# Patient Record
Sex: Male | Born: 1950
Health system: Southern US, Community
[De-identification: ages and names within clinical notes are randomized; demographics above are authoritative.]

## PROBLEM LIST (undated history)

## (undated) DIAGNOSIS — C61 Malignant neoplasm of prostate: Secondary | ICD-10-CM

## (undated) DIAGNOSIS — Z923 Personal history of irradiation: Secondary | ICD-10-CM

## (undated) DIAGNOSIS — N529 Male erectile dysfunction, unspecified: Secondary | ICD-10-CM

## (undated) DIAGNOSIS — E039 Hypothyroidism, unspecified: Secondary | ICD-10-CM

## (undated) DIAGNOSIS — Z951 Presence of aortocoronary bypass graft: Secondary | ICD-10-CM

## (undated) DIAGNOSIS — M1990 Unspecified osteoarthritis, unspecified site: Secondary | ICD-10-CM

## (undated) DIAGNOSIS — I5189 Other ill-defined heart diseases: Secondary | ICD-10-CM

## (undated) DIAGNOSIS — N393 Stress incontinence (female) (male): Secondary | ICD-10-CM

## (undated) DIAGNOSIS — R519 Headache, unspecified: Secondary | ICD-10-CM

## (undated) DIAGNOSIS — K219 Gastro-esophageal reflux disease without esophagitis: Secondary | ICD-10-CM

## (undated) DIAGNOSIS — K5790 Diverticulosis of intestine, part unspecified, without perforation or abscess without bleeding: Secondary | ICD-10-CM

## (undated) DIAGNOSIS — N200 Calculus of kidney: Secondary | ICD-10-CM

## (undated) DIAGNOSIS — Z8719 Personal history of other diseases of the digestive system: Secondary | ICD-10-CM

## (undated) DIAGNOSIS — K635 Polyp of colon: Secondary | ICD-10-CM

## (undated) DIAGNOSIS — R011 Cardiac murmur, unspecified: Secondary | ICD-10-CM

## (undated) DIAGNOSIS — I10 Essential (primary) hypertension: Secondary | ICD-10-CM

## (undated) DIAGNOSIS — Z973 Presence of spectacles and contact lenses: Secondary | ICD-10-CM

## (undated) DIAGNOSIS — R351 Nocturia: Secondary | ICD-10-CM

## (undated) DIAGNOSIS — E785 Hyperlipidemia, unspecified: Secondary | ICD-10-CM

## (undated) DIAGNOSIS — J189 Pneumonia, unspecified organism: Secondary | ICD-10-CM

## (undated) DIAGNOSIS — Z7982 Long term (current) use of aspirin: Secondary | ICD-10-CM

## (undated) DIAGNOSIS — U071 COVID-19: Secondary | ICD-10-CM

## (undated) DIAGNOSIS — Z952 Presence of prosthetic heart valve: Secondary | ICD-10-CM

## (undated) DIAGNOSIS — I350 Nonrheumatic aortic (valve) stenosis: Secondary | ICD-10-CM

## (undated) DIAGNOSIS — I700 Atherosclerosis of aorta: Secondary | ICD-10-CM

## (undated) DIAGNOSIS — I7122 Aneurysm of the aortic arch, without rupture: Secondary | ICD-10-CM

## (undated) DIAGNOSIS — I2510 Atherosclerotic heart disease of native coronary artery without angina pectoris: Secondary | ICD-10-CM

## (undated) DIAGNOSIS — T8859XA Other complications of anesthesia, initial encounter: Secondary | ICD-10-CM

## (undated) HISTORY — PX: TONSILLECTOMY: SUR1361

## (undated) HISTORY — PX: COLONOSCOPY: SHX174

## (undated) HISTORY — PX: OTHER SURGICAL HISTORY: SHX169

## (undated) HISTORY — DX: Hypothyroidism, unspecified: E03.9

## (undated) HISTORY — DX: Hyperlipidemia, unspecified: E78.5

## (undated) HISTORY — PX: TONSILLECTOMY AND ADENOIDECTOMY: SUR1326

---
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
## 1981-02-09 HISTORY — PX: VARICOCELECTOMY: SHX1084

## 2004-08-28 ENCOUNTER — Emergency Department (HOSPITAL_COMMUNITY): Admission: EM | Admit: 2004-08-28 | Discharge: 2004-08-28 | Payer: Self-pay | Admitting: Emergency Medicine

## 2006-09-06 ENCOUNTER — Ambulatory Visit: Payer: Self-pay | Admitting: Gastroenterology

## 2012-02-10 DIAGNOSIS — I350 Nonrheumatic aortic (valve) stenosis: Secondary | ICD-10-CM

## 2012-02-10 HISTORY — DX: Nonrheumatic aortic (valve) stenosis: I35.0

## 2012-08-23 DIAGNOSIS — I2510 Atherosclerotic heart disease of native coronary artery without angina pectoris: Secondary | ICD-10-CM

## 2012-08-23 HISTORY — DX: Atherosclerotic heart disease of native coronary artery without angina pectoris: I25.10

## 2013-06-13 ENCOUNTER — Ambulatory Visit (INDEPENDENT_AMBULATORY_CARE_PROVIDER_SITE_OTHER): Payer: Managed Care, Other (non HMO) | Admitting: Urology

## 2013-06-13 DIAGNOSIS — R972 Elevated prostate specific antigen [PSA]: Secondary | ICD-10-CM

## 2013-06-13 DIAGNOSIS — N529 Male erectile dysfunction, unspecified: Secondary | ICD-10-CM

## 2013-07-04 ENCOUNTER — Other Ambulatory Visit: Payer: Self-pay | Admitting: Urology

## 2013-07-04 DIAGNOSIS — R972 Elevated prostate specific antigen [PSA]: Secondary | ICD-10-CM

## 2013-07-14 ENCOUNTER — Ambulatory Visit: Payer: Self-pay | Admitting: Gastroenterology

## 2013-07-17 LAB — PATHOLOGY REPORT

## 2013-08-01 ENCOUNTER — Ambulatory Visit (HOSPITAL_COMMUNITY)
Admission: RE | Admit: 2013-08-01 | Discharge: 2013-08-01 | Disposition: A | Discharge status: Home / Self Care | Payer: Managed Care, Other (non HMO) | Source: Ambulatory Visit | Attending: Urology | Admitting: Urology

## 2013-08-01 ENCOUNTER — Encounter (HOSPITAL_COMMUNITY): Payer: Self-pay

## 2013-08-01 ENCOUNTER — Other Ambulatory Visit: Payer: Self-pay | Admitting: Urology

## 2013-08-01 DIAGNOSIS — R972 Elevated prostate specific antigen [PSA]: Secondary | ICD-10-CM

## 2013-08-01 DIAGNOSIS — C61 Malignant neoplasm of prostate: Secondary | ICD-10-CM

## 2013-08-01 HISTORY — DX: Essential (primary) hypertension: I10

## 2013-08-01 HISTORY — DX: Malignant neoplasm of prostate: C61

## 2013-08-01 MED ORDER — GENTAMICIN SULFATE 40 MG/ML IJ SOLN
160.0000 mg | Freq: Once | INTRAMUSCULAR | Status: AC
  Administered 2013-08-01: 160 mg via INTRAMUSCULAR
  Filled 2013-08-01: qty 4

## 2013-08-01 MED ORDER — LIDOCAINE HCL (PF) 2 % IJ SOLN
10.0000 mL | Freq: Once | INTRAMUSCULAR | Status: AC
  Administered 2013-08-01: 10 mL

## 2013-08-01 MED ORDER — GENTAMICIN SULFATE 40 MG/ML IJ SOLN
INTRAMUSCULAR | Status: AC
  Administered 2013-08-01: 160 mg via INTRAMUSCULAR
  Filled 2013-08-01: qty 4

## 2013-08-01 MED ORDER — LIDOCAINE HCL (PF) 2 % IJ SOLN
INTRAMUSCULAR | Status: AC
  Administered 2013-08-01: 10 mL
  Filled 2013-08-01: qty 10

## 2013-08-01 NOTE — Progress Notes (Signed)
Biopsy complete no signs of distress  

## 2013-08-01 NOTE — Discharge Instructions (Signed)
Transrectal Ultrasound-Guided Biopsy °A transrectal ultrasound-guided biopsy is a procedure to take samples of tissue from your prostate. Ultrasound images are used to guide the procedure. It is usually done to check the prostate gland for cancer. °BEFORE THE PROCEDURE °· Do not eat or drink after midnight on the night before your procedure. °· Take medicines as your doctor tells you. °· Your doctor may have you stop taking some medicines 5-7 days before the procedure. °· You will be given an enema before your procedure. During an enema, a liquid is put into your butt (rectum) to clear out waste. °· You may have lab tests the day of your procedure. °· Make plans to have someone drive you home. °PROCEDURE °· You will be given medicine to help you relax before the procedure. An IV tube will be put into one of your veins. It will be used to give fluids and medicine. °· You will be given medicine to reduce the risk of infection (antibiotic). °· You will be placed on your side. °· A probe with gel will be put in your butt. This is used to take pictures of your prostate and the area around it. °· A medicine to numb the area is put into your prostate. °· A biopsy needle is then inserted and guided to your prostate. °· Samples of prostate tissue are taken. The needle is removed. °· The samples are sent to a lab to be checked. Results are usually back in 2-3 days. °AFTER THE PROCEDURE °· You will be taken to a room where you will be watched until you are doing okay. °· You may have some pain in the area around your butt. You will be given medicines for this. °· You may be able to go home the same day. Sometimes, an overnight stay in the hospital is needed. °Document Released: 01/14/2009 Document Revised: 01/31/2013 Document Reviewed: 09/14/2012 °ExitCare® Patient Information ©2015 [employer]. This information is not intended to replace advice given to you by your health care provider. Make sure you discuss any questions  you have with your health care provider. ° °

## 2013-08-08 ENCOUNTER — Encounter: Payer: Self-pay | Admitting: Urology

## 2013-09-14 ENCOUNTER — Ambulatory Visit: Payer: Managed Care, Other (non HMO)

## 2013-09-14 ENCOUNTER — Ambulatory Visit: Payer: Managed Care, Other (non HMO) | Admitting: Radiation Oncology

## 2013-09-21 ENCOUNTER — Other Ambulatory Visit: Payer: Self-pay | Admitting: Urology

## 2013-10-09 ENCOUNTER — Encounter (HOSPITAL_COMMUNITY): Payer: Self-pay | Admitting: Pharmacy Technician

## 2013-10-11 NOTE — Patient Instructions (Addendum)
Danny Odom  10/11/2013   Your procedure is scheduled on:  10/20/2013    Report to [facility].  Follow the Signs to [place] at    0630    am  Call this number if you have problems the morning of surgery: [phone]   Remember:   Do not eat food or drink liquids after midnight.   Take these medicines the morning of surgery with A SIP OF WATER: none    Do not wear jewelry,   Do not wear lotions, powders, or perfumes. , deodorant.    . Men may shave face and neck.  Do not bring valuables to the hospital.  Contacts, dentures or bridgework may not be worn into surgery.  Leave suitcase in the car. After surgery it may be brought to your room.  For patients admitted to the hospital, checkout time is 11:00 AM the day of  discharge.           Please read over the following fact sheets that you were given: [facility] - Preparing for Surgery Before surgery, you can play an important role.  Because skin is not sterile, your skin needs to be as free of germs as possible.  You can reduce the number of germs on your skin by washing with CHG (chlorahexidine gluconate) soap before surgery.  CHG is an antiseptic cleaner which kills germs and bonds with the skin to continue killing germs even after washing. Please DO NOT use if you have an allergy to CHG or antibacterial soaps.  If your skin becomes reddened/irritated stop using the CHG and inform your nurse when you arrive at Short Stay. Do not shave (including legs and underarms) for at least 48 hours prior to the first CHG shower.  You may shave your face/neck. Please follow these instructions carefully:  1.  Shower with CHG Soap the night before surgery and the  morning of Surgery.  2.  If you choose to wash your hair, wash your hair first as usual with your  normal  shampoo.  3.  After you shampoo, rinse your hair and body thoroughly to remove the  shampoo.                           4.  Use CHG as you would any other liquid  soap.  You can apply chg directly  to the skin and wash                       Gently with a scrungie or clean washcloth.  5.  Apply the CHG Soap to your body ONLY FROM THE NECK DOWN.   Do not use on face/ open                           Wound or open sores. Avoid contact with eyes, ears mouth and genitals (private parts).                       Wash face,  Genitals (private parts) with your normal soap.             6.  Wash thoroughly, paying special attention to the area where your surgery  will be performed.  7.  Thoroughly rinse your body with warm water from the neck down.  8.  DO NOT shower/wash with your normal soap  after using and rinsing off  the CHG Soap.                9.  Pat yourself dry with a clean towel.            10.  Wear clean pajamas.            11.  Place clean sheets on your bed the night of your first shower and do not  sleep with pets. Day of Surgery : Do not apply any lotions/deodorants the morning of surgery.  Please wear clean clothes to the hospital/surgery center.  FAILURE TO FOLLOW THESE INSTRUCTIONS MAY RESULT IN THE CANCELLATION OF YOUR SURGERY PATIENT SIGNATURE_________________________________  NURSE SIGNATURE__________________________________  ________________________________________________________________________  WHAT IS A BLOOD TRANSFUSION? Blood Transfusion Information  A transfusion is the replacement of blood or some of its parts. Blood is made up of multiple cells which provide different functions.  Red blood cells carry oxygen and are used for blood loss replacement.  White blood cells fight against infection.  Platelets control bleeding.  Plasma helps clot blood.  Other blood products are available for specialized needs, such as hemophilia or other clotting disorders. BEFORE THE TRANSFUSION  Who gives blood for transfusions?   Healthy volunteers who are fully evaluated to make sure their blood is safe. This is blood bank  blood. Transfusion therapy is the safest it has ever been in the practice of medicine. Before blood is taken from a donor, a complete history is taken to make sure that person has no history of diseases nor engages in risky social behavior (examples are intravenous drug use or sexual activity with multiple partners). The donor's travel history is screened to minimize risk of transmitting infections, such as malaria. The donated blood is tested for signs of infectious diseases, such as HIV and hepatitis. The blood is then tested to be sure it is compatible with you in order to minimize the chance of a transfusion reaction. If you or a relative donates blood, this is often done in anticipation of surgery and is not appropriate for emergency situations. It takes many days to process the donated blood. RISKS AND COMPLICATIONS Although transfusion therapy is very safe and saves many lives, the main dangers of transfusion include:   Getting an infectious disease.  Developing a transfusion reaction. This is an allergic reaction to something in the blood you were given. Every precaution is taken to prevent this. The decision to have a blood transfusion has been considered carefully by your caregiver before blood is given. Blood is not given unless the benefits outweigh the risks. AFTER THE TRANSFUSION  Right after receiving a blood transfusion, you will usually feel much better and more energetic. This is especially true if your red blood cells have gotten low (anemic). The transfusion raises the level of the red blood cells which carry oxygen, and this usually causes an energy increase.  The nurse administering the transfusion will monitor you carefully for complications. HOME CARE INSTRUCTIONS  No special instructions are needed after a transfusion. You may find your energy is better. Speak with your caregiver about any limitations on activity for underlying diseases you may have. SEEK MEDICAL CARE IF:    Your condition is not improving after your transfusion.  You develop redness or irritation at the intravenous (IV) site. SEEK IMMEDIATE MEDICAL CARE IF:  Any of the following symptoms occur over the next 12 hours:  Shaking chills.  You have a temperature by mouth above 102  F (38.9 C), not controlled by medicine.  Chest, back, or muscle pain.  People around you feel you are not acting correctly or are confused.  Shortness of breath or difficulty breathing.  Dizziness and fainting.  You get a rash or develop hives.  You have a decrease in urine output.  Your urine turns a dark color or changes to pink, red, or brown. Any of the following symptoms occur over the next 10 days:  You have a temperature by mouth above 102 F (38.9 C), not controlled by medicine.  Shortness of breath.  Weakness after normal activity.  The white part of the eye turns yellow (jaundice).  You have a decrease in the amount of urine or are urinating less often.  Your urine turns a dark color or changes to pink, red, or brown. Document Released: 01/24/2000 Document Revised: 04/20/2011 Document Reviewed: 09/12/2007 ExitCare Patient Information 2014 [place].  _______________________________________________________________________, coughing and deep breathing exercises, leg exercises

## 2013-10-12 ENCOUNTER — Encounter (HOSPITAL_COMMUNITY)
Admission: RE | Admit: 2013-10-12 | Discharge: 2013-10-12 | Disposition: A | Discharge status: Home / Self Care | Payer: Managed Care, Other (non HMO) | Source: Ambulatory Visit | Attending: Urology | Admitting: Urology

## 2013-10-12 ENCOUNTER — Ambulatory Visit (HOSPITAL_COMMUNITY)
Admission: RE | Admit: 2013-10-12 | Discharge: 2013-10-12 | Disposition: A | Discharge status: Home / Self Care | Payer: Managed Care, Other (non HMO) | Source: Ambulatory Visit | Attending: Anesthesiology | Admitting: Anesthesiology

## 2013-10-12 ENCOUNTER — Encounter (INDEPENDENT_AMBULATORY_CARE_PROVIDER_SITE_OTHER): Payer: Self-pay

## 2013-10-12 ENCOUNTER — Encounter (HOSPITAL_COMMUNITY): Payer: Self-pay

## 2013-10-12 DIAGNOSIS — Z87891 Personal history of nicotine dependence: Secondary | ICD-10-CM | POA: Insufficient documentation

## 2013-10-12 DIAGNOSIS — I10 Essential (primary) hypertension: Secondary | ICD-10-CM | POA: Diagnosis present

## 2013-10-12 DIAGNOSIS — Z01818 Encounter for other preprocedural examination: Secondary | ICD-10-CM | POA: Insufficient documentation

## 2013-10-12 HISTORY — DX: Nonrheumatic aortic (valve) stenosis: I35.0

## 2013-10-12 HISTORY — DX: Unspecified osteoarthritis, unspecified site: M19.90

## 2013-10-12 LAB — BASIC METABOLIC PANEL
Anion gap: 12 (ref 5–15)
BUN: 21 mg/dL (ref 6–23)
CO2: 28 meq/L (ref 19–32)
Calcium: 10 mg/dL (ref 8.4–10.5)
Chloride: 103 mEq/L (ref 96–112)
Creatinine, Ser: 1.37 mg/dL — ABNORMAL HIGH (ref 0.50–1.35)
GFR calc Af Amer: 61 mL/min — ABNORMAL LOW (ref >90)
GFR calc non Af Amer: 53 mL/min — ABNORMAL LOW (ref >90)
GLUCOSE: 92 mg/dL (ref 70–99)
POTASSIUM: 5.2 meq/L (ref 3.7–5.3)
SODIUM: 143 meq/L (ref 137–147)

## 2013-10-12 LAB — CBC
HCT: 45 % (ref 39.0–52.0)
HEMOGLOBIN: 14.8 g/dL (ref 13.0–17.0)
MCH: 31.2 pg (ref 26.0–34.0)
MCHC: 32.9 g/dL (ref 30.0–36.0)
MCV: 94.9 fL (ref 78.0–100.0)
Platelets: 214 10*3/uL (ref 150–400)
RBC: 4.74 MIL/uL (ref 4.22–5.81)
RDW: 13.2 % (ref 11.5–15.5)
WBC: 7.2 10*3/uL (ref 4.0–10.5)

## 2013-10-12 NOTE — Progress Notes (Signed)
Rerequested EKG from Dr Emily Filbert office by fax.

## 2013-10-12 NOTE — Progress Notes (Signed)
LOV Dr Emily Filbert 09/25/13 on chart  LOV Dr Jenne Pane [facility]- 05/2012 in [place]

## 2013-10-12 NOTE — Progress Notes (Signed)
CXR sent via EPIC fax to Dr Tresa Moore.

## 2013-10-12 NOTE — Progress Notes (Signed)
Requested ekg from [facility] since patient states Dr Tresa Moore has a copy.  Left message for [employer].  Also called and left message for medical records department of Dr Emily Filbert and requested copy of ekg be faxed to [phone].  Also requested by fax this am.

## 2013-10-12 NOTE — Progress Notes (Signed)
Faxed via EPIC BMP results. To Dr Tresa Moore

## 2013-10-13 ENCOUNTER — Ambulatory Visit (HOSPITAL_COMMUNITY)
Admission: RE | Admit: 2013-10-13 | Discharge: 2013-10-13 | Disposition: A | Discharge status: Home / Self Care | Payer: Managed Care, Other (non HMO) | Source: Ambulatory Visit | Attending: Urology | Admitting: Urology

## 2013-10-13 ENCOUNTER — Other Ambulatory Visit (HOSPITAL_COMMUNITY): Payer: Self-pay | Admitting: Urology

## 2013-10-13 DIAGNOSIS — C61 Malignant neoplasm of prostate: Secondary | ICD-10-CM

## 2013-10-13 NOTE — Progress Notes (Signed)
ekg [facility] system 09-25-13 on chart

## 2013-10-19 NOTE — Anesthesia Preprocedure Evaluation (Addendum)
Anesthesia Evaluation  Patient identified by MRN, date of birth, ID band Patient awake    Reviewed: Allergy & Precautions, H&P , NPO status , Patient's Chart, lab work & pertinent test results  Airway Mallampati: II TM Distance: >3 FB Neck ROM: Full    Dental no notable dental hx.    Pulmonary neg pulmonary ROS, former smoker,  breath sounds clear to auscultation  Pulmonary exam normal       Cardiovascular hypertension, Pt. on medications + Valvular Problems/Murmurs AS Rhythm:Regular Rate:Bradycardia + Systolic murmurs EF normal. No SOB, syncope, or angina   Neuro/Psych negative neurological ROS  negative psych ROS   GI/Hepatic negative GI ROS, Neg liver ROS,   Endo/Other  negative endocrine ROS  Renal/GU negative Renal ROS  negative genitourinary   Musculoskeletal negative musculoskeletal ROS (+)   Abdominal   Peds negative pediatric ROS (+)  Hematology negative hematology ROS (+)   Anesthesia Other Findings   Reproductive/Obstetrics negative OB ROS                        Anesthesia Physical Anesthesia Plan  ASA: III  Anesthesia Plan: General   Post-op Pain Management:    Induction: Intravenous  Airway Management Planned: Oral ETT  Additional Equipment:   Intra-op Plan:   Post-operative Plan: Extubation in OR  Informed Consent: I have reviewed the patients History and Physical, chart, labs and discussed the procedure including the risks, benefits and alternatives for the proposed anesthesia with the patient or authorized representative who has indicated his/her understanding and acceptance.   Dental advisory given  Plan Discussed with: CRNA  Anesthesia Plan Comments:        Anesthesia Quick Evaluation

## 2013-10-20 ENCOUNTER — Encounter (HOSPITAL_COMMUNITY): Payer: Managed Care, Other (non HMO) | Admitting: Anesthesiology

## 2013-10-20 ENCOUNTER — Inpatient Hospital Stay (HOSPITAL_COMMUNITY)
Admission: RE | Admit: 2013-10-20 | Discharge: 2013-10-21 | DRG: 708 | Disposition: A | Discharge status: Home / Self Care | Payer: Managed Care, Other (non HMO) | Source: Ambulatory Visit | Attending: Urology | Admitting: Urology

## 2013-10-20 ENCOUNTER — Encounter (HOSPITAL_COMMUNITY)
Admission: RE | Disposition: A | Discharge status: Home / Self Care | Payer: Self-pay | Source: Ambulatory Visit | Attending: Urology

## 2013-10-20 ENCOUNTER — Inpatient Hospital Stay (HOSPITAL_COMMUNITY): Payer: Managed Care, Other (non HMO) | Admitting: Anesthesiology

## 2013-10-20 ENCOUNTER — Encounter (HOSPITAL_COMMUNITY): Payer: Self-pay | Admitting: *Deleted

## 2013-10-20 DIAGNOSIS — I10 Essential (primary) hypertension: Secondary | ICD-10-CM | POA: Diagnosis present

## 2013-10-20 DIAGNOSIS — I359 Nonrheumatic aortic valve disorder, unspecified: Secondary | ICD-10-CM | POA: Diagnosis present

## 2013-10-20 DIAGNOSIS — Z87891 Personal history of nicotine dependence: Secondary | ICD-10-CM | POA: Diagnosis not present

## 2013-10-20 DIAGNOSIS — C61 Malignant neoplasm of prostate: Principal | ICD-10-CM | POA: Diagnosis present

## 2013-10-20 HISTORY — PX: ROBOT ASSISTED LAPAROSCOPIC RADICAL PROSTATECTOMY: SHX5141

## 2013-10-20 HISTORY — PX: LYMPHADENECTOMY: SHX5960

## 2013-10-20 LAB — HEMOGLOBIN AND HEMATOCRIT, BLOOD
HCT: 41.6 % (ref 39.0–52.0)
HEMOGLOBIN: 13.6 g/dL (ref 13.0–17.0)

## 2013-10-20 LAB — TYPE AND SCREEN
ABO/RH(D): O POS
Antibody Screen: NEGATIVE

## 2013-10-20 LAB — ABO/RH: ABO/RH(D): O POS

## 2013-10-20 SURGERY — ROBOTIC ASSISTED LAPAROSCOPIC RADICAL PROSTATECTOMY
Anesthesia: General

## 2013-10-20 MED ORDER — PROPOFOL 10 MG/ML IV BOLUS
INTRAVENOUS | Status: AC
  Filled 2013-10-20: qty 20

## 2013-10-20 MED ORDER — NEOSTIGMINE METHYLSULFATE 10 MG/10ML IV SOLN
INTRAVENOUS | Status: DC | PRN
  Administered 2013-10-20: 4 mg via INTRAVENOUS

## 2013-10-20 MED ORDER — AMLODIPINE BESYLATE 2.5 MG PO TABS
2.5000 mg | ORAL_TABLET | Freq: Every day | ORAL | Status: DC
  Administered 2013-10-20 – 2013-10-21 (×2): 2.5 mg via ORAL
  Filled 2013-10-20 (×2): qty 1

## 2013-10-20 MED ORDER — PROPOFOL 10 MG/ML IV BOLUS
INTRAVENOUS | Status: DC | PRN
  Administered 2013-10-20: 200 mg via INTRAVENOUS

## 2013-10-20 MED ORDER — BUPIVACAINE LIPOSOME 1.3 % IJ SUSP
20.0000 mL | Freq: Once | INTRAMUSCULAR | Status: AC
  Administered 2013-10-20: 16 mL
  Filled 2013-10-20: qty 20

## 2013-10-20 MED ORDER — HYDROMORPHONE HCL PF 1 MG/ML IJ SOLN: 0.5000 mg | INTRAMUSCULAR | Status: DC | PRN

## 2013-10-20 MED ORDER — ONDANSETRON HCL 4 MG/2ML IJ SOLN
INTRAMUSCULAR | Status: AC
  Filled 2013-10-20: qty 2

## 2013-10-20 MED ORDER — LIDOCAINE HCL (CARDIAC) 20 MG/ML IV SOLN
INTRAVENOUS | Status: DC | PRN
  Administered 2013-10-20: 25 mg via INTRATRACHEAL
  Administered 2013-10-20: 75 mg via INTRAVENOUS

## 2013-10-20 MED ORDER — MIDAZOLAM HCL 5 MG/5ML IJ SOLN
INTRAMUSCULAR | Status: DC | PRN
  Administered 2013-10-20: 2 mg via INTRAVENOUS

## 2013-10-20 MED ORDER — ROCURONIUM BROMIDE 100 MG/10ML IV SOLN
INTRAVENOUS | Status: AC
Start: 2013-10-20 — End: 2013-10-20
  Filled 2013-10-20: qty 1

## 2013-10-20 MED ORDER — MIDAZOLAM HCL 2 MG/2ML IJ SOLN
INTRAMUSCULAR | Status: AC
  Filled 2013-10-20: qty 2

## 2013-10-20 MED ORDER — OXYCODONE-ACETAMINOPHEN 5-325 MG PO TABS: 1.0000 | ORAL_TABLET | ORAL | Status: DC | PRN

## 2013-10-20 MED ORDER — LACTATED RINGERS IR SOLN
Status: DC | PRN
  Administered 2013-10-20: 500 mL

## 2013-10-20 MED ORDER — HYDROCODONE-ACETAMINOPHEN 5-325 MG PO TABS: 1.0000 | ORAL_TABLET | Freq: Four times a day (QID) | ORAL | Status: DC | PRN

## 2013-10-20 MED ORDER — ROCURONIUM BROMIDE 100 MG/10ML IV SOLN
INTRAVENOUS | Status: DC | PRN
  Administered 2013-10-20: 10 mg via INTRAVENOUS
  Administered 2013-10-20: 20 mg via INTRAVENOUS
  Administered 2013-10-20: 70 mg via INTRAVENOUS
  Administered 2013-10-20: 10 mg via INTRAVENOUS

## 2013-10-20 MED ORDER — LIDOCAINE HCL (CARDIAC) 20 MG/ML IV SOLN
INTRAVENOUS | Status: AC
  Filled 2013-10-20: qty 5

## 2013-10-20 MED ORDER — EPHEDRINE SULFATE 50 MG/ML IJ SOLN
INTRAMUSCULAR | Status: DC | PRN
  Administered 2013-10-20: 5 mg via INTRAVENOUS

## 2013-10-20 MED ORDER — HYDROMORPHONE HCL PF 1 MG/ML IJ SOLN: 0.2500 mg | INTRAMUSCULAR | Status: DC | PRN

## 2013-10-20 MED ORDER — LACTATED RINGERS IV SOLN
INTRAVENOUS | Status: DC | PRN
  Administered 2013-10-20 (×2): via INTRAVENOUS

## 2013-10-20 MED ORDER — SODIUM CHLORIDE 0.9 % IJ SOLN
INTRAMUSCULAR | Status: AC
  Filled 2013-10-20: qty 10

## 2013-10-20 MED ORDER — PROMETHAZINE HCL 25 MG/ML IJ SOLN
6.2500 mg | INTRAMUSCULAR | Status: DC | PRN
Start: 2013-10-20 — End: 2013-10-20

## 2013-10-20 MED ORDER — GLYCOPYRROLATE 0.2 MG/ML IJ SOLN
INTRAMUSCULAR | Status: AC
  Filled 2013-10-20: qty 3

## 2013-10-20 MED ORDER — CEFAZOLIN SODIUM-DEXTROSE 2-3 GM-% IV SOLR
INTRAVENOUS | Status: AC
  Filled 2013-10-20: qty 50

## 2013-10-20 MED ORDER — GLYCOPYRROLATE 0.2 MG/ML IJ SOLN
INTRAMUSCULAR | Status: DC | PRN
  Administered 2013-10-20: 0.6 mg via INTRAVENOUS

## 2013-10-20 MED ORDER — ACETAMINOPHEN 10 MG/ML IV SOLN
1000.0000 mg | Freq: Once | INTRAVENOUS | Status: DC
  Filled 2013-10-20: qty 100

## 2013-10-20 MED ORDER — OXYCODONE HCL 5 MG PO TABS: 5.0000 mg | ORAL_TABLET | ORAL | Status: DC | PRN

## 2013-10-20 MED ORDER — KETOROLAC TROMETHAMINE 30 MG/ML IJ SOLN: 15.0000 mg | Freq: Once | INTRAMUSCULAR | Status: DC | PRN

## 2013-10-20 MED ORDER — LACTATED RINGERS IV SOLN
INTRAVENOUS | Status: DC
  Administered 2013-10-20: 1000 mL via INTRAVENOUS

## 2013-10-20 MED ORDER — SUFENTANIL CITRATE 50 MCG/ML IV SOLN
INTRAVENOUS | Status: AC
  Filled 2013-10-20: qty 1

## 2013-10-20 MED ORDER — SODIUM CHLORIDE 0.9 % IV BOLUS (SEPSIS)
1000.0000 mL | Freq: Once | INTRAVENOUS | Status: AC
  Administered 2013-10-20: 1000 mL via INTRAVENOUS

## 2013-10-20 MED ORDER — DEXAMETHASONE SODIUM PHOSPHATE 10 MG/ML IJ SOLN
INTRAMUSCULAR | Status: AC
  Filled 2013-10-20: qty 1

## 2013-10-20 MED ORDER — CEFAZOLIN SODIUM-DEXTROSE 2-3 GM-% IV SOLR
2.0000 g | INTRAVENOUS | Status: AC
  Administered 2013-10-20: 2 g via INTRAVENOUS

## 2013-10-20 MED ORDER — HYDROMORPHONE HCL PF 2 MG/ML IJ SOLN
INTRAMUSCULAR | Status: AC
  Filled 2013-10-20: qty 1

## 2013-10-20 MED ORDER — EPHEDRINE SULFATE 50 MG/ML IJ SOLN
INTRAMUSCULAR | Status: AC
  Filled 2013-10-20: qty 1

## 2013-10-20 MED ORDER — BENAZEPRIL HCL 10 MG PO TABS
10.0000 mg | ORAL_TABLET | Freq: Every day | ORAL | Status: DC
  Administered 2013-10-20 – 2013-10-21 (×2): 10 mg via ORAL
  Filled 2013-10-20 (×2): qty 1

## 2013-10-20 MED ORDER — NEOSTIGMINE METHYLSULFATE 10 MG/10ML IV SOLN
INTRAVENOUS | Status: AC
  Filled 2013-10-20: qty 1

## 2013-10-20 MED ORDER — CIPROFLOXACIN HCL 500 MG PO TABS: 500.0000 mg | ORAL_TABLET | Freq: Two times a day (BID) | ORAL | Status: DC

## 2013-10-20 MED ORDER — ACETAMINOPHEN 10 MG/ML IV SOLN
INTRAVENOUS | Status: DC | PRN
  Administered 2013-10-20: 1000 mg via INTRAVENOUS

## 2013-10-20 MED ORDER — AMLODIPINE BESY-BENAZEPRIL HCL 2.5-10 MG PO CAPS: 1.0000 | ORAL_CAPSULE | Freq: Every morning | ORAL | Status: DC

## 2013-10-20 MED ORDER — INFLUENZA VAC SPLIT QUAD 0.5 ML IM SUSY
0.5000 mL | PREFILLED_SYRINGE | INTRAMUSCULAR | Status: AC
  Administered 2013-10-21: 0.5 mL via INTRAMUSCULAR
  Filled 2013-10-20 (×2): qty 0.5

## 2013-10-20 MED ORDER — HYDROMORPHONE HCL PF 1 MG/ML IJ SOLN
INTRAMUSCULAR | Status: DC | PRN
  Administered 2013-10-20 (×2): 1 mg via INTRAVENOUS

## 2013-10-20 MED ORDER — DEXTROSE-NACL 5-0.45 % IV SOLN
INTRAVENOUS | Status: DC
  Administered 2013-10-20: 20:00:00 via INTRAVENOUS
  Administered 2013-10-20: 1000 mL via INTRAVENOUS
  Administered 2013-10-21 (×2): via INTRAVENOUS

## 2013-10-20 MED ORDER — ACETAMINOPHEN 500 MG PO TABS
1000.0000 mg | ORAL_TABLET | Freq: Four times a day (QID) | ORAL | Status: DC
  Administered 2013-10-20 – 2013-10-21 (×3): 1000 mg via ORAL
  Filled 2013-10-20 (×6): qty 2

## 2013-10-20 MED ORDER — DEXAMETHASONE SODIUM PHOSPHATE 10 MG/ML IJ SOLN
INTRAMUSCULAR | Status: DC | PRN
  Administered 2013-10-20: 10 mg via INTRAVENOUS

## 2013-10-20 MED ORDER — SUFENTANIL CITRATE 50 MCG/ML IV SOLN
INTRAVENOUS | Status: DC | PRN
  Administered 2013-10-20: 10 ug via INTRAVENOUS
  Administered 2013-10-20: 15 ug via INTRAVENOUS
  Administered 2013-10-20 (×2): 10 ug via INTRAVENOUS
  Administered 2013-10-20 (×2): 5 ug via INTRAVENOUS
  Administered 2013-10-20 (×2): 10 ug via INTRAVENOUS
  Administered 2013-10-20: 5 ug via INTRAVENOUS

## 2013-10-20 SURGICAL SUPPLY — 53 items
ADH SKN CLS APL DERMABOND .7 (GAUZE/BANDAGES/DRESSINGS) ×2
CABLE HIGH FREQUENCY MONO STRZ (ELECTRODE) ×3 IMPLANT
CANISTER SUCTION 2500CC (MISCELLANEOUS) ×2 IMPLANT
CATH FOLEY 2WAY SLVR 18FR 30CC (CATHETERS) ×3 IMPLANT
CATH TIEMANN FOLEY 18FR 5CC (CATHETERS) ×3 IMPLANT
CHLORAPREP W/TINT 26ML (MISCELLANEOUS) ×3 IMPLANT
CLIP LIGATING HEM O LOK PURPLE (MISCELLANEOUS) ×8 IMPLANT
CLOTH BEACON ORANGE TIMEOUT ST (SAFETY) ×3 IMPLANT
COVER SURGICAL LIGHT HANDLE (MISCELLANEOUS) ×3 IMPLANT
COVER TIP SHEARS 8 DVNC (MISCELLANEOUS) ×2 IMPLANT
COVER TIP SHEARS 8MM DA VINCI (MISCELLANEOUS) ×1
CUTTER ECHEON FLEX ENDO 45 340 (ENDOMECHANICALS) ×3 IMPLANT
DECANTER SPIKE VIAL GLASS SM (MISCELLANEOUS) ×3 IMPLANT
DERMABOND ADVANCED (GAUZE/BANDAGES/DRESSINGS) ×1
DERMABOND ADVANCED .7 DNX12 (GAUZE/BANDAGES/DRESSINGS) ×2 IMPLANT
DRSG TEGADERM 4X4.75 (GAUZE/BANDAGES/DRESSINGS) ×6 IMPLANT
DRSG TEGADERM 6X8 (GAUZE/BANDAGES/DRESSINGS) ×4 IMPLANT
ELECT REM PT RETURN 9FT ADLT (ELECTROSURGICAL) ×3
ELECTRODE REM PT RTRN 9FT ADLT (ELECTROSURGICAL) ×2 IMPLANT
GAUZE SPONGE 2X2 8PLY STRL LF (GAUZE/BANDAGES/DRESSINGS) ×2 IMPLANT
GLOVE BIO SURGEON STRL SZ 6.5 (GLOVE) ×3 IMPLANT
GLOVE BIOGEL M STRL SZ7.5 (GLOVE) ×6 IMPLANT
GLOVE BIOGEL PI IND STRL 7.5 (GLOVE) IMPLANT
GLOVE BIOGEL PI INDICATOR 7.5 (GLOVE) ×1
GOWN STRL REUS W/TWL LRG LVL4 (GOWN DISPOSABLE) ×9 IMPLANT
HOLDER FOLEY CATH W/STRAP (MISCELLANEOUS) ×3 IMPLANT
IV LACTATED RINGERS 1000ML (IV SOLUTION) ×3 IMPLANT
KIT ACCESSORY DA VINCI DISP (KITS) ×1
KIT ACCESSORY DVNC DISP (KITS) IMPLANT
KIT PROCEDURE DA VINCI SI (MISCELLANEOUS) ×1
KIT PROCEDURE DVNC SI (MISCELLANEOUS) ×2 IMPLANT
NDL INSUFFLATION 14GA 120MM (NEEDLE) ×2 IMPLANT
NEEDLE INSUFFLATION 14GA 120MM (NEEDLE) ×3 IMPLANT
NEEDLE SPNL 22GX7 SPINOC (NEEDLE) IMPLANT
PACK ROBOT UROLOGY CUSTOM (CUSTOM PROCEDURE TRAY) ×3 IMPLANT
RELOAD GREEN ECHELON 45 (STAPLE) ×3 IMPLANT
SET TUBE IRRIG SUCTION NO TIP (IRRIGATION / IRRIGATOR) ×3 IMPLANT
SOLUTION ELECTROLUBE (MISCELLANEOUS) ×3 IMPLANT
SPONGE GAUZE 2X2 STER 10/PKG (GAUZE/BANDAGES/DRESSINGS)
SPONGE LAP 4X18 X RAY DECT (DISPOSABLE) ×3 IMPLANT
SUT ETHILON 3 0 PS 1 (SUTURE) ×3 IMPLANT
SUT MNCRL AB 4-0 PS2 18 (SUTURE) ×6 IMPLANT
SUT PDS AB 1 CT1 27 (SUTURE) ×6 IMPLANT
SUT VIC AB 2-0 SH 27 (SUTURE) ×3
SUT VIC AB 2-0 SH 27X BRD (SUTURE) ×2 IMPLANT
SUT VICRYL 0 UR6 27IN ABS (SUTURE) ×3 IMPLANT
SUT VLOC BARB 180 ABS3/0GR12 (SUTURE) ×9
SUTURE VLOC BRB 180 ABS3/0GR12 (SUTURE) ×6 IMPLANT
SYR 27GX1/2 1ML LL SAFETY (SYRINGE) ×3 IMPLANT
TOWEL OR 17X26 10 PK STRL BLUE (TOWEL DISPOSABLE) ×3 IMPLANT
TOWEL OR NON WOVEN STRL DISP B (DISPOSABLE) ×3 IMPLANT
TROCAR 12M 150ML BLUNT (TROCAR) ×3 IMPLANT
WATER STERILE IRR 1500ML POUR (IV SOLUTION) ×6 IMPLANT

## 2013-10-20 NOTE — H&P (Signed)
Danny Odom is an 63 y.o. male.    Chief Complaint: Pre-OP Radical Prostatectomy  HPI:   1 - Moderate Risk Prostate Cancer -  Gleason 3+4=7 in up to 60% of cores in LLB, RLA + Gleason 6 LLM and several cores PIN by biopsy 07/2013 on eval PSA 7.81. TRUS vol 2mL.   PMH sig for aortic stenosis / aortic insufficiency (follows Danny Odom, [facility], no day to day limitations), rt inguinal varicocelectomy.   Today Danny Odom is seen to proceed with robotic prostatectomy for primary treatment of his malignancy.  Past Medical History  Diagnosis Date  . Hypertension   . Aortic stenosis   . Arthritis     lower spine- 2012  . Cancer     prostate cancer     Past Surgical History  Procedure Laterality Date  . Colonoscopy    . Varicocele surgery     . Tonsillectomy      No family history on file. Social History:  reports that he has quit smoking. He has never used smokeless tobacco. He reports that he drinks alcohol. He reports that he does not use illicit drugs.  Allergies: No Active Allergies  No prescriptions prior to admission    No results found for this or any previous visit (from the past 48 hour(s)). No results found.  Review of Systems  Constitutional: Negative.   HENT: Negative.   Eyes: Negative.   Respiratory: Negative.   Cardiovascular: Negative.   Gastrointestinal: Positive for nausea and vomiting.  Genitourinary: Negative.   Musculoskeletal: Negative.   Skin: Negative.   Neurological: Negative.   Endo/Heme/Allergies: Negative.   Psychiatric/Behavioral: Negative.     There were no vitals taken for this visit. Physical Exam  Constitutional: He is oriented to person, place, and time. He appears well-developed and well-nourished.  HENT:  Head: Normocephalic.  Eyes: Pupils are equal, round, and reactive to light.  Neck: Normal range of motion. Neck supple.  Cardiovascular: Normal rate.   Respiratory: Effort normal.  GI: Soft.  Genitourinary: Penis  normal.  No CVAT  Musculoskeletal: Normal range of motion.  Neurological: He is alert and oriented to person, place, and time.  Skin: Skin is warm and dry.  Psychiatric: He has a normal mood and affect. His behavior is normal. Judgment and thought content normal.     Assessment/Plan   1 - Moderate Risk Prostate Cancer -  Pt adamantly wants surgery. He has DX of AS/AI but performs daily aerobic work-outs, had cards eval approx 1 year ago, but PCP keeps track of valve area per history and has recent clearance last month by PCP. Will proceed with low threshold to abandon or open conversion if any cardiopulmonary issues with pneumoperitoneum or steep Trendelenburg positioning as radiation is viable option as well.  We rediscussed prostatectomy and specifically robotic prostatectomy with bilateral pelvic lymphadenectomy being the technique that I most commonly perform. I showed the patient on their abdomen the approximately 6 small incision (trocar) sites as well as presumed extraction sites with robotic approach as well as possible open incision sites should open conversion be necessary. We rediscussed peri-operative risks including bleeding, infection, deep vein thrombosis, pulmonary embolism, compartment syndrome, nuropathy / neuropraxia, heart attack, stroke, death, as well as long-term risks such as non-cure / need for additional therapy. We specifically readdressed that the procedure would compromise urinary control leading to stress incontinence which typically resolves with time and pelvic rehabilitation (Kegel's, etc..), but can sometimes be permanent and require additional therapy  including surgery. We also specifically readdressed sexual sequellae including significant erectile dysfunction which typically partially resolves with time but can also be permanent and require additional therapy including surgery.   We rediscussed the typical hospital course including usual 1-2 night hospitalization,  discharge with foley catheter in place usually for 1-2 weeks before voiding trial as well as usually 2 week recovery until able to perform most non-strenuous activity and 6 weeks until able to return to most jobs and more strenuous activity such as exercise.   Danny Odom 10/20/2013, 6:32 AM

## 2013-10-20 NOTE — Transfer of Care (Signed)
Immediate Anesthesia Transfer of Care Note  Patient: Danny Odom  Procedure(s) Performed: Procedure(s): ROBOTIC ASSISTED LAPAROSCOPIC RADICAL PROSTATECTOMY WITH INDOCYANINE GREEN DYE (N/A) BILATERAL PELVIC LYMPH NODE DISSECTION (Bilateral)  Patient Location: PACU  Anesthesia Type:General  Level of Consciousness: awake, alert , oriented and patient cooperative  Airway & Oxygen Therapy: Patient Spontanous Breathing and Patient connected to face mask oxygen  Post-op Assessment: Report given to PACU RN, Post -op Vital signs reviewed and stable and Patient moving all extremities X 4  Post vital signs: stable  Complications: No apparent anesthesia complications

## 2013-10-20 NOTE — Brief Op Note (Signed)
10/20/2013  11:36 AM  PATIENT:  Danny Odom  63 y.o. male  PRE-OPERATIVE DIAGNOSIS:  MODERATE RISK PROSTATE CANCER  POST-OPERATIVE DIAGNOSIS:  MODERATE RISK PROSTATE  CANCER  PROCEDURE:  Procedure(s): ROBOTIC ASSISTED LAPAROSCOPIC RADICAL PROSTATECTOMY WITH INDOCYANINE GREEN DYE (N/A) BILATERAL PELVIC LYMPH NODE DISSECTION (Bilateral) Laparoscopic limited adhesiolysis  SURGEON:  Surgeon(s) and Role:    * Alexis Frock, MD - Primary  PHYSICIAN ASSISTANT:   ASSISTANTS: Debbrah Alar, PA   ANESTHESIA:   general  EBL:  Total I/O In: 1000 [I.V.:1000] Out: 300 [Blood:300]  BLOOD ADMINISTERED:none  DRAINS: JP to bulb, Foley to straight drain   LOCAL MEDICATIONS USED:  MARCAINE     SPECIMEN:  Source of Specimen:  anterior urethral margin, bilateral pelvic lymph nodes, left ext. ilian node sentinal, periprostatic fat, prostatectomy  DISPOSITION OF SPECIMEN:  PATHOLOGY  COUNTS:  YES  TOURNIQUET:  * No tourniquets in log *  DICTATION: .Other Dictation: Dictation Number [id number]  PLAN OF CARE: Admit to inpatient   PATIENT DISPOSITION:  PACU - hemodynamically stable.   Delay start of Pharmacological VTE agent (>24hrs) due to surgical blood loss or risk of bleeding: yes

## 2013-10-20 NOTE — Anesthesia Postprocedure Evaluation (Signed)
  Anesthesia Post-op Note  Patient: Danny Odom  Procedure(s) Performed: Procedure(s) (LRB): ROBOTIC ASSISTED LAPAROSCOPIC RADICAL PROSTATECTOMY WITH INDOCYANINE GREEN DYE (N/A) BILATERAL PELVIC LYMPH NODE DISSECTION (Bilateral)  Patient Location: PACU  Anesthesia Type: General  Level of Consciousness: awake and alert   Airway and Oxygen Therapy: Patient Spontanous Breathing  Post-op Pain: mild  Post-op Assessment: Post-op Vital signs reviewed, Patient's Cardiovascular Status Stable, Respiratory Function Stable, Patent Airway and No signs of Nausea or vomiting  Last Vitals:  Filed Vitals:   10/20/13 1245  BP: 124/77  Pulse: 70  Temp: 36.9 C  Resp: 16    Post-op Vital Signs: stable   Complications: No apparent anesthesia complications

## 2013-10-20 NOTE — Discharge Instructions (Signed)

## 2013-10-21 LAB — BASIC METABOLIC PANEL
Anion gap: 9 (ref 5–15)
BUN: 17 mg/dL (ref 6–23)
CO2: 28 mEq/L (ref 19–32)
Calcium: 8.5 mg/dL (ref 8.4–10.5)
Chloride: 105 mEq/L (ref 96–112)
Creatinine, Ser: 1.24 mg/dL (ref 0.50–1.35)
GFR calc Af Amer: 69 mL/min — ABNORMAL LOW (ref >90)
GFR calc non Af Amer: 60 mL/min — ABNORMAL LOW (ref >90)
Glucose, Bld: 108 mg/dL — ABNORMAL HIGH (ref 70–99)
Potassium: 4.4 mEq/L (ref 3.7–5.3)
Sodium: 142 mEq/L (ref 137–147)

## 2013-10-21 LAB — HEMOGLOBIN AND HEMATOCRIT, BLOOD
HCT: 38.7 % — ABNORMAL LOW (ref 39.0–52.0)
Hemoglobin: 12.7 g/dL — ABNORMAL LOW (ref 13.0–17.0)

## 2013-10-21 NOTE — Op Note (Signed)
NAMEMarland Kitchen  Danny, Odom NO.:  [id number]  MEDICAL RECORD NO.:  [id number]  LOCATION:  34                         FACILITY:  [facility]  PHYSICIAN:  Alexis Frock, MD     DATE OF BIRTH:  Aug 18, 1950  DATE OF PROCEDURE: 10/20/2013 DATE OF DISCHARGE:                              OPERATIVE REPORT   DIAGNOSIS:  Moderate risk prostate cancer.  PROCEDURES: 1. Robotic-assisted laparoscopic radical prostatectomy. 2. Bilateral pelvic lymphadenectomy. 3. Injection of indocyanine green dye for sentinel lymphangiography. 4. Laparoscopic limited adhesiolysis.  ASSISTANT:  Leta Baptist, PA  DRAINS: 1. Jackson-Pratt drain to bulb suction. 2. Foley catheter to straight drain.  SPECIMENS: 1. Radical prostatectomy. 2. Periprostatic fat. 3. Distal anterior urethral margin frozen section negative for     carcinoma. 4. Right external iliac lymph nodes. 5. Right obturator lymph nodes. 6. Left external iliac lymph nodes. 7. Left obturator lymph nodes. 8. Left external iliac lymph node, sentinel.  ESTIMATED BLOOD LOSS:  150 mL.  COMPLICATIONS:  None.  FINDINGS: 1. Extensive colonic diverticulosis and sigmoid and descending colonic     adhesions, these required adhesiolysis to gain access to the     pelvis.  Adding approximately 25 minutes to the surgical case     today. 2. Single hyperfluorescent sentinel lymph node in the very proximal     left external iliac lymph node chain, this was just lateral to the     left ureter with hyperfluorescence channels coursing towards this,     no other hyperfluorescent lymph node noted with sentinel     lymphangiography.  INDICATIONS:  Danny Odom is a very pleasant 63 year old gentleman who on workup of elevated PSA, was found to have multifocal moderate risk prostate cancer.  Options were discussed for definitive management including surveillance protocols versus ablative therapies versus surgical extirpation with and without  minimally-invasive assistance and he wished to proceed with the latter.  Informed consent was obtained and placed in the medical record.  PROCEDURE IN DETAIL:  The patient being Danny Odom, was verified. Procedure being robotic prostatectomy was confirmed.  Procedure was carried out.  Time-out was performed.  Intravenous antibiotics were administered.  General endotracheal anesthesia was introduced.  The patient was placed into a low lithotomy position, and sterile field was created by prepping and draping the patient's penis, perineum, and proximal thighs using iodine x3 in his infra-xiphoid abdomen using chlorhexidine gluconate.  He had a shoulder and neck Trendelenburg film positioning apparatus placed and locked into the table and a test of steep Trendelenburg position was performed.  He was found to be suitably positioned, and he was then draped.  Foley catheter was placed per urethra to straight drain.  Next, a high-flow, low-pressure pneumoperitoneum was obtained using Veress technique, having passed the aspiration and drop test.  Robotic 12-mm camera port was placed in the infra-xiphoid location.  Laparoscopic examination of the peritoneal cavity revealed multifocal fairly impressive colonic adhesions mostly in the left hemiabdomen and left lower quadrant.  There was also a significant diverticulosis of the colon without any obvious purulence or evidence of diverticulitis.  The adhesions in the left lower quadrant were concerning for somewhat prior episodes of  diverticulitis.  There was sufficient room to place additional ports as follows; right paramedian 8-mm robotic port, right far lateral 12-mm assistant port, right paramedian 5-mm suction port, left paramedian 8-mm robotic port, left far lateral 8-mm robotic port.  Robot was docked and passed through electronic checks.  Initial attention was directed at adhesiolysis. Very careful adhesiolysis was performed, taking down  multiple attachments between the descending and sigmoid colon and the peritoneum of the pelvis and left abdominal wall.  Following these maneuvers, the colon was then easily swept out of the pelvis and had more anatomic orientation.  Again, this added approximately 25 minutes to the procedure.  There was no evidence of colonic injury during or following these maneuvers.  Next, incision was made lateral to the left medial umbilical ligament from the midline towards the area of the inguinal ring and following the course of the iliac vessels stepping just lateral to the left ureter and the bladder was carefully swept away from the left hemipelvis towards the area of the endopelvic fascia.  The vas deferens was encountered and ligated, and placed on medial direction during this  maneuver as well.  Next, right bladder was also mobilized in similar fashion.  The right vas deferens was transected, thus exposing the right endopelvic fascia.  The anterior attachment was taken down using cautery scissors, but exposing anterior base of the prostate. This area was defatted with this fat pad, set aside, labeled on periprostatic fat.  The bladder neck prostate junction was identified and then the prostate was injected with 0.2 mL of indocyanine green dye using a percutaneously placed 18-gauge Chiba needle with robotic guidance into each lobe of the prostate with intervening section to avoid dye spillage, which did not occur, was interrogated with near- infrared fluorescent light and there were noted to be lymphatic channels coursing over the prostate without dye spillage suggesting success with lymphangiography at this point.  Next, endopelvic fascia was carefully swept away from the lateral aspect of the prostate with base-to-apex orientation bilaterally and this exposed the dorsal venous complex, which was controlled using vascular load stapler, taking great care to avoid injury to the membranous  urethra and this did not occur, and had been approximately 15 minutes post ICG injection and the lymphatic fields were then carefully inspected under near-infrared fluorescent light and this revealed a single sentinel node that appeared to be in the very proximal aspect of the left external iliac lymph node chain just lateral to the left ureter.  The lymphatic channel seen coursing from this location towards the area of the bladder and prostate.  There were no additional lymph nodes seen.  Attention was then directed to the lymphadenectomy.  First on the right side, all fiber fatty tissue with confines of the right external iliac artery.  Vein, pelvic side wall and ureter were carefully mobilized.  Lymphostasis was achieved with cold clips and this was set aside, labeled the right external iliac lymph nodes.  Next, all fiber fatty tissue with the confines of the right external iliac vein, obturator nerve and pelvic side wall were carefully mobilized.  Lymphostasis was achieved with cold clips.  This was set aside, labeled right obturator lymph nodes for permanent section.  The obturator nerve was inspected following these maneuvers and found to be grossly intact.  Similarly, a mirror-image lymphadenectomy was performed on the left side at the left external iliac and at the left obturator lymph nodes as well.  The single hyperfluorescent sentinel left external iliac  lymph node was set aside separately and labeled as such with a nonfluorescent tissue labeled appropriately.  The left obturator nerve was inspected following these maneuvers and found to be also intact. Next, the bladder neck was identified moving the Foley catheter back and forth.  Bladder neck dissection was carefully performed in the anterior to posterior direction separating the bladder neck from the base of the prostate, keeping what appeared to be circular bladder neck fibers with each side of the specimen respectively.   There was no median lobe. Posterior plane was entered by incising approximately 7 mm inferior- posterior to the posterior bladder neck and dissecting inferiorly and posteriorly.  The plane of Denonvilliers was encountered as were the bilateral vas deferens, which were dissected for distance of 4 cm, ligated, and placed on gentle superior traction.  The seminal vesicles were also dissected towards their tips and placed on gentle superior traction.  These were not notably enlarged.  The plane of Denonvilliers was further developed towards the apex of the prostate.  This exposed the vascular pedicles besides.  Notably on the right side, there was significant thickness and reaction around the neurovascular pedicle.  On the left side, this was controlled using sequential clipping technique in a base-to-apex orientation and partial nerve sparing was performed on the left.  On the right side, the tissue appeared to be much thicker, somewhat concerning for possible extracapsular extension, as such for a wide resection was performed without nerve sparing whatsoever on the right side with sequential clipping technique.  Next, anterior dissection was performed by carefully transected the membranous urethra keeping what appeared to be inadequate urethral stump.  This completely freed up the prostatectomy specimen, which was placed into an EndoCatch bag for later retrieval.  The anterior and distal ureteral margin was sent for frozen section and found to be negative for carcinoma. Posterior reconstruction was performed and using a single 3-0 V-Loc suture, reapproximating the posterior bladder neck to the posterior urethral plate bringing the structures into tension-free apposition. Next, mucosa-to-mucosa anastomosis was performed using a double-armed 3- 0 V-Loc suture from the 6 o'clock to the 12 o'clock position and anchoring this to the puboprostatic ligament thus performing anterior reconstruction  as well.  The new Foley catheter was easily placed, which irrigated quantitatively without leak.  Closed suction drain was then brought through the previous left lateral most robotic port site near the peritoneal cavity.  Robot was then undocked.  The previous right far lateral 12-mm assistant port was closed at the level of the fascia using Vicryl and a Carter-Thomason suture passer under laparoscopic vision. Specimen was retrieved by extending the previous camera port site for total distance of approximately 3 cm removing the prostatectomy specimen in its entirety, setting aside for permanent pathology.  The retrieval site was closed at the level of the fascia using figure-of-eight PDS x3 followed by Scarpa's reapproximation with running Vicryl.  All incision sites were infiltrated with dilute lyophilized Marcaine and closed the level of the skin using subcuticular Monocryl followed by Dermabond. Drain stitch was applied.  Procedure was then terminated.  The patient tolerated the procedure well.  There were no immediate periprocedural complications.  The patient was taken to the postanesthesia care unit in stable condition.          ______________________________ Alexis Frock, MD     TM/MEDQ  D:  10/20/2013  T:  10/21/2013  Job:  [id number]

## 2013-10-21 NOTE — Discharge Summary (Signed)
Date of admission: 10/20/2013  Date of discharge: 10/21/2013  Admission diagnosis: prostate cancer  Discharge diagnosis: same, s/p RALP  Secondary diagnoses:  Patient Active Problem List   Diagnosis Date Noted  . Malignant neoplasm of prostate 10/20/2013    History and Physical: For full details, please see admission history and physical. Briefly, Danny Odom is a 63 y.o. year old patient with prostate cancer.   Hospital Course: Patient tolerated the procedure well.  He was then transferred to the floor after an uneventful PACU stay.  His hospital course was uncomplicated.  On POD#1  he had met discharge criteria: was eating a regular diet, was up and ambulating independently,  pain was well controlled,JP drain was removed, and was ready to for discharge.   Laboratory values:   Recent Labs  10/20/13 1200 10/21/13 0524  HGB 13.6 12.7*  HCT 41.6 38.7*    Recent Labs  10/21/13 0524  NA 142  K 4.4  CL 105  CO2 28  GLUCOSE 108*  BUN 17  CREATININE 1.24  CALCIUM 8.5   No results found for this basename: LABPT, INR,  in the last 72 hours No results found for this basename: LABURIN,  in the last 72 hours No results found for this or any previous visit.  Disposition: Home  Discharge instruction: The patient was instructed to be ambulatory but told to refrain from heavy lifting, strenuous activity, or driving.   Discharge medications:    Medication List    STOP taking these medications       aspirin EC 81 MG tablet     multivitamin with minerals Tabs tablet     zinc gluconate 50 MG tablet      TAKE these medications       amlodipine-benazepril 2.5-10 MG per capsule  Commonly known as:  LOTREL  Take 1 capsule by mouth every morning.     ciprofloxacin 500 MG tablet  Commonly known as:  CIPRO  Take 1 tablet (500 mg total) by mouth 2 (two) times daily. Start day prior to office visit for foley removal     guaiFENesin 600 MG 12 hr tablet  Commonly known as:   MUCINEX  Take 600 mg by mouth every morning.     HYDROcodone-acetaminophen 5-325 MG per tablet  Commonly known as:  NORCO  Take 1-2 tablets by mouth every 6 (six) hours as needed.        Followup:      Follow-up Information   Follow up with Alexis Frock, MD On 10/30/2013. (1:30)    Specialty:  Urology   Contact information:   [place] [address] [phone]

## 2013-10-21 NOTE — Progress Notes (Signed)
1 Day Post-Op Subjective: The patient is doing well.  No nausea or vomiting. Pain is adequately controlled.  Objective: Vital signs in last 24 hours: Temp:  [98 F (36.7 C)-98.5 F (36.9 C)] 98.4 F (36.9 C) (09/12 0428) Pulse Rate:  [58-81] 58 (09/12 0428) Resp:  [15-18] 16 (09/12 0428) BP: (107-164)/(57-85) 115/71 mmHg (09/12 0428) SpO2:  [94 %-99 %] 96 % (09/12 0428)  Intake/Output from previous day: 09/11 0701 - 09/12 0700 In: 4575 [P.O.:300; I.V.:3325; IV Piggyback:950] Out: 2800 [Urine:4150; Drains:175; Blood:300] Intake/Output this shift:    Physical Exam:  General: Alert and oriented. GI: Soft, Nondistended. Incisions: Dressings intact. Urine: Clear Extremities: Nontender, no erythema, no edema.  Lab Results:  Recent Labs  10/20/13 1200 10/21/13 0524  HGB 13.6 12.7*  HCT 41.6 38.7*    Recent Labs  10/21/13 0524  NA 142  K 4.4  CL 105  CO2 28  GLUCOSE 108*  BUN 17  CREATININE 1.24  CALCIUM 8.5      Assessment/Plan: POD# 1 s/p robotic prostatectomy.  1) SL IVF 2) Ambulate, Incentive spirometry 3) Transition to oral pain medication 4) D/C pelvic drain 5) Plan for likely discharge later today   LOS: 1 day   Louis Meckel W 10/21/2013, 8:03 AM    1 Day Post-Op Subjective: The patient is doing well.  No nausea or vomiting. Pain is adequately controlled.  Objective: Vital signs in last 24 hours: Temp:  [98 F (36.7 C)-98.5 F (36.9 C)] 98.4 F (36.9 C) (09/12 0428) Pulse Rate:  [58-81] 58 (09/12 0428) Resp:  [15-18] 16 (09/12 0428) BP: (107-164)/(57-85) 115/71 mmHg (09/12 0428) SpO2:  [94 %-99 %] 96 % (09/12 0428)  Intake/Output from previous day: 09/11 0701 - 09/12 0700 In: 4575 [P.O.:300; I.V.:3325; IV Piggyback:950] Out: 3491 [Urine:4150; Drains:175; Blood:300] Intake/Output this shift:    Physical Exam:  General: Alert and oriented. GI: Soft, Nondistended. Incisions: Dressings intact. Urine: Clear Extremities:  Nontender, no erythema, no edema.  Lab Results:  Recent Labs  10/20/13 1200 10/21/13 0524  HGB 13.6 12.7*  HCT 41.6 38.7*    Recent Labs  10/21/13 0524  NA 142  K 4.4  CL 105  CO2 28  GLUCOSE 108*  BUN 17  CREATININE 1.24  CALCIUM 8.5      Assessment/Plan: POD# 1 s/p robotic prostatectomy.  1) SL IVF 2) Ambulate, Incentive spirometry 3) Transition to oral pain medication 4) Dulcolax suppository 5) D/C pelvic drain 6) Plan for likely discharge later today   LOS: 1 day   Louis Meckel W 10/21/2013, 8:03 AM

## 2013-10-21 NOTE — Progress Notes (Signed)
Discharge to home, with foley catheter in, draining with yellow clear urine. Daughter at bedside, d/c instructions , foley care done discussed with patient and daughter, stated that they have attended the class prior to surgery and know how to do foley care. PIV removed no s/s of infiltration or swelling noted.

## 2013-10-23 ENCOUNTER — Encounter (HOSPITAL_COMMUNITY): Payer: Self-pay | Admitting: Urology

## 2014-02-08 ENCOUNTER — Institutional Professional Consult (permissible substitution): Payer: Managed Care, Other (non HMO) | Admitting: Radiation Oncology

## 2014-02-14 ENCOUNTER — Encounter: Payer: Self-pay | Admitting: Radiation Oncology

## 2014-02-14 NOTE — Progress Notes (Signed)
GU Location of Tumor / Histology: Adenocarcinoma of the Prostate  If Prostate Cancer, Gleason Score is (4 + 3) and PSA is (7.81)  Danny Odom presented  months ago with signs/symptoms of rising PSA with change in 1 year from 0.63 to 0.7  Biopsies of Prostate and Lymph Nodes (if applicable) revealed:  3/79/02 Diagnosis 1. Soft tissue, biopsy, distal anterior ureteral margin - BENIGN UROTHELIUM AND UNDERLYING MUSCULARIS TISSUE. - NO EVIDENCE OF TUMOR. 2. Fatty tissue, peri prostatic - MATURE ADIPOSE TISSUE. - NO EVIDENCE OF TUMOR. 3. Lymph nodes, regional resection, right external iliac - SIX LYMPH NODES, NEGATIVE FOR METASTATIC CARCINOMA (0/6) 4. Lymph nodes, regional resection, right obturator - EIGHT LYMPH NODES, NEGATIVE FOR METASTATIC CARCINOMA (0/8). 5. Lymph node, biopsy, left external iliac sentinel - ONE LYMPH NODE, POSITIVE FOR METASTATIC CARCINOMA (1/1) 6. Lymph node, biopsy, left external iliac - THREE LYMPH NODES, NEGATIVE FOR METASTATIC CARCINOMA (0/3) 7. Lymph nodes, regional resection, left obturator - FIVE LYMPH NODES, NEGATIVE FOR METASTATIC CARCINOMA (0/5) 8. Prostate, radical resection - PROSTATIC ADENOCARCINOMA, GLEASON'S SCORE 4+3= 7 WITH TERTIARY PATTERN FIVE, INVOLVING BOTH LOBE, WITH EXTRAPROSTATIC EXTENSION PRESENT. - RESECTION MARGINS, NEGATIVE FOR ATYPIA OR MALIGNANCY. PLEASE SEE ONCOLOGY  Past/Anticipated interventions by urology, if any: Radical Robotic Prostatectomy, with Lymphdencetomy Left and Right  Past/Anticipated interventions by medical oncology, if any:   Weight changes, if any: Gained weight from 160 to 173 lbs since his surgery 9 on his scale  Bowel/Bladder complaints, if any: Stress incontinence since prostatectomy: As noted by  Dr. Alexis Frock, "near 100% continence by 3 months".   Nocturia x 1  Nausea/Vomiting, if any:No  Pain issues, if any: No   SAFETY ISSUES:  Prior radiation? No  Pacemaker/ICD? No  Possible current  pregnancy? No  Is the patient on methotrexate? No  Current Complaints / other details:   Stress Incontinence since prostatectomy  Mother Breast Cancer/Father - Colon cancer   ED since mid 59's requiring PDEsi  No Androgen Deprivation

## 2014-02-15 ENCOUNTER — Encounter: Payer: Self-pay | Admitting: Radiation Oncology

## 2014-02-15 ENCOUNTER — Ambulatory Visit
Admission: RE | Admit: 2014-02-15 | Discharge: 2014-02-15 | Disposition: A | Discharge status: Home / Self Care | Payer: Managed Care, Other (non HMO) | Source: Ambulatory Visit | Attending: Radiation Oncology | Admitting: Radiation Oncology

## 2014-02-15 VITALS — BP 132/66 | HR 57 | Temp 98.5°F | Ht 71.0 in | Wt 180.1 lb

## 2014-02-15 DIAGNOSIS — C61 Malignant neoplasm of prostate: Secondary | ICD-10-CM | POA: Diagnosis present

## 2014-02-15 HISTORY — DX: Male erectile dysfunction, unspecified: N52.9

## 2014-02-15 HISTORY — DX: Malignant neoplasm of prostate: C61

## 2014-02-15 HISTORY — DX: Stress incontinence (female) (male): N39.3

## 2014-02-15 HISTORY — DX: Cardiac murmur, unspecified: R01.1

## 2014-02-15 HISTORY — DX: Gastro-esophageal reflux disease without esophagitis: K21.9

## 2014-02-15 NOTE — Addendum Note (Signed)
Encounter addended by: Deirdre Evener, RN on: 02/15/2014  6:16 PM<BR>     Documentation filed: Charges VN

## 2014-02-15 NOTE — Progress Notes (Signed)
[facility] NEW PATIENT EVALUATION  Name: Danny Odom MRN: [id number]  Date:   02/15/2014           DOB: [birth year]  Status: outpatient   CC: Rusty Aus., MD  Alexis Frock, MD    REFERRING PHYSICIAN: Alexis Frock, MD   DIAGNOSIS: Pathologic stage T3a N1 M0  adenocarcinoma prostate  HISTORY OF PRESENT ILLNESS:  Danny Odom is a 64 y.o. male who is Seen today through the courtesy of Dr. Tresa Moore for discussion of possible postoperative radiation therapy in the management of his pathologic stage T3a N1 M0 adenocarcinoma prostate.  He presented with an elevated PSA of 7.81 which had risen from less than 3 2014.  Ultrasound-guided biopsies on 08/01/2013 showed Gleason 7 (3+4) in 2 biopsies and Gleason 6 (3+3) in 1 biopsy.  He elected for a nerve sparing robotic prostatectomy.  He underwent surgery on 10/20/2013.  He was found have Gleason 7 (4+3) tertiary pattern of 5 involving both lobes with extraprostatic extension.  All margins were negative.  20% of the prostate was involved.  There was involvement of the apex.  One of 23 lymph nodes contained metastatic disease (left external iliac/sentinel node).  He has recovered nicely, and he reached full urinary continence by 3 months postop.  He does have erectile dysfunction.  His first postoperative PSA on November 15 was 0.63, rising to 0.7 by 01/23/2014.  He is rather active and wants to do anything reasonable to cure his disease.  PREVIOUS RADIATION THERAPY: No   PAST MEDICAL HISTORY:  has a past medical history of Hypertension; Aortic stenosis; Arthritis; Prostate cancer (10/20/13); ED (erectile dysfunction); Stress incontinence; Esophageal reflux; and Cardiac murmur.     PAST SURGICAL HISTORY:  Past Surgical History  Procedure Laterality Date  . Colonoscopy    . Varicocele surgery  Right   . Tonsillectomy    . Robot assisted laparoscopic radical prostatectomy N/A 10/20/2013    Procedure: ROBOTIC  ASSISTED LAPAROSCOPIC RADICAL PROSTATECTOMY WITH INDOCYANINE GREEN DYE;  Surgeon: Alexis Frock, MD;  Location: [facility];  Service: Urology;  Laterality: N/A;  . Lymphadenectomy Bilateral 10/20/2013    Procedure: BILATERAL PELVIC LYMPH NODE DISSECTION;  Surgeon: Alexis Frock, MD;  Location: [facility];  Service: Urology;  Laterality: Bilateral;     FAMILY HISTORY: family history includes Breast cancer in his mother; Emphysema in his father; Prostate cancer in his father.  His father died from complications of COPD at 16.  He also had a history of colon cancer.  His mother died of complications of COPD at 15.  No family history of prostate cancer.   SOCIAL HISTORY:  reports that he quit smoking about 31 years ago. His smoking use included Cigarettes. He has a 20 pack-year smoking history. He has never used smokeless tobacco. He reports that he drinks alcohol. He reports that he does not use illicit drugs.  Divorced 3 times, 2 children.  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   ALLERGIES: Review of patient's allergies indicates no known allergies.   MEDICATIONS:  Current Outpatient Prescriptions  Medication Sig Dispense Refill  . amlodipine-benazepril (LOTREL) 2.5-10 MG per capsule Take 1 capsule by mouth every morning.    [name] aspirin 81 MG tablet Take 81 mg by mouth daily.    [name] guaiFENesin (MUCINEX) 600 MG 12 hr tablet Take 600 mg by mouth every morning.    [name] HYDROcodone-acetaminophen (NORCO) 5-325 MG per tablet Take 1-2 tablets by mouth every  6 (six) hours as needed. 30 tablet 0  . Multiple Vitamin (MULTIVITAMIN) capsule Take 1 capsule by mouth daily.    . Multiple Vitamins-Minerals (CENTRUM SILVER ADULT 50+ PO) Take 1 tablet by mouth.    . ciprofloxacin (CIPRO) 500 MG tablet Take 1 tablet (500 mg total) by mouth 2 (two) times daily. Start day prior to office visit for foley removal (Patient not taking: Reported on 02/15/2014) 6 tablet 0   No current facility-administered medications for  this encounter.     REVIEW OF SYSTEMS:  Pertinent items are noted in HPI.    PHYSICAL EXAM:  height is 5' 11"  (1.803 m) and weight is 180 lb 1.6 oz (81.693 kg). His temperature is 98.5 F (36.9 C). His blood pressure is 132/66 and his pulse is 57.   Head and neck examination: Grossly unremarkable.  Nodes: Without palpable cervical or supraclavicular lymphadenopathy.  Abdomen: Without masses organomegaly.  Genitalia: Unremarkable to inspection.  Rectal: Prostate bed is flat and is without masses or nodularity.  Extremities: Without edema.   LABORATORY DATA:  Lab Results  Component Value Date   WBC 7.2 10/12/2013   HGB 12.7* 10/21/2013   HCT 38.7* 10/21/2013   MCV 94.9 10/12/2013   PLT 214 10/12/2013   Lab Results  Component Value Date   NA 142 10/21/2013   K 4.4 10/21/2013   CL 105 10/21/2013   CO2 28 10/21/2013   No results found for: ALT, AST, GGT, ALKPHOS, BILITOT  PSA 0.7 from 01/23/2014   IMPRESSION: Pathologic stage T3a  N1 M0 adenocarcinoma prostate.  I explained to the patient that he may have local/regional disease alone, distant disease alone, or both local/regional and distant disease.  A retrospective article from the Journal of Clinical Oncology reviewed over 1000 patients from [place] and [place] who had node-positive prostate cancer, treated surgically.  "Adjuvant radiation therapy" was felt to benefit patients with 2 or fewer positive lymph nodes along with a Gleason score of 7-10.  The vast majority of these patients also received androgen deprivation therapy.  I do not feel that any deprivation therapy is necessary, and would certainly affect his quality of life.  He tells met the quality of his life is most important to him.  Other nonpositive lymph node studies suggest that patients with a positive margin and disease-free interval benefit the most from postoperative radiation therapy.  I believe he is a reasonable candidate for postoperative radiation therapy.  We  discussed the potential acute and late toxicities of treating his prostate bed and pelvic lymph nodes.  Treatment should be reasonably well tolerated.  Consent is signed today.  We will have him return for simulation/treatment planning in the near future.  PLAN: As above.  I spent 60  minutes face to face with the patient and more than 50% of that time was spent in counseling and/or coordination of care.

## 2014-02-21 ENCOUNTER — Ambulatory Visit
Admission: RE | Admit: 2014-02-21 | Discharge: 2014-02-21 | Disposition: A | Discharge status: Home / Self Care | Payer: Managed Care, Other (non HMO) | Source: Ambulatory Visit | Attending: Radiation Oncology | Admitting: Radiation Oncology

## 2014-02-21 DIAGNOSIS — C61 Malignant neoplasm of prostate: Secondary | ICD-10-CM | POA: Diagnosis not present

## 2014-02-21 NOTE — Progress Notes (Signed)
Complex simulation/treatment planning note: The patient was taken to the CT simulator.  A Vac lock immobilization device was constructed.  A red rubber tube was placed within the rectal vault.  He was then catheterized and contrast instilled into the bladder/urethra.  He was then scanned.  There was an excess amount of gas and stool within the rectum, and he was asked to evacuate his rectal contents.  He was then rescanned with some loss of bladder volume.  An isocenter was chosen.  The CT data set was sent to the  MIM planning system where contoured his CTV 66 (high risk prostate bed) and expanded this by 0.5 cm to create PTV 66 which will receive 6600 cGy in 33 sessions..  I also contoured PTV 54.45, his  PTV nodal bed which will receive 5445 cGy in 33 sessions.  I contoured the bladder and rectum as well.  Dosimetry will contoured his bowel and femoral heads.  He is now ready for IMRT simulation/treatment planning.

## 2014-02-26 ENCOUNTER — Encounter: Payer: Self-pay | Admitting: Radiation Oncology

## 2014-02-26 DIAGNOSIS — C61 Malignant neoplasm of prostate: Secondary | ICD-10-CM | POA: Diagnosis not present

## 2014-02-26 NOTE — Progress Notes (Addendum)
IMRT simulation/treatment planning note: The patient completed his IMRT simulation/treatment planning in the management of his carcinoma the prostate.  IMRT was chosen to decrease the risk for both acute and late rectal and bladder toxicity compared to conventional or 3-D conformal radiation therapy.  Dose volume histograms were obtained for our target structures including his prostate bed and pelvic lymph nodes.  Dose volume histograms were also obtained for avoidance structures including the bladder, rectum, and femoral heads.  We met our departmental guidelines.  I am prescribing 6600 cGy in 33 sessions to his high risk prostate bed and 5445 cGy in 33 sessions to his the pelvic lymph nodes.  He is being treated with VMAT IMRT with 3 arcs utilizing 6 MV photons.

## 2014-02-28 DIAGNOSIS — C61 Malignant neoplasm of prostate: Secondary | ICD-10-CM | POA: Diagnosis not present

## 2014-03-05 ENCOUNTER — Ambulatory Visit: Payer: Managed Care, Other (non HMO) | Admitting: Radiation Oncology

## 2014-03-05 ENCOUNTER — Ambulatory Visit
Admission: RE | Admit: 2014-03-05 | Discharge: 2014-03-05 | Disposition: A | Discharge status: Home / Self Care | Payer: Managed Care, Other (non HMO) | Source: Ambulatory Visit | Attending: Radiation Oncology | Admitting: Radiation Oncology

## 2014-03-05 ENCOUNTER — Encounter: Payer: Self-pay | Admitting: Radiation Oncology

## 2014-03-05 VITALS — BP 137/84 | HR 78 | Temp 98.2°F | Resp 12 | Wt 180.4 lb

## 2014-03-05 DIAGNOSIS — C61 Malignant neoplasm of prostate: Secondary | ICD-10-CM

## 2014-03-05 NOTE — Progress Notes (Signed)
Weekly Management Note:  Site: Prostate bed/pelvic lymph nodes Current Dose:  200  cGy Projected Dose: 6600  cGy  Narrative: The patient is seen today for routine under treatment assessment. CBCT/MVCT images/port films were reviewed. The chart was reviewed.   Bladder filling is excellent.  Physical Examination:  Filed Vitals:   03/05/14 1702  BP: 137/84  Pulse: 78  Temp: 98.2 F (36.8 C)  Resp: 12  .  Weight: 180 lb 6.4 oz (81.829 kg).  No change.  Impression: Tolerating radiation therapy well.  Plan: Continue radiation therapy as planned.

## 2014-03-05 NOTE — Progress Notes (Signed)
Chart note: The patient began his IMRT today in the management of his recurrent carcinoma the prostate.  He was set up with dual ARC VMAT with 2 sets dynamic MLCs corresponding to one set of IMRT treatment devices (32761).

## 2014-03-05 NOTE — Progress Notes (Signed)
He is currently in no pain.  Pt denies abnormal urinary symptoms. Pt states they urinate 0 - 1 times per night.  Pt reports occasionally firm bowel movements, reports he eats figs, encouraged bran, flax seed and water intake.   BP 137/84 mmHg  Pulse 78  Temp(Src) 98.2 F (36.8 C) (Oral)  Resp 12  Wt 180 lb 6.4 oz (81.829 kg)  SpO2 97%

## 2014-03-06 ENCOUNTER — Ambulatory Visit
Admission: RE | Admit: 2014-03-06 | Discharge: 2014-03-06 | Disposition: A | Discharge status: Home / Self Care | Payer: Managed Care, Other (non HMO) | Source: Ambulatory Visit | Attending: Radiation Oncology | Admitting: Radiation Oncology

## 2014-03-06 DIAGNOSIS — C61 Malignant neoplasm of prostate: Secondary | ICD-10-CM | POA: Diagnosis not present

## 2014-03-07 ENCOUNTER — Ambulatory Visit
Admission: RE | Admit: 2014-03-07 | Discharge: 2014-03-07 | Disposition: A | Discharge status: Home / Self Care | Payer: Managed Care, Other (non HMO) | Source: Ambulatory Visit | Attending: Radiation Oncology | Admitting: Radiation Oncology

## 2014-03-07 DIAGNOSIS — C61 Malignant neoplasm of prostate: Secondary | ICD-10-CM | POA: Diagnosis not present

## 2014-03-08 ENCOUNTER — Ambulatory Visit
Admission: RE | Admit: 2014-03-08 | Discharge: 2014-03-08 | Disposition: A | Discharge status: Home / Self Care | Payer: Managed Care, Other (non HMO) | Source: Ambulatory Visit | Attending: Radiation Oncology | Admitting: Radiation Oncology

## 2014-03-08 DIAGNOSIS — C61 Malignant neoplasm of prostate: Secondary | ICD-10-CM | POA: Diagnosis not present

## 2014-03-09 ENCOUNTER — Ambulatory Visit
Admission: RE | Admit: 2014-03-09 | Discharge: 2014-03-09 | Disposition: A | Discharge status: Home / Self Care | Payer: Managed Care, Other (non HMO) | Source: Ambulatory Visit | Attending: Radiation Oncology | Admitting: Radiation Oncology

## 2014-03-09 DIAGNOSIS — C61 Malignant neoplasm of prostate: Secondary | ICD-10-CM | POA: Diagnosis not present

## 2014-03-12 ENCOUNTER — Ambulatory Visit
Admission: RE | Admit: 2014-03-12 | Discharge: 2014-03-12 | Disposition: A | Discharge status: Home / Self Care | Payer: Managed Care, Other (non HMO) | Source: Ambulatory Visit | Attending: Radiation Oncology | Admitting: Radiation Oncology

## 2014-03-12 ENCOUNTER — Encounter: Payer: Self-pay | Admitting: Radiation Oncology

## 2014-03-12 VITALS — BP 123/79 | HR 63 | Temp 98.6°F | Ht 71.0 in | Wt 179.9 lb

## 2014-03-12 DIAGNOSIS — C61 Malignant neoplasm of prostate: Secondary | ICD-10-CM | POA: Diagnosis not present

## 2014-03-12 NOTE — Progress Notes (Signed)
Weekly Management Note:  Site: Prostate bed/regional lymph nodes Current Dose:  1200  cGy Projected Dose: 6600  cGy  Narrative: The patient is seen today for routine under treatment assessment. CBCT/MVCT images/port films were reviewed. The chart was reviewed.   Bladder filling is satisfactory.  No GU or GI difficulties.  Physical Examination:  Filed Vitals:   03/12/14 1658  BP: 123/79  Pulse: 63  Temp: 98.6 F (37 C)  .  Weight: 179 lb 14.4 oz (81.602 kg).  No change.  Impression: Tolerating radiation therapy well.  Plan: Continue radiation therapy as planned.

## 2014-03-12 NOTE — Progress Notes (Signed)
Danny Odom has received 6 fractions to his pelvis.  He denies any changes in his urination pattern, nor pain.

## 2014-03-13 ENCOUNTER — Ambulatory Visit
Admission: RE | Admit: 2014-03-13 | Discharge: 2014-03-13 | Disposition: A | Discharge status: Home / Self Care | Payer: Managed Care, Other (non HMO) | Source: Ambulatory Visit | Attending: Radiation Oncology | Admitting: Radiation Oncology

## 2014-03-13 DIAGNOSIS — C61 Malignant neoplasm of prostate: Secondary | ICD-10-CM | POA: Diagnosis not present

## 2014-03-14 ENCOUNTER — Ambulatory Visit
Admission: RE | Admit: 2014-03-14 | Discharge: 2014-03-14 | Disposition: A | Discharge status: Home / Self Care | Payer: Managed Care, Other (non HMO) | Source: Ambulatory Visit | Attending: Radiation Oncology | Admitting: Radiation Oncology

## 2014-03-14 DIAGNOSIS — C61 Malignant neoplasm of prostate: Secondary | ICD-10-CM | POA: Diagnosis not present

## 2014-03-15 ENCOUNTER — Ambulatory Visit
Admission: RE | Admit: 2014-03-15 | Discharge: 2014-03-15 | Disposition: A | Discharge status: Home / Self Care | Payer: Managed Care, Other (non HMO) | Source: Ambulatory Visit | Attending: Radiation Oncology | Admitting: Radiation Oncology

## 2014-03-15 VITALS — BP 127/61 | HR 84 | Temp 100.2°F | Wt 180.2 lb

## 2014-03-15 DIAGNOSIS — C61 Malignant neoplasm of prostate: Secondary | ICD-10-CM

## 2014-03-15 NOTE — Progress Notes (Signed)
Clinic note: Danny Odom is seen today after his treatment fairly lethargic and having myalgias.  His temperature today is 100.2.  He tells me that he had what he thought was a sinus infection last week.  He felt worse yesterday.  I suspect that he has some type of viral illness, but if his temperature goes above 101 or if he is not improved, he is to see his primary care physician.  Of note is that he did have his flu shot this year.

## 2014-03-15 NOTE — Progress Notes (Signed)
Patient for assessment of just not feeling well.Completed 9 treatments to prostate.No symptoms related to treatment field.Vitals fine except low grade temp of 100.2, body aches and feeling of lethargy.bladder/bowel fine.

## 2014-03-16 ENCOUNTER — Ambulatory Visit
Admission: RE | Admit: 2014-03-16 | Discharge: 2014-03-16 | Disposition: A | Discharge status: Home / Self Care | Payer: Managed Care, Other (non HMO) | Source: Ambulatory Visit | Attending: Radiation Oncology | Admitting: Radiation Oncology

## 2014-03-16 DIAGNOSIS — C61 Malignant neoplasm of prostate: Secondary | ICD-10-CM | POA: Diagnosis not present

## 2014-03-19 ENCOUNTER — Encounter: Payer: Self-pay | Admitting: Radiation Oncology

## 2014-03-19 ENCOUNTER — Ambulatory Visit
Admission: RE | Admit: 2014-03-19 | Discharge: 2014-03-19 | Disposition: A | Discharge status: Home / Self Care | Payer: Managed Care, Other (non HMO) | Source: Ambulatory Visit | Attending: Radiation Oncology | Admitting: Radiation Oncology

## 2014-03-19 VITALS — BP 131/72 | HR 60 | Temp 98.4°F | Resp 12 | Wt 178.3 lb

## 2014-03-19 DIAGNOSIS — C61 Malignant neoplasm of prostate: Secondary | ICD-10-CM | POA: Diagnosis not present

## 2014-03-19 NOTE — Progress Notes (Signed)
He is currently in no pain.   Pt complains of fatigue and weakness Pt reports urinary frequency. Pt states they urinate 0 - 1 times per night.  Pt reports Diarrhea on occasion.  Reports he had the "flu" which caused him to have a limit appetite and diarrhea. Has been taking Imodium which has alleviated the diarrhea.  He is feeling much better. BP 131/72 mmHg  Pulse 60  Temp(Src) 98.4 F (36.9 C) (Oral)  Resp 12  Wt 178 lb 4.8 oz (80.876 kg)  SpO2 100%

## 2014-03-19 NOTE — Progress Notes (Signed)
Weekly Management Note:  Site: prostate bed/pelvis Current Dose:   2200  cGy Projected Dose:  6600  cGy  Narrative: The patient is seen today for routine under treatment assessment. CBCT/MVCT images/port films were reviewed. The chart was reviewed.    Bladder filling today is excellent. No new GU or GI difficulties. He has recovered from the "flu".  Physical Examination:  Filed Vitals:   03/19/14 1652  BP: 131/72  Pulse: 60  Temp: 98.4 F (36.9 C)  Resp: 12  .  Weight: 178 lb 4.8 oz (80.876 kg).   No change.  Impression: Tolerating radiation therapy well.  Plan: Continue radiation therapy as planned.

## 2014-03-20 ENCOUNTER — Ambulatory Visit
Admission: RE | Admit: 2014-03-20 | Discharge: 2014-03-20 | Disposition: A | Discharge status: Home / Self Care | Payer: Managed Care, Other (non HMO) | Source: Ambulatory Visit | Attending: Radiation Oncology | Admitting: Radiation Oncology

## 2014-03-20 DIAGNOSIS — C61 Malignant neoplasm of prostate: Secondary | ICD-10-CM | POA: Diagnosis not present

## 2014-03-21 ENCOUNTER — Ambulatory Visit
Admission: RE | Admit: 2014-03-21 | Discharge: 2014-03-21 | Disposition: A | Discharge status: Home / Self Care | Payer: Managed Care, Other (non HMO) | Source: Ambulatory Visit | Attending: Radiation Oncology | Admitting: Radiation Oncology

## 2014-03-21 DIAGNOSIS — C61 Malignant neoplasm of prostate: Secondary | ICD-10-CM | POA: Diagnosis not present

## 2014-03-22 ENCOUNTER — Ambulatory Visit
Admission: RE | Admit: 2014-03-22 | Discharge: 2014-03-22 | Disposition: A | Discharge status: Home / Self Care | Payer: Managed Care, Other (non HMO) | Source: Ambulatory Visit | Attending: Radiation Oncology | Admitting: Radiation Oncology

## 2014-03-22 DIAGNOSIS — C61 Malignant neoplasm of prostate: Secondary | ICD-10-CM | POA: Diagnosis not present

## 2014-03-23 ENCOUNTER — Ambulatory Visit
Admission: RE | Admit: 2014-03-23 | Discharge: 2014-03-23 | Disposition: A | Discharge status: Home / Self Care | Payer: Managed Care, Other (non HMO) | Source: Ambulatory Visit | Attending: Radiation Oncology | Admitting: Radiation Oncology

## 2014-03-23 DIAGNOSIS — C61 Malignant neoplasm of prostate: Secondary | ICD-10-CM | POA: Diagnosis not present

## 2014-03-26 ENCOUNTER — Encounter: Payer: Self-pay | Admitting: Radiation Oncology

## 2014-03-26 ENCOUNTER — Ambulatory Visit
Admission: RE | Admit: 2014-03-26 | Discharge: 2014-03-26 | Disposition: A | Discharge status: Home / Self Care | Payer: Managed Care, Other (non HMO) | Source: Ambulatory Visit | Attending: Radiation Oncology | Admitting: Radiation Oncology

## 2014-03-26 VITALS — BP 128/72 | HR 65 | Temp 98.1°F | Resp 12 | Wt 182.0 lb

## 2014-03-26 DIAGNOSIS — C61 Malignant neoplasm of prostate: Secondary | ICD-10-CM | POA: Diagnosis not present

## 2014-03-26 NOTE — Progress Notes (Signed)
He is currently in no pain.   Pt complains of fatigue Pt reports urinary frequency. Pt states they urinate 0 - 1 times per night.  Pt reports the diarrhea he was experience has dissipated.  He is taking Imodium PRN.  He reports he has had loose and soft bowel movements.   BP 128/72 mmHg  Pulse 65  Temp(Src) 98.1 F (36.7 C) (Oral)  Resp 12  Wt 182 lb (82.555 kg)  SpO2 98%

## 2014-03-26 NOTE — Progress Notes (Signed)
Weekly Management Note:  Site: Prostate bed/pelvic lymph nodes  Current Dose:  3200  cGy Projected Dose: 6600  cGy  Narrative: The patient is seen today for routine under treatment assessment. CBCT/MVCT images/port films were reviewed. The chart was reviewed.   Bladder filling is excellent.  No GU or GI difficulties.  Physical Examination:  Filed Vitals:   03/26/14 1627  BP: 128/72  Pulse: 65  Temp: 98.1 F (36.7 C)  Resp: 12  .  Weight: 182 lb (82.555 kg).  No change.  Impression: Tolerating radiation therapy well.  Plan: Continue radiation therapy as planned.

## 2014-03-27 ENCOUNTER — Ambulatory Visit
Admission: RE | Admit: 2014-03-27 | Discharge: 2014-03-27 | Disposition: A | Discharge status: Home / Self Care | Payer: Managed Care, Other (non HMO) | Source: Ambulatory Visit | Attending: Radiation Oncology | Admitting: Radiation Oncology

## 2014-03-27 DIAGNOSIS — C61 Malignant neoplasm of prostate: Secondary | ICD-10-CM | POA: Diagnosis not present

## 2014-03-28 ENCOUNTER — Ambulatory Visit
Admission: RE | Admit: 2014-03-28 | Discharge: 2014-03-28 | Disposition: A | Discharge status: Home / Self Care | Payer: Managed Care, Other (non HMO) | Source: Ambulatory Visit | Attending: Radiation Oncology | Admitting: Radiation Oncology

## 2014-03-28 DIAGNOSIS — C61 Malignant neoplasm of prostate: Secondary | ICD-10-CM | POA: Diagnosis not present

## 2014-03-29 ENCOUNTER — Ambulatory Visit
Admission: RE | Admit: 2014-03-29 | Discharge: 2014-03-29 | Disposition: A | Discharge status: Home / Self Care | Payer: Managed Care, Other (non HMO) | Source: Ambulatory Visit | Attending: Radiation Oncology | Admitting: Radiation Oncology

## 2014-03-29 DIAGNOSIS — C61 Malignant neoplasm of prostate: Secondary | ICD-10-CM | POA: Diagnosis not present

## 2014-03-30 ENCOUNTER — Ambulatory Visit
Admission: RE | Admit: 2014-03-30 | Discharge: 2014-03-30 | Disposition: A | Discharge status: Home / Self Care | Payer: Managed Care, Other (non HMO) | Source: Ambulatory Visit | Attending: Radiation Oncology | Admitting: Radiation Oncology

## 2014-03-30 DIAGNOSIS — C61 Malignant neoplasm of prostate: Secondary | ICD-10-CM | POA: Diagnosis not present

## 2014-04-02 ENCOUNTER — Ambulatory Visit
Admission: RE | Admit: 2014-04-02 | Discharge: 2014-04-02 | Disposition: A | Discharge status: Home / Self Care | Payer: Managed Care, Other (non HMO) | Source: Ambulatory Visit | Attending: Radiation Oncology | Admitting: Radiation Oncology

## 2014-04-02 DIAGNOSIS — C61 Malignant neoplasm of prostate: Secondary | ICD-10-CM

## 2014-04-02 NOTE — Progress Notes (Signed)
Weekly Management Note:  Site: Prostate bed/pelvis Current Dose:  4200  cGy Projected Dose: 6600  cGy  Narrative: The patient is seen today for routine under treatment assessment. CBCT/MVCT images/port films were reviewed. The chart was reviewed.   Bladder filling is excellent.  He is doing well from a GU and GI standpoint although he does have intermittent loose stools for which she takes Imodium.  He also has moderate fatigue.  Physical Examination: There were no vitals filed for this visit..  Weight:  .  No change.  Impression: Tolerating radiation therapy well.  Plan: Continue radiation therapy as planned.

## 2014-04-02 NOTE — Progress Notes (Signed)
[name] has received 21 fractions to his pelvis for prostate cancer.  He states the only issue he has is intermittent loose stools which is resolved with taking Imodium.  Also reports fatigue.

## 2014-04-03 ENCOUNTER — Ambulatory Visit
Admission: RE | Admit: 2014-04-03 | Discharge: 2014-04-03 | Disposition: A | Discharge status: Home / Self Care | Payer: Managed Care, Other (non HMO) | Source: Ambulatory Visit | Attending: Radiation Oncology | Admitting: Radiation Oncology

## 2014-04-03 DIAGNOSIS — C61 Malignant neoplasm of prostate: Secondary | ICD-10-CM | POA: Diagnosis not present

## 2014-04-04 ENCOUNTER — Ambulatory Visit
Admission: RE | Admit: 2014-04-04 | Discharge: 2014-04-04 | Disposition: A | Discharge status: Home / Self Care | Payer: Managed Care, Other (non HMO) | Source: Ambulatory Visit | Attending: Radiation Oncology | Admitting: Radiation Oncology

## 2014-04-04 DIAGNOSIS — C61 Malignant neoplasm of prostate: Secondary | ICD-10-CM | POA: Diagnosis not present

## 2014-04-05 ENCOUNTER — Ambulatory Visit
Admission: RE | Admit: 2014-04-05 | Discharge: 2014-04-05 | Disposition: A | Discharge status: Home / Self Care | Payer: Managed Care, Other (non HMO) | Source: Ambulatory Visit | Attending: Radiation Oncology | Admitting: Radiation Oncology

## 2014-04-05 DIAGNOSIS — C61 Malignant neoplasm of prostate: Secondary | ICD-10-CM | POA: Diagnosis not present

## 2014-04-06 ENCOUNTER — Ambulatory Visit
Admission: RE | Admit: 2014-04-06 | Discharge: 2014-04-06 | Disposition: A | Discharge status: Home / Self Care | Payer: Managed Care, Other (non HMO) | Source: Ambulatory Visit | Attending: Radiation Oncology | Admitting: Radiation Oncology

## 2014-04-06 DIAGNOSIS — C61 Malignant neoplasm of prostate: Secondary | ICD-10-CM | POA: Diagnosis not present

## 2014-04-09 ENCOUNTER — Ambulatory Visit
Admission: RE | Admit: 2014-04-09 | Discharge: 2014-04-09 | Disposition: A | Discharge status: Home / Self Care | Payer: Managed Care, Other (non HMO) | Source: Ambulatory Visit | Attending: Radiation Oncology | Admitting: Radiation Oncology

## 2014-04-09 ENCOUNTER — Encounter: Payer: Self-pay | Admitting: Radiation Oncology

## 2014-04-09 VITALS — BP 126/75 | HR 68 | Temp 98.9°F | Resp 12 | Wt 177.9 lb

## 2014-04-09 DIAGNOSIS — C61 Malignant neoplasm of prostate: Secondary | ICD-10-CM | POA: Diagnosis not present

## 2014-04-09 NOTE — Progress Notes (Signed)
Weekly Management Note:  Site: Prostate bed/pelvic lymph nodes Current Dose:  5200  cGy Projected Dose: 6600  cGy  Narrative: The patient is seen today for routine under treatment assessment. CBCT/MVCT images/port films were reviewed. The chart was reviewed.   Bladder filling is satisfactory today.  No particular GU or GI difficulty.  He does report fatigue.  Physical Examination:  Filed Vitals:   04/09/14 1702  BP: 126/75  Pulse: 68  Temp: 98.9 F (37.2 C)  Resp: 12  .  Weight: 177 lb 14.4 oz (80.695 kg).  No change.  Impression: Tolerating radiation therapy well.  Plan: Continue radiation therapy as planned.

## 2014-04-09 NOTE — Progress Notes (Signed)
He is currently in no pain.  Pt complains of fatigue  Occasional dribbling. Pt states they urinate 0 - 1 times per night.  Pt reports Diarrhea a few times. Takes imodium as needed.  Reports rectal discomfort, using dove to cleanse area.   BP 126/75 mmHg  Pulse 68  Temp(Src) 98.9 F (37.2 C) (Oral)  Resp 12  Wt 177 lb 14.4 oz (80.695 kg)  SpO2 97%

## 2014-04-10 ENCOUNTER — Ambulatory Visit
Admission: RE | Admit: 2014-04-10 | Discharge: 2014-04-10 | Disposition: A | Discharge status: Home / Self Care | Payer: Managed Care, Other (non HMO) | Source: Ambulatory Visit | Attending: Radiation Oncology | Admitting: Radiation Oncology

## 2014-04-10 DIAGNOSIS — C61 Malignant neoplasm of prostate: Secondary | ICD-10-CM | POA: Diagnosis not present

## 2014-04-11 ENCOUNTER — Ambulatory Visit
Admission: RE | Admit: 2014-04-11 | Discharge: 2014-04-11 | Disposition: A | Discharge status: Home / Self Care | Payer: Managed Care, Other (non HMO) | Source: Ambulatory Visit | Attending: Radiation Oncology | Admitting: Radiation Oncology

## 2014-04-11 DIAGNOSIS — C61 Malignant neoplasm of prostate: Secondary | ICD-10-CM | POA: Diagnosis not present

## 2014-04-12 ENCOUNTER — Ambulatory Visit
Admission: RE | Admit: 2014-04-12 | Discharge: 2014-04-12 | Disposition: A | Discharge status: Home / Self Care | Payer: Managed Care, Other (non HMO) | Source: Ambulatory Visit | Attending: Radiation Oncology | Admitting: Radiation Oncology

## 2014-04-12 DIAGNOSIS — C61 Malignant neoplasm of prostate: Secondary | ICD-10-CM | POA: Diagnosis not present

## 2014-04-13 ENCOUNTER — Ambulatory Visit
Admission: RE | Admit: 2014-04-13 | Discharge: 2014-04-13 | Disposition: A | Discharge status: Home / Self Care | Payer: Managed Care, Other (non HMO) | Source: Ambulatory Visit | Attending: Radiation Oncology | Admitting: Radiation Oncology

## 2014-04-13 DIAGNOSIS — C61 Malignant neoplasm of prostate: Secondary | ICD-10-CM | POA: Diagnosis not present

## 2014-04-16 ENCOUNTER — Ambulatory Visit
Admission: RE | Admit: 2014-04-16 | Discharge: 2014-04-16 | Disposition: A | Discharge status: Home / Self Care | Payer: Managed Care, Other (non HMO) | Source: Ambulatory Visit | Attending: Radiation Oncology | Admitting: Radiation Oncology

## 2014-04-16 ENCOUNTER — Ambulatory Visit: Payer: Managed Care, Other (non HMO)

## 2014-04-16 ENCOUNTER — Ambulatory Visit
Admission: RE | Admit: 2014-04-16 | Payer: Managed Care, Other (non HMO) | Source: Ambulatory Visit | Admitting: Radiation Oncology

## 2014-04-17 ENCOUNTER — Encounter: Payer: Self-pay | Admitting: Radiation Oncology

## 2014-04-17 ENCOUNTER — Ambulatory Visit
Admission: RE | Admit: 2014-04-17 | Discharge: 2014-04-17 | Disposition: A | Discharge status: Home / Self Care | Payer: Managed Care, Other (non HMO) | Source: Ambulatory Visit | Attending: Radiation Oncology | Admitting: Radiation Oncology

## 2014-04-17 VITALS — BP 115/77 | HR 66 | Temp 98.0°F | Ht 71.0 in | Wt 177.1 lb

## 2014-04-17 DIAGNOSIS — C61 Malignant neoplasm of prostate: Secondary | ICD-10-CM | POA: Diagnosis not present

## 2014-04-17 NOTE — Progress Notes (Signed)
Danny Odom reports loose stool this weekend and states this happens at least once weekly since starting treatment.  Denies any changes in urination.

## 2014-04-17 NOTE — Progress Notes (Signed)
Weekly Management Note:  Site: Prostate bed/pelvic lymph nodes Current Dose:  6200  cGy Projected Dose: 6600  cGy  Narrative: The patient is seen today for routine under treatment assessment. CBCT/MVCT images/port films were reviewed. The chart was reviewed.   Bladder filling is excellent.  No new GU or GI difficulties.  He does have some loosening of his bowels over the weekend.  Physical Examination:  Filed Vitals:   04/17/14 1707  BP: 115/77  Pulse: 66  Temp: 98 F (36.7 C)  .  Weight: 177 lb 1.6 oz (80.332 kg).  No change.  Impression: Tolerating radiation therapy well.  He will finish his radiation therapy this Thursday.  Plan: Continue radiation therapy as planned.  One-month follow-up visit after completion of radiation therapy.

## 2014-04-18 ENCOUNTER — Ambulatory Visit: Payer: Managed Care, Other (non HMO)

## 2014-04-18 ENCOUNTER — Ambulatory Visit
Admission: RE | Admit: 2014-04-18 | Discharge: 2014-04-18 | Disposition: A | Discharge status: Home / Self Care | Payer: Managed Care, Other (non HMO) | Source: Ambulatory Visit | Attending: Radiation Oncology | Admitting: Radiation Oncology

## 2014-04-18 DIAGNOSIS — C61 Malignant neoplasm of prostate: Secondary | ICD-10-CM | POA: Diagnosis not present

## 2014-04-19 ENCOUNTER — Ambulatory Visit
Admission: RE | Admit: 2014-04-19 | Discharge: 2014-04-19 | Disposition: A | Discharge status: Home / Self Care | Payer: Managed Care, Other (non HMO) | Source: Ambulatory Visit | Attending: Radiation Oncology | Admitting: Radiation Oncology

## 2014-04-19 ENCOUNTER — Ambulatory Visit: Payer: Managed Care, Other (non HMO)

## 2014-04-19 DIAGNOSIS — C61 Malignant neoplasm of prostate: Secondary | ICD-10-CM | POA: Diagnosis not present

## 2014-04-20 ENCOUNTER — Encounter: Payer: Self-pay | Admitting: Radiation Oncology

## 2014-04-20 NOTE — Progress Notes (Signed)
[facility] End of Treatment Note  Name:Danny Odom  Date: 04/20/2014 WYO:[id number] DOB:1950-12-14   Status:outpatient    CC: Rusty Aus., MD  Dr. Phebe Colla  REFERRING PHYSICIAN:  Dr. Phebe Colla   DIAGNOSIS:  Pathologic stage T3a N1  adenocarcinoma prostate  INDICATION FOR TREATMENT: Curative   TREATMENT DATES: 03/05/2014 through 04/19/2014                          SITE/DOSE: Prostate bed 6600 cGy in 33 sessions, pelvic lymph nodes 5000 445 cGy in 33 sessions                           BEAMS/ENERGY:   6 MV photons, dual ARC VMAT IMRT                NARRATIVE:  [name] tolerated his treatment well with only slight loosening of his bowels by completion of therapy.  No significant GU toxicity.                          PLAN: Routine followup in one month. Patient instructed to call if questions or worsening complaints in interim.

## 2014-04-26 ENCOUNTER — Ambulatory Visit: Payer: Managed Care, Other (non HMO)

## 2014-05-22 ENCOUNTER — Ambulatory Visit: Payer: Managed Care, Other (non HMO) | Admitting: Radiation Oncology

## 2014-06-01 ENCOUNTER — Encounter: Payer: Self-pay | Admitting: Radiation Oncology

## 2014-06-04 ENCOUNTER — Encounter: Payer: Self-pay | Admitting: Radiation Oncology

## 2014-06-04 ENCOUNTER — Ambulatory Visit
Admission: RE | Admit: 2014-06-04 | Discharge: 2014-06-04 | Disposition: A | Discharge status: Home / Self Care | Payer: Managed Care, Other (non HMO) | Source: Ambulatory Visit | Attending: Radiation Oncology | Admitting: Radiation Oncology

## 2014-06-04 VITALS — BP 117/62 | HR 62 | Temp 97.8°F | Resp 20 | Wt 179.0 lb

## 2014-06-04 DIAGNOSIS — C61 Malignant neoplasm of prostate: Secondary | ICD-10-CM

## 2014-06-04 HISTORY — DX: Personal history of irradiation: Z92.3

## 2014-06-04 NOTE — Progress Notes (Signed)
CC: Dr. Phebe Colla, Dr. Emily Filbert  Follow-up note:  [name] returns today approximately 6 weeks followed completion of radiotherapy to his prostate bed and pelvic lymph nodes in the management of his pathologic stage T3a N1 adenocarcinoma the prostate.  He believes that his GU and GI habits have almost returned to his baseline.  He is pleased with his progress.  He tells me that a PSA obtained closer to home in [place] was down to 0.3 a few weeks ago.  He saw Dr. Tresa Odom a couple weeks ago and he believes that he will see him back in June.  Physical examination: Alert and oriented. Filed Vitals:   06/04/14 0812  BP: 117/62  Pulse: 62  Temp: 97.8 F (36.6 C)  Resp: 20   Rectal examination not performed today.  Impression: Satisfactory progress and recovery from his radiation therapy.  It is encouraging to see his PSA fall from 0.7 to 0.3 this soon after his radiation therapy.  Plan: Follow-up through Dr. Tresa Odom.  I've not scheduled [name] for a formal follow-up visit and I ask that Dr. Tresa Odom keep me posted on his progress and PSA determinations.

## 2014-06-04 NOTE — Progress Notes (Signed)
Follow up prostate ca rad txs 03/05/14-04/19/14, no pain, no dysuria, no hematuria, good stream, no bowel problems, appetite good, fatigue going away,improving 8:14 AM

## 2015-02-10 HISTORY — PX: KNEE ARTHROSCOPY: SHX127

## 2015-08-29 ENCOUNTER — Telehealth: Payer: Self-pay | Admitting: Cardiology

## 2015-08-29 NOTE — Telephone Encounter (Signed)
Returned call to [employer]. She was not available at the moment. She would be told and call back.

## 2015-08-29 NOTE — Telephone Encounter (Signed)
New message      Dr. Sabra Heck wants the pt seen ASAP for Dr. Stanford Breed DX; Arotic Stentosis/the office is on Epic

## 2015-08-29 NOTE — Telephone Encounter (Signed)
Can add on at 7:30 in AM (7/21) if pt available. Danny Odom

## 2015-08-29 NOTE — Telephone Encounter (Signed)
Returned call to [employer] with Dr.Millner's office at [facility] in [place].She stated Dr.Millner wants Dr.Crenshaw to see patient for worsening aortic stenosis.Patient having sob.No chest pain.No syncope.Stated he had appointment with Dr.Kevin Aline Brochure at [facility] the end of Sept but Dr.Millner wants him seen sooner and prefers pt to see Dr.Crenshaw. Patient has never seen Dr.Crenshaw.Stated echo and his records can be seen in care everywhere.Advised I will send message to Dr.Crenshaw and his RN Hilda Blades.

## 2015-08-29 NOTE — Telephone Encounter (Signed)
Follow up ° ° ° ° ° °Returning a call to the nurse °

## 2015-08-30 NOTE — Telephone Encounter (Signed)
Spoke with pt, Follow up scheduled  

## 2015-09-02 ENCOUNTER — Encounter: Payer: Self-pay | Admitting: Cardiology

## 2015-09-03 ENCOUNTER — Telehealth: Payer: Self-pay | Admitting: Cardiology

## 2015-09-03 DIAGNOSIS — I10 Essential (primary) hypertension: Secondary | ICD-10-CM | POA: Insufficient documentation

## 2015-09-03 DIAGNOSIS — R011 Cardiac murmur, unspecified: Secondary | ICD-10-CM

## 2015-09-03 NOTE — Telephone Encounter (Signed)
Received records from [place] - Dr Emily Filbert for appointment on 09/20/15 with Dr Stanford Breed.  Records given to [employer] (medical records) for Dr Jacalyn Lefevre schedule on 09/20/15. lp

## 2015-09-11 NOTE — Progress Notes (Signed)
HPI: 65 year old male for evaluation of aortic stenosis. Laboratories April 2017 showed hemoglobin 14.8, potassium 4.8, BUN 23 and creatinine 1.3. Liver functions normal. TSH 8.178. Stress echocardiogram in [place] May 2014 normal. Echocardiogram in [facility] April 2016 showed normal LV systolic function. There was mild right ventricular enlargement. Moderate aortic stenosis with mean gradient 30 mmHg and moderate aortic insufficiency. Mild mitral regurgitation. Patient had prostatectomy approximately 1-1/2 years ago. He has struggled with dyspnea on exertion since then. However it has worsened in the past 6 months. There is no orthopnea, PND, pedal edema, chest pain or syncope.  Current Outpatient Prescriptions  Medication Sig Dispense Refill  . amlodipine-benazepril (LOTREL) 2.5-10 MG per capsule Take 1 capsule by mouth every morning.    [name] aspirin 81 MG tablet Take 81 mg by mouth daily.    . cetirizine (ZYRTEC) 10 MG tablet Take 10 mg by mouth daily.    [name] levothyroxine (SYNTHROID, LEVOTHROID) 75 MCG tablet Take 75 mcg by mouth daily before breakfast.    . Multiple Vitamins-Minerals (CENTRUM SILVER ADULT 50+ PO) Take 1 tablet by mouth.     No current facility-administered medications for this visit.     No Known Allergies   Past Medical History:  Diagnosis Date  . Aortic stenosis   . Arthritis    lower spine- 2012  . Cardiac murmur   . ED (erectile dysfunction)   . Esophageal reflux   . Hyperlipidemia   . Hypertension   . Hypothyroid   . Prostate cancer ([place]) 10/20/13  . Prostate cancer ([place])   . S/P radiation therapy 03/05/2014 through 04/19/2014   Prostate bed 6600 cGy in 33 sessions, pelvic lymph nodes 5000 445 cGy in 33 sessions   . Stress incontinence     Past Surgical History:  Procedure Laterality Date  . COLONOSCOPY    . LYMPHADENECTOMY Bilateral 10/20/2013   Procedure: BILATERAL PELVIC LYMPH NODE DISSECTION;  Surgeon: Alexis Frock,  MD;  Location: [facility];  Service: Urology;  Laterality: Bilateral;  . ROBOT ASSISTED LAPAROSCOPIC RADICAL PROSTATECTOMY N/A 10/20/2013   Procedure: ROBOTIC ASSISTED LAPAROSCOPIC RADICAL PROSTATECTOMY WITH INDOCYANINE GREEN DYE;  Surgeon: Alexis Frock, MD;  Location: [facility];  Service: Urology;  Laterality: N/A;  . TONSILLECTOMY    . varicocele surgery  Right     Social History   Social History  . Marital status: Divorced    Spouse name: N/A  . Number of children: 2  . Years of education: N/A   Occupational History  . Not on file.   Social History Main Topics  . Smoking status: Former Smoker    Packs/day: 1.00    Years: 20.00    Types: Cigarettes    Quit date: 02/15/1983  . Smokeless tobacco: Never Used  . Alcohol use 0.0 oz/week     Comment: rarely   . Drug use: No  . Sexual activity: Not on file   Other Topics Concern  . Not on file   Social History Narrative  . No narrative on file    Family History  Problem Relation Age of Onset  . Emphysema Father   . Prostate cancer Father   . Breast cancer Mother     ROS:  Hip arthralgias but no fevers or chills, productive cough, hemoptysis, dysphasia, odynophagia, melena, hematochezia, dysuria, hematuria, rash, seizure activity, orthopnea, PND, pedal edema, claudication. Remaining systems are negative.  Physical Exam:   Blood pressure 126/62, pulse 70, height 5\' 11"  (1.803 m), weight 192 lb (87.1  kg).  General:  Well developed/well nourished in NAD Skin warm/dry Patient not depressed No peripheral clubbing Back-normal HEENT-normal/normal eyelids Neck supple/normal carotid upstroke bilaterally; no bruits; no JVD; no thyromegaly chest - CTA/ normal expansion CV - RRR/normal S1 and S2; no rubs or gallops;  PMI nondisplaced, 2/6 systolic murmur left sternal border. Abdomen -NT/ND, no HSM, no mass, + bowel sounds, positive bruit 2+ femoral pulses, no bruits Ext-no edema, chords, 2+ DP Neuro-grossly nonfocal  ECG Sinus  rhythm with occasional PACs. Nonspecific ST changes.  A/P  1 Aortic stenosis/aortic insufficiency-patient is describing increased dyspnea on exertion. I'm concerned this may be related to his valvular heart disease. However his aortic stenosis does not sound severe on examination. We will proceed with an echocardiogram to further assess. He may require aortic valve replacement in the future. Note he has had symptoms since his prostatectomy 1-1/2 years ago. I wonder if deconditioning may be contributing to his symptoms. If he needs aortic valve replacement he will require cardiac catheterization prior to surgery.  2 hypertension-blood pressure is controlled. Continue present medications.  3 hyperlipidemia-management per primary care.  4 bruit-schedule abdominal ultrasound to exclude aneurysm.  [name], MD

## 2015-09-20 ENCOUNTER — Ambulatory Visit (INDEPENDENT_AMBULATORY_CARE_PROVIDER_SITE_OTHER): Payer: BLUE CROSS/BLUE SHIELD | Admitting: Cardiology

## 2015-09-20 ENCOUNTER — Encounter: Payer: Self-pay | Admitting: Cardiology

## 2015-09-20 VITALS — BP 126/62 | HR 70 | Ht 71.0 in | Wt 192.0 lb

## 2015-09-20 DIAGNOSIS — I350 Nonrheumatic aortic (valve) stenosis: Secondary | ICD-10-CM

## 2015-09-20 DIAGNOSIS — R011 Cardiac murmur, unspecified: Secondary | ICD-10-CM | POA: Diagnosis not present

## 2015-09-20 DIAGNOSIS — I351 Nonrheumatic aortic (valve) insufficiency: Secondary | ICD-10-CM

## 2015-09-20 DIAGNOSIS — R0989 Other specified symptoms and signs involving the circulatory and respiratory systems: Secondary | ICD-10-CM | POA: Diagnosis not present

## 2015-09-20 DIAGNOSIS — I10 Essential (primary) hypertension: Secondary | ICD-10-CM

## 2015-09-20 NOTE — Patient Instructions (Signed)
Medication Instructions:  Your physician recommends that you continue on your current medications as directed. Please refer to the Current Medication list given to you today.   Labwork: -None  Testing/Procedures: Your physician has requested that you have an echocardiogram.DX: Aortic Stenosis/Aortic Insuffiencey.  Echocardiography is a painless test that uses sound waves to create images of your heart. It provides your doctor with information about the size and shape of your heart and how well your heart's chambers and valves are working. This procedure takes approximately one hour. There are no restrictions for this procedure.  Please schedule Abdominal US at [place]: Your physician recommends that you schedule a follow-up appointment in 4 weeks.  Per Dr. Stanford Breed  Any Other Special Instructions Will Be Listed Below (If Applicable).     If you need a refill on your cardiac medications before your next appointment, please call your pharmacy.

## 2015-09-30 ENCOUNTER — Other Ambulatory Visit: Payer: BLUE CROSS/BLUE SHIELD

## 2015-10-04 ENCOUNTER — Ambulatory Visit (HOSPITAL_COMMUNITY): Payer: BLUE CROSS/BLUE SHIELD | Attending: Cardiology

## 2015-10-04 ENCOUNTER — Ambulatory Visit
Admission: RE | Admit: 2015-10-04 | Discharge: 2015-10-04 | Disposition: A | Discharge status: Home / Self Care | Payer: BLUE CROSS/BLUE SHIELD | Source: Ambulatory Visit | Attending: Cardiology | Admitting: Cardiology

## 2015-10-04 ENCOUNTER — Other Ambulatory Visit: Payer: Self-pay

## 2015-10-04 ENCOUNTER — Telehealth: Payer: Self-pay | Admitting: *Deleted

## 2015-10-04 DIAGNOSIS — I10 Essential (primary) hypertension: Secondary | ICD-10-CM | POA: Diagnosis not present

## 2015-10-04 DIAGNOSIS — R29898 Other symptoms and signs involving the musculoskeletal system: Secondary | ICD-10-CM | POA: Diagnosis not present

## 2015-10-04 DIAGNOSIS — R0989 Other specified symptoms and signs involving the circulatory and respiratory systems: Secondary | ICD-10-CM

## 2015-10-04 DIAGNOSIS — I350 Nonrheumatic aortic (valve) stenosis: Secondary | ICD-10-CM | POA: Diagnosis present

## 2015-10-04 DIAGNOSIS — E785 Hyperlipidemia, unspecified: Secondary | ICD-10-CM | POA: Diagnosis not present

## 2015-10-04 DIAGNOSIS — I352 Nonrheumatic aortic (valve) stenosis with insufficiency: Secondary | ICD-10-CM | POA: Insufficient documentation

## 2015-10-04 DIAGNOSIS — I351 Nonrheumatic aortic (valve) insufficiency: Secondary | ICD-10-CM | POA: Diagnosis not present

## 2015-10-04 DIAGNOSIS — I77810 Thoracic aortic ectasia: Secondary | ICD-10-CM | POA: Diagnosis not present

## 2015-10-04 LAB — ECHOCARDIOGRAM COMPLETE
AO mean calculated velocity dopler: 206 cm/s
AV Area VTI: 1.19 cm2
AV Area mean vel: 1.16 cm2
AV Peak grad: 44 mmHg
AV VEL mean LVOT/AV: 0.41
AV area mean vel ind: 0.56 cm2/m2
AV peak Index: 0.57
AV vel: 1.27
AVAREAVTIIND: 0.61 cm2/m2
AVG: 21 mmHg
AVPHT: 664 ms
AVPKVEL: 330 cm/s
Ao pk vel: 0.42 m/s
Ao-asc: 40 cm
CHL CUP MV DEC (S): 356
E/e' ratio: 5.02
EWDT: 356 ms
FS: 31 % (ref 28–44)
IVS/LV PW RATIO, ED: 0.94
LA ID, A-P, ES: 29 mm
LA diam index: 1.4 cm/m2
LA vol index: 29.5 mL/m2
LAVOL: 61 mL
LAVOLA4C: 50 mL
LEFT ATRIUM END SYS DIAM: 29 mm
LV E/e'average: 5.02
LV PW d: 11.7 mm — AB (ref 0.6–1.1)
LV SIMPSON'S DISK: 65
LV dias vol: 86 mL (ref 62–150)
LV sys vol index: 14 mL/m2
LVDIAVOLIN: 42 mL/m2
LVEEMED: 5.02
LVELAT: 9.55 cm/s
LVOT VTI: 26.7 cm
LVOT area: 2.84 cm2
LVOT diameter: 19 mm
LVOT peak grad rest: 8 mmHg
LVOTPV: 138 cm/s
LVOTSV: 76 mL
LVOTVTI: 0.45 cm
LVSYSVOL: 30 mL (ref 21–61)
Lateral S' vel: 14.1 cm/s
MVPKAVEL: 50.3 m/s
MVPKEVEL: 47.9 m/s
RV sys press: 28 mmHg
Reg peak vel: 250 cm/s
Stroke v: 56 ml
TDI e' lateral: 9.55
TDI e' medial: 7.99
TRMAXVEL: 250 cm/s
VTI: 59.8 cm
Valve area index: 0.61
Valve area: 1.27 cm2

## 2015-10-04 NOTE — Telephone Encounter (Signed)
Caller from Imaging center spoke w/ me to report findings from [place] of abdomen today. Noted impressions from Dr. Golden Circle w/ specific attention to:  "Suggestion of abdominal aortic aneurysm with evidence of dissection although this is incompletely evaluated on this exam. Given the patient's clinical history a a CTA of the abdomen and pelvis is recommended for further evaluation."  Aware that I will route to provider for review and recommendations.

## 2015-10-08 ENCOUNTER — Telehealth: Payer: Self-pay | Admitting: *Deleted

## 2015-10-08 DIAGNOSIS — R931 Abnormal findings on diagnostic imaging of heart and coronary circulation: Secondary | ICD-10-CM

## 2015-10-08 DIAGNOSIS — Z01812 Encounter for preprocedural laboratory examination: Secondary | ICD-10-CM

## 2015-10-08 DIAGNOSIS — R935 Abnormal findings on diagnostic imaging of other abdominal regions, including retroperitoneum: Secondary | ICD-10-CM

## 2015-10-08 NOTE — Telephone Encounter (Signed)
-----   Message from [name], MD sent at 10/04/2015  2:56 PM EDT ----- Please have pt return for limited study with definity to evaluate LV apex and doppler mid LV and LVOT [name]

## 2015-10-08 NOTE — Telephone Encounter (Signed)
-----   Message from [name], MD sent at 10/04/2015 10:14 AM EDT ----- Please arrange CTA of thoracic, abdominal and pelvic aorta. [name]

## 2015-10-08 NOTE — Telephone Encounter (Signed)
Advised patient and will fax order for labs in [place] to [phone]

## 2015-10-10 ENCOUNTER — Telehealth: Payer: Self-pay | Admitting: Cardiology

## 2015-10-10 LAB — BASIC METABOLIC PANEL
BUN/Creatinine Ratio: 14 (ref 10–24)
BUN: 17 mg/dL (ref 8–27)
CO2: 27 mmol/L (ref 18–29)
CREATININE: 1.19 mg/dL (ref 0.76–1.27)
Calcium: 9.1 mg/dL (ref 8.6–10.2)
Chloride: 102 mmol/L (ref 96–106)
GFR calc Af Amer: 73 mL/min/{1.73_m2} (ref >59)
GFR calc non Af Amer: 63 mL/min/{1.73_m2} (ref >59)
GLUCOSE: 94 mg/dL (ref 65–99)
Potassium: 4.6 mmol/L (ref 3.5–5.2)
Sodium: 143 mmol/L (ref 134–144)

## 2015-10-10 NOTE — Telephone Encounter (Signed)
New Message ° °Pt states he received a call. Please call back to discuss  °

## 2015-10-11 ENCOUNTER — Ambulatory Visit (INDEPENDENT_AMBULATORY_CARE_PROVIDER_SITE_OTHER)
Admission: RE | Admit: 2015-10-11 | Discharge: 2015-10-11 | Disposition: A | Discharge status: Home / Self Care | Payer: BLUE CROSS/BLUE SHIELD | Source: Ambulatory Visit | Attending: Cardiology | Admitting: Cardiology

## 2015-10-11 ENCOUNTER — Other Ambulatory Visit: Payer: Self-pay

## 2015-10-11 ENCOUNTER — Other Ambulatory Visit: Payer: Self-pay | Admitting: Cardiology

## 2015-10-11 ENCOUNTER — Ambulatory Visit (HOSPITAL_COMMUNITY): Payer: BLUE CROSS/BLUE SHIELD | Attending: Internal Medicine

## 2015-10-11 DIAGNOSIS — I352 Nonrheumatic aortic (valve) stenosis with insufficiency: Secondary | ICD-10-CM | POA: Insufficient documentation

## 2015-10-11 DIAGNOSIS — R935 Abnormal findings on diagnostic imaging of other abdominal regions, including retroperitoneum: Secondary | ICD-10-CM

## 2015-10-11 DIAGNOSIS — E785 Hyperlipidemia, unspecified: Secondary | ICD-10-CM | POA: Insufficient documentation

## 2015-10-11 DIAGNOSIS — I10 Essential (primary) hypertension: Secondary | ICD-10-CM | POA: Diagnosis not present

## 2015-10-11 DIAGNOSIS — R931 Abnormal findings on diagnostic imaging of heart and coronary circulation: Secondary | ICD-10-CM | POA: Insufficient documentation

## 2015-10-11 DIAGNOSIS — Z87891 Personal history of nicotine dependence: Secondary | ICD-10-CM | POA: Diagnosis not present

## 2015-10-11 LAB — ECHOCARDIOGRAM LIMITED
AOPV: 0.39 m/s
AV Area mean vel: 1.79 cm2
AV Mean grad: 26 mmHg
AV Peak grad: 48 mmHg
AV VEL mean LVOT/AV: 0.4
AV peak Index: 0.85
AV pk vel: 346 cm/s
AVA: 1.81 cm2
AVAREAMEANVIN: 0.86 cm2/m2
AVAREAVTI: 1.75 cm2
AVAREAVTIIND: 0.87 cm2/m2
Ao-asc: 39 cm
CHL CUP AV VALUE AREA INDEX: 0.87
CHL CUP AV VEL: 1.81
DOP CAL AO MEAN VELOCITY: 233 cm/s
FS: 17 % — AB (ref 28–44)
IVS/LV PW RATIO, ED: 0.88
LA diam index: 1.59 cm/m2
LASIZE: 33 mm
LDCA: 4.52 cm2
LEFT ATRIUM END SYS DIAM: 33 mm
LVOT SV: 124 mL
LVOT VTI: 27.4 cm
LVOT diameter: 24 mm
LVOT peak VTI: 0.4 cm
LVOT peak grad rest: 7 mmHg
LVOT peak vel: 134 cm/s
P 1/2 time: 890 ms
PW: 12.3 mm — AB (ref 0.6–1.1)
VTI: 68.4 cm

## 2015-10-11 MED ORDER — IOPAMIDOL (ISOVUE-370) INJECTION 76%
100.0000 mL | Freq: Once | INTRAVENOUS | Status: AC | PRN
  Administered 2015-10-11: 100 mL via INTRAVENOUS

## 2015-10-11 MED ORDER — PERFLUTREN LIPID MICROSPHERE
1.0000 mL | INTRAVENOUS | Status: AC | PRN
  Administered 2015-10-11: 2 mL via INTRAVENOUS

## 2015-10-24 NOTE — Progress Notes (Signed)
HPI: FU aortic stenosis. Stress echocardiogram in [place] May 2014 normal. Echocardiogram in [facility] April 2016 showed normal LV systolic function. There was mild right ventricular enlargement. Moderate aortic stenosis with mean gradient 30 mmHg and moderate aortic insufficiency. Mild mitral regurgitation. Patient had prostatectomy approximately 1-1/2 years ago. He has struggled with dyspnea on exertion since then. Echocardiogram August 2017 showed normal LV systolic function, possible akinesis of the apical myocardium with thrombus, moderate aortic stenosis with mean gradient 28 mmHg and moderate aortic insufficiency. Mildly dilated aortic root. Study repeated with Definity and there was no thrombus. CTA September 2017 showed dilated aortic root at 4.2 x 4 cm, no abd aneurysm or dissection. Since last seen, he is able to perform the majority of his activities with no symptoms. With vigorous activities he notes some dyspnea. He has not had chest pain or syncope. No pedal edema.  Current Outpatient Prescriptions  Medication Sig Dispense Refill  . amLODipine (NORVASC) 2.5 MG tablet Take 2.5 mg by mouth daily.    [name] aspirin 81 MG tablet Take 81 mg by mouth daily.    . cetirizine (ZYRTEC) 10 MG tablet Take 10 mg by mouth daily.    [name] levothyroxine (SYNTHROID, LEVOTHROID) 75 MCG tablet Take 75 mcg by mouth daily before breakfast.    . lisinopril (PRINIVIL,ZESTRIL) 10 MG tablet Take 10 mg by mouth daily.    . Multiple Vitamins-Minerals (CENTRUM SILVER ADULT 50+ PO) Take 1 tablet by mouth.     No current facility-administered medications for this visit.      Past Medical History:  Diagnosis Date  . Aortic stenosis   . Arthritis    lower spine- 2012  . Cardiac murmur   . ED (erectile dysfunction)   . Esophageal reflux   . Hyperlipidemia   . Hypertension   . Hypothyroid   . Prostate cancer ([place]) 10/20/13  . Prostate cancer ([place])   . S/P radiation therapy 03/05/2014 through 04/19/2014    Prostate bed 6600 cGy in 33 sessions, pelvic lymph nodes 5000 445 cGy in 33 sessions   . Stress incontinence     Past Surgical History:  Procedure Laterality Date  . COLONOSCOPY    . LYMPHADENECTOMY Bilateral 10/20/2013   Procedure: BILATERAL PELVIC LYMPH NODE DISSECTION;  Surgeon: Alexis Frock, MD;  [facility];  Service: Urology;  Laterality: Bilateral;  . ROBOT ASSISTED LAPAROSCOPIC RADICAL PROSTATECTOMY N/A 10/20/2013   Procedure: ROBOTIC ASSISTED LAPAROSCOPIC RADICAL PROSTATECTOMY WITH INDOCYANINE GREEN DYE;  Surgeon: Alexis Frock, MD;  [facility];  Service: Urology;  Laterality: N/A;  . TONSILLECTOMY    . varicocele surgery  Right     Social History   Social History  . Marital status: Divorced    Spouse name: N/A  . Number of children: 2  . Years of education: N/A   Occupational History  . Not on file.   Social History Main Topics  . Smoking status: Former Smoker    Packs/day: 1.00    Years: 20.00    Types: Cigarettes    Quit date: 02/15/1983  . Smokeless tobacco: Never Used  . Alcohol use 0.0 oz/week     Comment: rarely   . Drug use: No  . Sexual activity: Not on file   Other Topics Concern  . Not on file   Social History Narrative  . No narrative on file    Family History  Problem Relation Age of Onset  . Emphysema Father   . Prostate cancer  Father   . Breast cancer Mother     ROS: no fevers or chills, productive cough, hemoptysis, dysphasia, odynophagia, melena, hematochezia, dysuria, hematuria, rash, seizure activity, orthopnea, PND, pedal edema, claudication. Remaining systems are negative.  Physical Exam: Well-developed well-nourished in no acute distress.  Skin is warm and dry.  HEENT is normal.  Neck is supple.  Chest is clear to auscultation with normal expansion.  Cardiovascular exam is regular rate and rhythm. 2/6 systolic murmur left sternal border. Abdominal exam nontender or distended. No masses  palpated. Extremities show no edema. neuro grossly intact  A/P  1 aortic stenosis/aortic insufficiency-his echocardiogram shows moderate aortic stenosis and aortic insufficiency. He is able to perform the majority of his activities without symptoms. He does have some dyspnea with more extreme activities. This dates back to the time of his prostate surgery followed by a period of inactivity. It is not clear to me that his aortic valve is responsible for all of his symptoms. However it certainly could be contributing. We discussed options today. I feel we can follow for now with repeat echocardiograms in the future and he is in agreement. We will see him back in 6 months unless his symptoms worsen. We will consider a stress echocardiogram at that time. We discussed pursuing that today that he would like to defer. He will likely require aortic valve replacement in the future.  2 hypertension-blood pressure controlled. Continue present medications.  3 hyperlipidemia-continue statin.  [name], MD

## 2015-10-30 ENCOUNTER — Encounter: Payer: Self-pay | Admitting: Cardiology

## 2015-10-30 ENCOUNTER — Ambulatory Visit (INDEPENDENT_AMBULATORY_CARE_PROVIDER_SITE_OTHER): Payer: BLUE CROSS/BLUE SHIELD | Admitting: Cardiology

## 2015-10-30 VITALS — BP 134/78 | HR 84 | Ht 71.0 in | Wt 193.0 lb

## 2015-10-30 DIAGNOSIS — I350 Nonrheumatic aortic (valve) stenosis: Secondary | ICD-10-CM | POA: Diagnosis not present

## 2015-10-30 DIAGNOSIS — E785 Hyperlipidemia, unspecified: Secondary | ICD-10-CM

## 2015-10-30 DIAGNOSIS — I10 Essential (primary) hypertension: Secondary | ICD-10-CM

## 2015-10-30 DIAGNOSIS — I351 Nonrheumatic aortic (valve) insufficiency: Secondary | ICD-10-CM | POA: Diagnosis not present

## 2015-10-30 NOTE — Patient Instructions (Signed)
Your physician wants you to follow-up in: 6 MONTHS WITH DR CRENSHAW You will receive a reminder letter in the mail two months in advance. If you don't receive a letter, please call our office to schedule the follow-up appointment.   If you need a refill on your cardiac medications before your next appointment, please call your pharmacy.  

## 2016-04-20 NOTE — Progress Notes (Signed)
HPI: FU aortic stenosis. Stress echocardiogram in [place] May 2014 normal. Echocardiogram in [facility] April 2016 showed normal LV systolic function. There was mild right ventricular enlargement. Moderate aortic stenosis with mean gradient 30 mmHg and moderate aortic insufficiency. Mild mitral regurgitation. Patient had prostatectomy approximately 1-1/2 years ago. He has struggled with dyspnea on exertion since then. Echocardiogram August 2017 showed normal LV systolic function, possible akinesis of the apical myocardium with thrombus, moderate aortic stenosis with mean gradient 28 mmHg and moderate aortic insufficiency. Mildly dilated aortic root. Study repeated with Definity and there was no thrombus. CTA September 2017 showed dilated aortic root at 4.2 x 4 cm, no abd aneurysm or dissection. Since last seen, he has dyspnea with more vigorous activities but not routine activities. No orthopnea, PND or pedal edema. No exertional chest pain. Some dizziness with vigorous activities but no syncope.  Current Outpatient Prescriptions  Medication Sig Dispense Refill  . amLODipine (NORVASC) 2.5 MG tablet Take 2.5 mg by mouth daily.    [name] aspirin 81 MG tablet Take 81 mg by mouth daily.    . cetirizine (ZYRTEC) 10 MG tablet Take 10 mg by mouth daily.    [name] levothyroxine (SYNTHROID, LEVOTHROID) 75 MCG tablet Take 75 mcg by mouth daily before breakfast.    . lisinopril (PRINIVIL,ZESTRIL) 10 MG tablet Take 10 mg by mouth daily.    . Multiple Vitamins-Minerals (CENTRUM SILVER ADULT 50+ PO) Take 1 tablet by mouth.     No current facility-administered medications for this visit.      Past Medical History:  Diagnosis Date  . Aortic stenosis   . Arthritis    lower spine- 2012  . Cardiac murmur   . ED (erectile dysfunction)   . Esophageal reflux   . Hyperlipidemia   . Hypertension   . Hypothyroid   . Prostate cancer ([place]) 10/20/13  . Prostate cancer ([place])   . S/P radiation therapy 03/05/2014  through 04/19/2014   Prostate bed 6600 cGy in 33 sessions, pelvic lymph nodes 5000 445 cGy in 33 sessions   . Stress incontinence     Past Surgical History:  Procedure Laterality Date  . COLONOSCOPY    . LYMPHADENECTOMY Bilateral 10/20/2013   Procedure: BILATERAL PELVIC LYMPH NODE DISSECTION;  Surgeon: Alexis Frock, MD;  [facility];  Service: Urology;  Laterality: Bilateral;  . ROBOT ASSISTED LAPAROSCOPIC RADICAL PROSTATECTOMY N/A 10/20/2013   Procedure: ROBOTIC ASSISTED LAPAROSCOPIC RADICAL PROSTATECTOMY WITH INDOCYANINE GREEN DYE;  Surgeon: Alexis Frock, MD;  [facility];  Service: Urology;  Laterality: N/A;  . TONSILLECTOMY    . varicocele surgery  Right     Social History   Social History  . Marital status: Divorced    Spouse name: N/A  . Number of children: 2  . Years of education: N/A   Occupational History  . Not on file.   Social History Main Topics  . Smoking status: Former Smoker    Packs/day: 1.00    Years: 20.00    Types: Cigarettes    Quit date: 02/15/1983  . Smokeless tobacco: Never Used  . Alcohol use 0.0 oz/week     Comment: rarely   . Drug use: No  . Sexual activity: Not on file   Other Topics Concern  . Not on file   Social History Narrative  . No narrative on file    Family History  Problem Relation Age of Onset  . Emphysema Father   . Prostate cancer Father   .  Breast cancer Mother     ROS: no fevers or chills, productive cough, hemoptysis, dysphasia, odynophagia, melena, hematochezia, dysuria, hematuria, rash, seizure activity, orthopnea, PND, pedal edema, claudication. Remaining systems are negative.  Physical Exam: Well-developed well-nourished in no acute distress.  Skin is warm and dry.  HEENT is normal.  Neck is supple.  Chest is clear to auscultation with normal expansion.  Cardiovascular exam is regular rate and rhythm. 2/6 systolic murmur sternal border. 1/6 diastolic murmur.   Abdominal exam  nontender or distended. No masses palpated. Extremities show no edema. neuro grossly intact  ECG- Normal sinus rhythm at a rate of 76. No significant ST changes. personally reviewed  A/P  1 Aortic stenosis/aortic insufficiency-Patient's last echocardiogram shows moderate aortic stenosis and moderate aortic insufficiency. He continues to do reasonably well from a symptomatic standpoint. He does have some dyspnea with vigorous activities that may be related to deconditioning. It is improving with exercise. He has not had chest pain or syncope. We will plan to repeat his echocardiogram in August. He will contact us with any worsening symptoms. He will ultimately require aortic valve replacement and he understands this.  2 hypertension-look pressure controlled. Continue present medications.  3 hyperlipidemia-continue statin.  4 thoracic aortic aneurysm-plan repeat CTA September 2018.  [name], MD

## 2016-04-23 ENCOUNTER — Encounter: Payer: Self-pay | Admitting: Cardiology

## 2016-05-04 ENCOUNTER — Ambulatory Visit (INDEPENDENT_AMBULATORY_CARE_PROVIDER_SITE_OTHER): Payer: BLUE CROSS/BLUE SHIELD | Admitting: Cardiology

## 2016-05-04 ENCOUNTER — Encounter: Payer: Self-pay | Admitting: Cardiology

## 2016-05-04 VITALS — BP 130/78 | HR 76 | Ht 71.0 in | Wt 195.0 lb

## 2016-05-04 DIAGNOSIS — I712 Thoracic aortic aneurysm, without rupture, unspecified: Secondary | ICD-10-CM

## 2016-05-04 DIAGNOSIS — E7800 Pure hypercholesterolemia, unspecified: Secondary | ICD-10-CM | POA: Diagnosis not present

## 2016-05-04 DIAGNOSIS — I10 Essential (primary) hypertension: Secondary | ICD-10-CM | POA: Diagnosis not present

## 2016-05-04 DIAGNOSIS — I359 Nonrheumatic aortic valve disorder, unspecified: Secondary | ICD-10-CM

## 2016-05-04 NOTE — Patient Instructions (Signed)
Medication Instructions:   NO CHANGE  Testing/Procedures:  Your physician has requested that you have an echocardiogram. Echocardiography is a painless test that uses sound waves to create images of your heart. It provides your doctor with information about the size and shape of your heart and how well your heart's chambers and valves are working. This procedure takes approximately one hour. There are no restrictions for this procedure.SCHEDULE IN [place]    Follow-Up:  Your physician wants you to follow-up in: [place] will receive a reminder letter in the mail two months in advance. If you don't receive a letter, please call our office to schedule the follow-up appointment.   If you need a refill on your cardiac medications before your next appointment, please call your pharmacy.

## 2016-09-22 ENCOUNTER — Ambulatory Visit (HOSPITAL_COMMUNITY): Payer: BLUE CROSS/BLUE SHIELD | Attending: Cardiovascular Disease

## 2016-09-22 ENCOUNTER — Other Ambulatory Visit: Payer: Self-pay

## 2016-09-22 DIAGNOSIS — I359 Nonrheumatic aortic valve disorder, unspecified: Secondary | ICD-10-CM | POA: Insufficient documentation

## 2016-09-22 MED ORDER — PERFLUTREN LIPID MICROSPHERE
1.0000 mL | INTRAVENOUS | Status: AC | PRN
  Administered 2016-09-22: 2 mL via INTRAVENOUS

## 2016-10-15 ENCOUNTER — Telehealth: Payer: Self-pay | Admitting: *Deleted

## 2016-10-15 ENCOUNTER — Encounter: Payer: Self-pay | Admitting: *Deleted

## 2016-10-15 DIAGNOSIS — I712 Thoracic aortic aneurysm, without rupture, unspecified: Secondary | ICD-10-CM

## 2016-10-15 NOTE — Telephone Encounter (Signed)
CTA of chest 10-27-16 at the [address] location @ 11 am. Message with instructions sent to patient via my chart at his request. Lab orders mailed to the pt for bmp prior to CTA.

## 2016-10-15 NOTE — Telephone Encounter (Signed)
Spoke with pt, he is due to have the CTA to follow up his aneurysm. He has another appointment 10-27-16 and would like it scheduled at that time. Will place order, schedule and call the patient back.

## 2016-10-22 ENCOUNTER — Other Ambulatory Visit: Payer: Self-pay

## 2016-10-22 ENCOUNTER — Telehealth: Payer: Self-pay

## 2016-10-22 DIAGNOSIS — I10 Essential (primary) hypertension: Secondary | ICD-10-CM

## 2016-10-22 NOTE — Telephone Encounter (Signed)
Received a call from [facility] in [place] requesting bmet order be fax to lab in [place] at fax # [phone].She requested results be faxed to her at fax # [phone].Bmet order faxed to [phone] advising fax results to [facility].

## 2016-10-23 ENCOUNTER — Encounter: Payer: Self-pay | Admitting: Cardiology

## 2016-10-23 NOTE — Progress Notes (Addendum)
HPI: FU aortic stenosis. Stress echocardiogram in [place] May 2014 normal. Patient had prostatectomy approximately previously. He has struggled with dyspnea on exertion since then. CTA September 2017 showed dilated aortic root at 4.2 x 4 cm, no abd aneurysm or dissection. Echocardiogram repeated August 2018 and showed normal LV systolic function, moderate aortic stenosis with mean gradient 22 mmHg, moderate aortic insufficiency, mildly dilated aortic root, mild left atrial enlargement and mild right ventricular enlargement. Chest CT repeated September 2018 and showed dilated descending thoracic aorta at 4 cm. Since last seen, patient has dyspnea with more vigorous activities but not routine activities. No orthopnea, PND, pedal edema, chest pain, palpitations or syncope.  Current Outpatient Prescriptions  Medication Sig Dispense Refill  . amLODipine (NORVASC) 2.5 MG tablet Take 2.5 mg by mouth daily.    [name] aspirin 81 MG tablet Take 81 mg by mouth daily.    . bicalutamide (CASODEX) 50 MG tablet Take 50 mg by mouth daily.    . cetirizine (ZYRTEC) 10 MG tablet Take 10 mg by mouth daily.    [name] levothyroxine (SYNTHROID, LEVOTHROID) 75 MCG tablet Take 75 mcg by mouth daily before breakfast.    . lisinopril (PRINIVIL,ZESTRIL) 10 MG tablet Take 10 mg by mouth daily.    . Multiple Vitamins-Minerals (CENTRUM SILVER ADULT 50+ PO) Take 1 tablet by mouth.     No current facility-administered medications for this visit.      Past Medical History:  Diagnosis Date  . Aortic stenosis   . Arthritis    lower spine- 2012  . Cardiac murmur   . ED (erectile dysfunction)   . Esophageal reflux   . Hyperlipidemia   . Hypertension   . Hypothyroid   . Prostate cancer ([place]) 10/20/13  . Prostate cancer ([place])   . S/P radiation therapy 03/05/2014 through 04/19/2014   Prostate bed 6600 cGy in 33 sessions, pelvic lymph nodes 5000 445 cGy in 33 sessions   . Stress incontinence      Past Surgical History:  Procedure Laterality Date  . COLONOSCOPY    . LYMPHADENECTOMY Bilateral 10/20/2013   Procedure: BILATERAL PELVIC LYMPH NODE DISSECTION;  Surgeon: Alexis Frock, MD;  [facility];  Service: Urology;  Laterality: Bilateral;  . ROBOT ASSISTED LAPAROSCOPIC RADICAL PROSTATECTOMY N/A 10/20/2013   Procedure: ROBOTIC ASSISTED LAPAROSCOPIC RADICAL PROSTATECTOMY WITH INDOCYANINE GREEN DYE;  Surgeon: Alexis Frock, MD;  [facility];  Service: Urology;  Laterality: N/A;  . TONSILLECTOMY    . varicocele surgery  Right     Social History   Social History  . Marital status: Divorced    Spouse name: N/A  . Number of children: 2  . Years of education: N/A   Occupational History  . Not on file.   Social History Main Topics  . Smoking status: Former Smoker    Packs/day: 1.00    Years: 20.00    Types: Cigarettes    Quit date: 02/15/1983  . Smokeless tobacco: Never Used  . Alcohol use 0.0 oz/week     Comment: rarely   . Drug use: No  . Sexual activity: Not on file   Other Topics Concern  . Not on file   Social History Narrative  . No narrative on file    Family History  Problem Relation Age of Onset  . Emphysema Father   . Prostate cancer Father   . Breast cancer Mother     ROS: no fevers or chills, productive cough, hemoptysis, dysphasia, odynophagia,  melena, hematochezia, dysuria, hematuria, rash, seizure activity, orthopnea, PND, pedal edema, claudication. Remaining systems are negative.  Physical Exam: Well-developed well-nourished in no acute distress.  Skin is warm and dry.  HEENT is normal.  Neck is supple.  Chest is clear to auscultation with normal expansion.  Cardiovascular exam is regular rate and rhythm.  2/6 systolic murmur sternal border. S2 is not diminished. No diastolic murmur.  Abdominal exam nontender or distended. No masses palpated. Extremities show no edema. neuro grossly intact  ECG- Sinus rhythm with PACs. No  significant ST changes. personally reviewed  A/P  1 Aortic stenosis/aortic insufficiency-patient's recent echocardiogram shows moderate aortic stenosis and moderate aortic insufficiency. His symptoms are stable. He has dyspnea with more vigorous activities but appears to be unchanged compared to previous. We will plan to repeat his echocardiogram August 2019. He understands he will likely require aortic valve replacement in the future.  2 hypertension-blood pressure is controlled. Continue present medications and follow.  3 thoracic aortic aneurysm-plan repeat CTA September 2019.  4 hyperlipidemia-continue statin. Lipids and liver are monitored by primary care.  5 prostate cancer-managed by urology. Apparently his PSA has increased compared to previous and he will receive hormone injections in the near future.  [name], MD

## 2016-10-27 ENCOUNTER — Ambulatory Visit (INDEPENDENT_AMBULATORY_CARE_PROVIDER_SITE_OTHER)
Admission: RE | Admit: 2016-10-27 | Discharge: 2016-10-27 | Disposition: A | Discharge status: Home / Self Care | Payer: BLUE CROSS/BLUE SHIELD | Source: Ambulatory Visit | Attending: Cardiology | Admitting: Cardiology

## 2016-10-27 DIAGNOSIS — I712 Thoracic aortic aneurysm, without rupture, unspecified: Secondary | ICD-10-CM

## 2016-10-27 MED ORDER — IOPAMIDOL (ISOVUE-370) INJECTION 76%
100.0000 mL | Freq: Once | INTRAVENOUS | Status: AC | PRN
  Administered 2016-10-27: 100 mL via INTRAVENOUS

## 2016-11-05 ENCOUNTER — Encounter: Payer: Self-pay | Admitting: Cardiology

## 2016-11-05 ENCOUNTER — Ambulatory Visit (INDEPENDENT_AMBULATORY_CARE_PROVIDER_SITE_OTHER): Payer: BLUE CROSS/BLUE SHIELD | Admitting: Cardiology

## 2016-11-05 VITALS — BP 128/62 | HR 56 | Ht 71.0 in | Wt 195.0 lb

## 2016-11-05 DIAGNOSIS — E7800 Pure hypercholesterolemia, unspecified: Secondary | ICD-10-CM | POA: Diagnosis not present

## 2016-11-05 DIAGNOSIS — I712 Thoracic aortic aneurysm, without rupture, unspecified: Secondary | ICD-10-CM

## 2016-11-05 DIAGNOSIS — I350 Nonrheumatic aortic (valve) stenosis: Secondary | ICD-10-CM | POA: Diagnosis not present

## 2016-11-05 DIAGNOSIS — I10 Essential (primary) hypertension: Secondary | ICD-10-CM

## 2016-11-05 NOTE — Patient Instructions (Signed)
Your physician wants you to follow-up in: 6 MONTHS WITH DR CRENSHAW You will receive a reminder letter in the mail two months in advance. If you don't receive a letter, please call our office to schedule the follow-up appointment.   If you need a refill on your cardiac medications before your next appointment, please call your pharmacy.  

## 2017-05-30 ENCOUNTER — Encounter: Payer: Self-pay | Admitting: Cardiology

## 2017-05-31 ENCOUNTER — Other Ambulatory Visit: Payer: Self-pay | Admitting: *Deleted

## 2017-05-31 DIAGNOSIS — I350 Nonrheumatic aortic (valve) stenosis: Secondary | ICD-10-CM

## 2017-08-06 ENCOUNTER — Encounter

## 2017-08-09 ENCOUNTER — Other Ambulatory Visit: Payer: Self-pay

## 2017-08-09 ENCOUNTER — Ambulatory Visit (HOSPITAL_COMMUNITY): Payer: BLUE CROSS/BLUE SHIELD | Attending: Cardiovascular Disease

## 2017-08-09 DIAGNOSIS — I083 Combined rheumatic disorders of mitral, aortic and tricuspid valves: Secondary | ICD-10-CM | POA: Insufficient documentation

## 2017-08-09 DIAGNOSIS — Z87891 Personal history of nicotine dependence: Secondary | ICD-10-CM | POA: Diagnosis not present

## 2017-08-09 DIAGNOSIS — I10 Essential (primary) hypertension: Secondary | ICD-10-CM | POA: Diagnosis not present

## 2017-08-09 DIAGNOSIS — Z8546 Personal history of malignant neoplasm of prostate: Secondary | ICD-10-CM | POA: Insufficient documentation

## 2017-08-09 DIAGNOSIS — I350 Nonrheumatic aortic (valve) stenosis: Secondary | ICD-10-CM

## 2017-08-09 MED ORDER — PERFLUTREN LIPID MICROSPHERE
1.0000 mL | INTRAVENOUS | Status: AC | PRN
  Administered 2017-08-09: 2 mL via INTRAVENOUS

## 2017-08-09 NOTE — H&P (View-Only) (Signed)
HPI: FU aortic stenosis. Stress echocardiogram in  May 2014 normal. Patient had prostatectomy previously. He has struggled with dyspnea on exertion since then. CTA September 2017 showed dilated aortic root at 4.2 x 4 cm, no abd aneurysm or dissection. Echocardiogram repeated August 2018 and showed normal LV systolic function, moderate aortic stenosis with mean gradient 22 mmHg, moderate aortic insufficiency, mildly dilated aortic root, mild left atrial enlargement and mild right ventricular enlargement. Chest CT repeated September 2018 and showed dilated descending thoracic aorta at 4 cm.   Echocardiogram repeated July 2019 and showed normal LV function, functionally bicuspid aortic valve with moderate aortic stenosis (mean gradient 30 mmHg), moderate aortic insufficiency, mild mitral regurgitation.  Since last seen,  patient does have dyspnea with more vigorous activities relieved with rest.  No orthopnea, PND, pedal edema, chest pain or syncope.  Some dizziness with exercise.  Current Outpatient Medications  Medication Sig Dispense Refill  . amLODipine (NORVASC) 2.5 MG tablet Take 2.5 mg by mouth daily.    [name] aspirin 81 MG tablet Take 81 mg by mouth daily.    . cetirizine (ZYRTEC) 10 MG tablet Take 10 mg by mouth daily.    [name] levothyroxine (SYNTHROID, LEVOTHROID) 75 MCG tablet Take 75 mcg by mouth daily before breakfast.    . lisinopril (PRINIVIL,ZESTRIL) 10 MG tablet Take 10 mg by mouth daily.    . Multiple Vitamins-Minerals (CENTRUM SILVER ADULT 50+ PO) Take 1 tablet by mouth.     No current facility-administered medications for this visit.      Past Medical History:  Diagnosis Date  . Aortic stenosis   . Arthritis    lower spine- 2012  . Cardiac murmur   . ED (erectile dysfunction)   . Esophageal reflux   . Hyperlipidemia   . Hypertension   . Hypothyroid   . Prostate cancer ([place]) 10/20/13  . Prostate cancer ([place])   . S/P radiation therapy 03/05/2014 through  04/19/2014   Prostate bed 6600 cGy in 33 sessions, pelvic lymph nodes 5000 445 cGy in 33 sessions   . Stress incontinence     Past Surgical History:  Procedure Laterality Date  . COLONOSCOPY    . LYMPHADENECTOMY Bilateral 10/20/2013   Procedure: BILATERAL PELVIC LYMPH NODE DISSECTION;  Surgeon: Alexis Frock, MD;  Location: [facility];  Service: Urology;  Laterality: Bilateral;  . ROBOT ASSISTED LAPAROSCOPIC RADICAL PROSTATECTOMY N/A 10/20/2013   Procedure: ROBOTIC ASSISTED LAPAROSCOPIC RADICAL PROSTATECTOMY WITH INDOCYANINE GREEN DYE;  Surgeon: Alexis Frock, MD;  Location: [facility];  Service: Urology;  Laterality: N/A;  . TONSILLECTOMY    . varicocele surgery  Right     Social History   Socioeconomic History  . Marital status: Divorced    Spouse name: Not on file  . Number of children: 2  . Years of education: Not on file  . Highest education level: Not on file  Occupational History  . Not on file  Social Needs  . Financial resource strain: Not on file  . Food insecurity:    Worry: Not on file    Inability: Not on file  . Transportation needs:    Medical: Not on file    Non-medical: Not on file  Tobacco Use  . Smoking status: Former Smoker    Packs/day: 1.00    Years: 20.00    Pack years: 20.00    Types: Cigarettes    Last attempt to quit: 02/15/1983    Years since quitting: 34.5  . Smokeless  tobacco: Never Used  Substance and Sexual Activity  . Alcohol use: Yes    Alcohol/week: 0.0 oz    Comment: rarely   . Drug use: No  . Sexual activity: Not on file  Lifestyle  . Physical activity:    Days per week: Not on file    Minutes per session: Not on file  . Stress: Not on file  Relationships  . Social connections:    Talks on phone: Not on file    Gets together: Not on file    Attends religious service: Not on file    Active member of club or organization: Not on file    Attends meetings of clubs or organizations: Not on file    Relationship  status: Not on file  . Intimate partner violence:    Fear of current or ex partner: Not on file    Emotionally abused: Not on file    Physically abused: Not on file    Forced sexual activity: Not on file  Other Topics Concern  . Not on file  Social History Narrative  . Not on file    Family History  Problem Relation Age of Onset  . Emphysema Father   . Prostate cancer Father   . Breast cancer Mother     ROS: no fevers or chills, productive cough, hemoptysis, dysphasia, odynophagia, melena, hematochezia, dysuria, hematuria, rash, seizure activity, orthopnea, PND, pedal edema, claudication. Remaining systems are negative.  Physical Exam: Well-developed well-nourished in no acute distress.  Skin is warm and dry.  HEENT is normal.  Neck is supple.  Chest is clear to auscultation with normal expansion.  Cardiovascular exam is regular rate and rhythm.  3/6 systolic murmur left sternal border. Abdominal exam nontender or distended. No masses palpated. Extremities show no edema. neuro grossly intact  ECG-sinus rhythm with occasional PAC.  Nonspecific ST changes.  Personally reviewed  A/P  1 aortic stenosis/aortic insufficiency-patient describes increasing dyspnea on exertion limiting his exercise capabilities.  This is worse compared to previous.  Echocardiogram shows both moderate aortic stenosis and moderate aortic insufficiency.  He would like to consider proceeding with aortic valve replacement.  We will arrange a right and left cardiac catheterization to define coronary anatomy and right heart pressures prior to procedure.  The risks and benefits including myocardial infarction, CVA and death discussed and he agrees to proceed.  Once cardiac catheterization complete we will refer to surgery.  2 thoracic aortic aneurysm-given that we are proceeding with consideration of aortic valve replacement will need to reassess aortic root.  Repeat CTA.  3 hypertension-blood pressure is  controlled.  Continue present medications.  4 hyperlipidemia-continue statin.  5 prostate cancer-managed by urology.  [name], MD

## 2017-08-09 NOTE — Progress Notes (Signed)
HPI: FU aortic stenosis. Stress echocardiogram in [place] May 2014 normal. Patient had prostatectomy previously. He has struggled with dyspnea on exertion since then. CTA September 2017 showed dilated aortic root at 4.2 x 4 cm, no abd aneurysm or dissection. Echocardiogram repeated August 2018 and showed normal LV systolic function, moderate aortic stenosis with mean gradient 22 mmHg, moderate aortic insufficiency, mildly dilated aortic root, mild left atrial enlargement and mild right ventricular enlargement. Chest CT repeated September 2018 and showed dilated descending thoracic aorta at 4 cm.   Echocardiogram repeated July 2019 and showed normal LV function, functionally bicuspid aortic valve with moderate aortic stenosis (mean gradient 30 mmHg), moderate aortic insufficiency, mild mitral regurgitation.  Since last seen,  patient does have dyspnea with more vigorous activities relieved with rest.  No orthopnea, PND, pedal edema, chest pain or syncope.  Some dizziness with exercise.  Current Outpatient Medications  Medication Sig Dispense Refill  . amLODipine (NORVASC) 2.5 MG tablet Take 2.5 mg by mouth daily.    [name] aspirin 81 MG tablet Take 81 mg by mouth daily.    . cetirizine (ZYRTEC) 10 MG tablet Take 10 mg by mouth daily.    [name] levothyroxine (SYNTHROID, LEVOTHROID) 75 MCG tablet Take 75 mcg by mouth daily before breakfast.    . lisinopril (PRINIVIL,ZESTRIL) 10 MG tablet Take 10 mg by mouth daily.    . Multiple Vitamins-Minerals (CENTRUM SILVER ADULT 50+ PO) Take 1 tablet by mouth.     No current facility-administered medications for this visit.      Past Medical History:  Diagnosis Date  . Aortic stenosis   . Arthritis    lower spine- 2012  . Cardiac murmur   . ED (erectile dysfunction)   . Esophageal reflux   . Hyperlipidemia   . Hypertension   . Hypothyroid   . Prostate cancer ([place]) 10/20/13  . Prostate cancer ([place])   . S/P radiation therapy 03/05/2014 through  04/19/2014   Prostate bed 6600 cGy in 33 sessions, pelvic lymph nodes 5000 445 cGy in 33 sessions   . Stress incontinence     Past Surgical History:  Procedure Laterality Date  . COLONOSCOPY    . LYMPHADENECTOMY Bilateral 10/20/2013   Procedure: BILATERAL PELVIC LYMPH NODE DISSECTION;  Surgeon: Alexis Frock, MD;  Location: [facility];  Service: Urology;  Laterality: Bilateral;  . ROBOT ASSISTED LAPAROSCOPIC RADICAL PROSTATECTOMY N/A 10/20/2013   Procedure: ROBOTIC ASSISTED LAPAROSCOPIC RADICAL PROSTATECTOMY WITH INDOCYANINE GREEN DYE;  Surgeon: Alexis Frock, MD;  Location: [facility];  Service: Urology;  Laterality: N/A;  . TONSILLECTOMY    . varicocele surgery  Right     Social History   Socioeconomic History  . Marital status: Divorced    Spouse name: Not on file  . Number of children: 2  . Years of education: Not on file  . Highest education level: Not on file  Occupational History  . Not on file  Social Needs  . Financial resource strain: Not on file  . Food insecurity:    Worry: Not on file    Inability: Not on file  . Transportation needs:    Medical: Not on file    Non-medical: Not on file  Tobacco Use  . Smoking status: Former Smoker    Packs/day: 1.00    Years: 20.00    Pack years: 20.00    Types: Cigarettes    Last attempt to quit: 02/15/1983    Years since quitting: 34.5  . Smokeless  tobacco: Never Used  Substance and Sexual Activity  . Alcohol use: Yes    Alcohol/week: 0.0 oz    Comment: rarely   . Drug use: No  . Sexual activity: Not on file  Lifestyle  . Physical activity:    Days per week: Not on file    Minutes per session: Not on file  . Stress: Not on file  Relationships  . Social connections:    Talks on phone: Not on file    Gets together: Not on file    Attends religious service: Not on file    Active member of club or organization: Not on file    Attends meetings of clubs or organizations: Not on file    Relationship  status: Not on file  . Intimate partner violence:    Fear of current or ex partner: Not on file    Emotionally abused: Not on file    Physically abused: Not on file    Forced sexual activity: Not on file  Other Topics Concern  . Not on file  Social History Narrative  . Not on file    Family History  Problem Relation Age of Onset  . Emphysema Father   . Prostate cancer Father   . Breast cancer Mother     ROS: no fevers or chills, productive cough, hemoptysis, dysphasia, odynophagia, melena, hematochezia, dysuria, hematuria, rash, seizure activity, orthopnea, PND, pedal edema, claudication. Remaining systems are negative.  Physical Exam: Well-developed well-nourished in no acute distress.  Skin is warm and dry.  HEENT is normal.  Neck is supple.  Chest is clear to auscultation with normal expansion.  Cardiovascular exam is regular rate and rhythm.  3/6 systolic murmur left sternal border. Abdominal exam nontender or distended. No masses palpated. Extremities show no edema. neuro grossly intact  ECG-sinus rhythm with occasional PAC.  Nonspecific ST changes.  Personally reviewed  A/P  1 aortic stenosis/aortic insufficiency-patient describes increasing dyspnea on exertion limiting his exercise capabilities.  This is worse compared to previous.  Echocardiogram shows both moderate aortic stenosis and moderate aortic insufficiency.  He would like to consider proceeding with aortic valve replacement.  We will arrange a right and left cardiac catheterization to define coronary anatomy and right heart pressures prior to procedure.  The risks and benefits including myocardial infarction, CVA and death discussed and he agrees to proceed.  Once cardiac catheterization complete we will refer to surgery.  2 thoracic aortic aneurysm-given that we are proceeding with consideration of aortic valve replacement will need to reassess aortic root.  Repeat CTA.  3 hypertension-blood pressure is  controlled.  Continue present medications.  4 hyperlipidemia-continue statin.  5 prostate cancer-managed by urology.  [name], MD

## 2017-08-16 ENCOUNTER — Ambulatory Visit (INDEPENDENT_AMBULATORY_CARE_PROVIDER_SITE_OTHER): Payer: BLUE CROSS/BLUE SHIELD | Admitting: Cardiology

## 2017-08-16 ENCOUNTER — Encounter: Payer: Self-pay | Admitting: Cardiology

## 2017-08-16 VITALS — BP 124/80 | HR 66 | Ht 71.0 in | Wt 202.2 lb

## 2017-08-16 DIAGNOSIS — I10 Essential (primary) hypertension: Secondary | ICD-10-CM | POA: Diagnosis not present

## 2017-08-16 DIAGNOSIS — I350 Nonrheumatic aortic (valve) stenosis: Secondary | ICD-10-CM | POA: Diagnosis not present

## 2017-08-16 DIAGNOSIS — E7800 Pure hypercholesterolemia, unspecified: Secondary | ICD-10-CM

## 2017-08-16 DIAGNOSIS — I712 Thoracic aortic aneurysm, without rupture, unspecified: Secondary | ICD-10-CM

## 2017-08-16 DIAGNOSIS — Z01812 Encounter for preprocedural laboratory examination: Secondary | ICD-10-CM | POA: Diagnosis not present

## 2017-08-16 LAB — BASIC METABOLIC PANEL
BUN/Creatinine Ratio: 15 (ref 10–24)
BUN: 19 mg/dL (ref 8–27)
CO2: 22 mmol/L (ref 20–29)
Calcium: 9.7 mg/dL (ref 8.6–10.2)
Chloride: 104 mmol/L (ref 96–106)
Creatinine, Ser: 1.26 mg/dL (ref 0.76–1.27)
GFR calc Af Amer: 68 mL/min/{1.73_m2} (ref >59)
GFR calc non Af Amer: 59 mL/min/{1.73_m2} — ABNORMAL LOW (ref >59)
Glucose: 93 mg/dL (ref 65–99)
POTASSIUM: 4.5 mmol/L (ref 3.5–5.2)
SODIUM: 143 mmol/L (ref 134–144)

## 2017-08-16 LAB — CBC
Hematocrit: 41.9 % (ref 37.5–51.0)
Hemoglobin: 14.4 g/dL (ref 13.0–17.7)
MCH: 31.9 pg (ref 26.6–33.0)
MCHC: 34.4 g/dL (ref 31.5–35.7)
MCV: 93 fL (ref 79–97)
PLATELETS: 229 10*3/uL (ref 150–450)
RBC: 4.52 x10E6/uL (ref 4.14–5.80)
RDW: 14.1 % (ref 12.3–15.4)
WBC: 5.2 10*3/uL (ref 3.4–10.8)

## 2017-08-16 NOTE — Patient Instructions (Signed)
Medication Instructions:  Your physician recommends that you continue on your current medications as directed. Please refer to the Current Medication list given to you today.  Labwork: Today (CBC, BMET)  Testing/Procedures: Your physician has requested that you have a cardiac catheterization. Cardiac catheterization is used to diagnose and/or treat various heart conditions. Doctors may recommend this procedure for a number of different reasons. The most common reason is to evaluate chest pain. Chest pain can be a symptom of coronary artery disease (CAD), and cardiac catheterization can show whether plaque is narrowing or blocking your heart's arteries. This procedure is also used to evaluate the valves, as well as measure the blood flow and oxygen levels in different parts of your heart. For further information please visit [email or web address]. Please follow instruction sheet, as given.  Non-Cardiac CT Angiography (CTA), is a special type of CT scan that uses a computer to produce multi-dimensional views of major blood vessels throughout the body. In CT angiography, a contrast material is injected through an IV to help visualize the blood vessels  Follow-Up: 6-8 weeks with Dr. Stanford Breed  Any Other Special Instructions Will Be Listed Below (If Applicable).     If you need a refill on your cardiac medications before your next appointment, please call your pharmacy.

## 2017-08-18 ENCOUNTER — Other Ambulatory Visit: Payer: Self-pay | Admitting: *Deleted

## 2017-08-18 DIAGNOSIS — I350 Nonrheumatic aortic (valve) stenosis: Secondary | ICD-10-CM

## 2017-08-20 ENCOUNTER — Telehealth: Payer: Self-pay

## 2017-08-20 NOTE — Telephone Encounter (Signed)
Patient contacted pre-catheterization at [facility] scheduled for:  08/23/2017 @ 1200 Verified arrival time and place:  Admitting at 10:00 am Confirmed AM meds to be taken pre-cath with sip of water: Take ASA Confirmed patient has responsible person to drive home post procedure and observe patient for 24 hours:  yes Addl concerns:  none

## 2017-08-23 ENCOUNTER — Ambulatory Visit (HOSPITAL_COMMUNITY)
Admission: RE | Admit: 2017-08-23 | Discharge: 2017-08-23 | Disposition: A | Discharge status: Home / Self Care | Payer: BLUE CROSS/BLUE SHIELD | Source: Ambulatory Visit | Attending: Cardiology | Admitting: Cardiology

## 2017-08-23 ENCOUNTER — Ambulatory Visit (HOSPITAL_COMMUNITY)
Admission: RE | Disposition: A | Discharge status: Home / Self Care | Payer: Self-pay | Source: Ambulatory Visit | Attending: Cardiology

## 2017-08-23 DIAGNOSIS — K219 Gastro-esophageal reflux disease without esophagitis: Secondary | ICD-10-CM | POA: Diagnosis not present

## 2017-08-23 DIAGNOSIS — I350 Nonrheumatic aortic (valve) stenosis: Secondary | ICD-10-CM

## 2017-08-23 DIAGNOSIS — N529 Male erectile dysfunction, unspecified: Secondary | ICD-10-CM | POA: Diagnosis not present

## 2017-08-23 DIAGNOSIS — I712 Thoracic aortic aneurysm, without rupture: Secondary | ICD-10-CM | POA: Insufficient documentation

## 2017-08-23 DIAGNOSIS — Z87891 Personal history of nicotine dependence: Secondary | ICD-10-CM | POA: Insufficient documentation

## 2017-08-23 DIAGNOSIS — Z9079 Acquired absence of other genital organ(s): Secondary | ICD-10-CM | POA: Insufficient documentation

## 2017-08-23 DIAGNOSIS — Z7982 Long term (current) use of aspirin: Secondary | ICD-10-CM | POA: Insufficient documentation

## 2017-08-23 DIAGNOSIS — I2510 Atherosclerotic heart disease of native coronary artery without angina pectoris: Secondary | ICD-10-CM | POA: Diagnosis not present

## 2017-08-23 DIAGNOSIS — I10 Essential (primary) hypertension: Secondary | ICD-10-CM | POA: Diagnosis not present

## 2017-08-23 DIAGNOSIS — E039 Hypothyroidism, unspecified: Secondary | ICD-10-CM | POA: Diagnosis not present

## 2017-08-23 DIAGNOSIS — I352 Nonrheumatic aortic (valve) stenosis with insufficiency: Secondary | ICD-10-CM | POA: Diagnosis present

## 2017-08-23 DIAGNOSIS — Z9889 Other specified postprocedural states: Secondary | ICD-10-CM | POA: Insufficient documentation

## 2017-08-23 DIAGNOSIS — C61 Malignant neoplasm of prostate: Secondary | ICD-10-CM | POA: Diagnosis not present

## 2017-08-23 DIAGNOSIS — Z79890 Hormone replacement therapy (postmenopausal): Secondary | ICD-10-CM | POA: Insufficient documentation

## 2017-08-23 DIAGNOSIS — Z803 Family history of malignant neoplasm of breast: Secondary | ICD-10-CM | POA: Diagnosis not present

## 2017-08-23 DIAGNOSIS — E785 Hyperlipidemia, unspecified: Secondary | ICD-10-CM | POA: Diagnosis not present

## 2017-08-23 DIAGNOSIS — Z79899 Other long term (current) drug therapy: Secondary | ICD-10-CM | POA: Diagnosis not present

## 2017-08-23 DIAGNOSIS — Z8042 Family history of malignant neoplasm of prostate: Secondary | ICD-10-CM | POA: Insufficient documentation

## 2017-08-23 HISTORY — PX: RIGHT/LEFT HEART CATH AND CORONARY ANGIOGRAPHY: CATH118266

## 2017-08-23 LAB — POCT I-STAT 3, ART BLOOD GAS (G3+)
Acid-base deficit: 1 mmol/L (ref 0.0–2.0)
Bicarbonate: 23.9 mmol/L (ref 20.0–28.0)
O2 SAT: 92 %
PCO2 ART: 39.4 mmHg (ref 32.0–48.0)
TCO2: 25 mmol/L (ref 22–32)
pH, Arterial: 7.391 (ref 7.350–7.450)
pO2, Arterial: 65 mmHg — ABNORMAL LOW (ref 83.0–108.0)

## 2017-08-23 LAB — POCT I-STAT 3, VENOUS BLOOD GAS (G3P V)
ACID-BASE DEFICIT: 1 mmol/L (ref 0.0–2.0)
Acid-base deficit: 1 mmol/L (ref 0.0–2.0)
BICARBONATE: 24.9 mmol/L (ref 20.0–28.0)
Bicarbonate: 24.5 mmol/L (ref 20.0–28.0)
Bicarbonate: 24.9 mmol/L (ref 20.0–28.0)
O2 SAT: 69 %
O2 SAT: 71 %
O2 Saturation: 67 %
PCO2 VEN: 41.5 mmHg — AB (ref 44.0–60.0)
PO2 VEN: 38 mmHg (ref 32.0–45.0)
TCO2: 26 mmol/L (ref 22–32)
TCO2: 26 mmol/L (ref 22–32)
TCO2: 26 mmol/L (ref 22–32)
pCO2, Ven: 42.1 mmHg — ABNORMAL LOW (ref 44.0–60.0)
pCO2, Ven: 42.8 mmHg — ABNORMAL LOW (ref 44.0–60.0)
pH, Ven: 7.372 (ref 7.250–7.430)
pH, Ven: 7.379 (ref 7.250–7.430)
pH, Ven: 7.38 (ref 7.250–7.430)
pO2, Ven: 35 mmHg (ref 32.0–45.0)
pO2, Ven: 37 mmHg (ref 32.0–45.0)

## 2017-08-23 SURGERY — RIGHT/LEFT HEART CATH AND CORONARY ANGIOGRAPHY
Anesthesia: LOCAL

## 2017-08-23 MED ORDER — HEPARIN (PORCINE) IN NACL 1000-0.9 UT/500ML-% IV SOLN
INTRAVENOUS | Status: DC | PRN
  Administered 2017-08-23: 500 mL

## 2017-08-23 MED ORDER — HYDRALAZINE HCL 20 MG/ML IJ SOLN
INTRAMUSCULAR | Status: AC
  Filled 2017-08-23: qty 1

## 2017-08-23 MED ORDER — LIDOCAINE HCL (PF) 1 % IJ SOLN
INTRAMUSCULAR | Status: AC
  Filled 2017-08-23: qty 30

## 2017-08-23 MED ORDER — IOPAMIDOL (ISOVUE-370) INJECTION 76%
INTRAVENOUS | Status: AC
  Filled 2017-08-23: qty 100

## 2017-08-23 MED ORDER — HEPARIN SODIUM (PORCINE) 1000 UNIT/ML IJ SOLN
INTRAMUSCULAR | Status: DC | PRN
  Administered 2017-08-23: 5000 [IU] via INTRAVENOUS

## 2017-08-23 MED ORDER — HEPARIN (PORCINE) IN NACL 1000-0.9 UT/500ML-% IV SOLN
INTRAVENOUS | Status: AC
  Filled 2017-08-23: qty 1000

## 2017-08-23 MED ORDER — SODIUM CHLORIDE 0.9 % WEIGHT BASED INFUSION: 1.0000 mL/kg/h | INTRAVENOUS | Status: DC

## 2017-08-23 MED ORDER — SODIUM CHLORIDE 0.9 % IV SOLN: 250.0000 mL | INTRAVENOUS | Status: DC | PRN

## 2017-08-23 MED ORDER — SODIUM CHLORIDE 0.9% FLUSH: 3.0000 mL | INTRAVENOUS | Status: DC | PRN

## 2017-08-23 MED ORDER — MIDAZOLAM HCL 2 MG/2ML IJ SOLN
INTRAMUSCULAR | Status: AC
  Filled 2017-08-23: qty 2

## 2017-08-23 MED ORDER — VERAPAMIL HCL 2.5 MG/ML IV SOLN
INTRAVENOUS | Status: AC
  Filled 2017-08-23: qty 2

## 2017-08-23 MED ORDER — LIDOCAINE HCL (PF) 1 % IJ SOLN
INTRAMUSCULAR | Status: DC | PRN
  Administered 2017-08-23 (×2): 2 mL

## 2017-08-23 MED ORDER — MIDAZOLAM HCL 2 MG/2ML IJ SOLN
INTRAMUSCULAR | Status: DC | PRN
  Administered 2017-08-23: 1 mg via INTRAVENOUS

## 2017-08-23 MED ORDER — FENTANYL CITRATE (PF) 100 MCG/2ML IJ SOLN
INTRAMUSCULAR | Status: AC
  Filled 2017-08-23: qty 2

## 2017-08-23 MED ORDER — FENTANYL CITRATE (PF) 100 MCG/2ML IJ SOLN
INTRAMUSCULAR | Status: DC | PRN
  Administered 2017-08-23: 25 ug via INTRAVENOUS

## 2017-08-23 MED ORDER — HEPARIN SODIUM (PORCINE) 1000 UNIT/ML IJ SOLN
INTRAMUSCULAR | Status: AC
  Filled 2017-08-23: qty 1

## 2017-08-23 MED ORDER — SODIUM CHLORIDE 0.9% FLUSH: 3.0000 mL | Freq: Two times a day (BID) | INTRAVENOUS | Status: DC

## 2017-08-23 MED ORDER — IOPAMIDOL (ISOVUE-370) INJECTION 76%
INTRAVENOUS | Status: DC | PRN
  Administered 2017-08-23: 75 mL via INTRA_ARTERIAL

## 2017-08-23 MED ORDER — ASPIRIN 81 MG PO CHEW: 81.0000 mg | CHEWABLE_TABLET | ORAL | Status: DC

## 2017-08-23 MED ORDER — SODIUM CHLORIDE 0.9 % WEIGHT BASED INFUSION
3.0000 mL/kg/h | INTRAVENOUS | Status: AC
  Administered 2017-08-23: 3 mL/kg/h via INTRAVENOUS

## 2017-08-23 SURGICAL SUPPLY — 14 items
CATH BALLN WEDGE 5F 110CM (CATHETERS) ×1 IMPLANT
CATH INFINITI JR4 5F (CATHETERS) ×1 IMPLANT
CATH OPTITORQUE TIG 4.0 5F (CATHETERS) ×1 IMPLANT
DEVICE RAD COMP TR BAND LRG (VASCULAR PRODUCTS) ×1 IMPLANT
GLIDESHEATH SLEND SS 6F .021 (SHEATH) ×1 IMPLANT
GUIDEWIRE INQWIRE 1.5J.035X260 (WIRE) IMPLANT
INQWIRE 1.5J .035X260CM (WIRE) ×2
KIT HEART LEFT (KITS) ×2 IMPLANT
PACK CARDIAC CATHETERIZATION (CUSTOM PROCEDURE TRAY) ×2 IMPLANT
SHEATH GLIDE SLENDER 4/5FR (SHEATH) ×1 IMPLANT
SYR MEDRAD MARK V 150ML (SYRINGE) ×2 IMPLANT
TRANSDUCER W/STOPCOCK (MISCELLANEOUS) ×2 IMPLANT
TUBING CIL FLEX 10 FLL-RA (TUBING) ×2 IMPLANT
WIRE EMERALD ST .035X150CM (WIRE) ×1 IMPLANT

## 2017-08-23 NOTE — Discharge Instructions (Signed)

## 2017-08-23 NOTE — Interval H&P Note (Signed)
History and Physical Interval Note:  08/23/2017 2:13 PM  Danny Odom  has presented today for surgery, with the diagnosis of aortic stenosis.   The various methods of treatment have been discussed with the patient and family. After consideration of risks, benefits and other options for treatment, the patient has consented to  Procedure(s): RIGHT/LEFT HEART CATH AND CORONARY ANGIOGRAPHY (N/A) as a surgical intervention .  The patient's history has been reviewed, patient examined, no change in status, stable for surgery.  I have reviewed the patient's chart and labs.  Questions were answered to the patient's satisfaction.     Glenetta Hew

## 2017-08-23 NOTE — Research (Signed)
CADFEM Informed Consent   Subject Name: Danny Odom  Subject met inclusion and exclusion criteria.  The informed consent form, study requirements and expectations were reviewed with the subject and questions and concerns were addressed prior to the signing of the consent form.  The subject verbalized understanding of the trail requirements.  The subject agreed to participate in the CADFEM trial and signed the informed consent.  The informed consent was obtained prior to performance of any protocol-specific procedures for the subject.  A copy of the signed informed consent was given to the subject and a copy was placed in the subject's medical record.  [name] 08/23/2017, 11:20 AM

## 2017-08-24 ENCOUNTER — Encounter (HOSPITAL_COMMUNITY): Payer: Self-pay | Admitting: Cardiology

## 2017-08-24 ENCOUNTER — Telehealth: Payer: Self-pay | Admitting: *Deleted

## 2017-08-24 DIAGNOSIS — I350 Nonrheumatic aortic (valve) stenosis: Secondary | ICD-10-CM

## 2017-08-24 MED FILL — Verapamil HCl IV Soln 2.5 MG/ML: INTRAVENOUS | Qty: 2 | Status: AC

## 2017-08-24 MED FILL — Hydralazine HCl Inj 20 MG/ML: INTRAMUSCULAR | Qty: 1 | Status: AC

## 2017-08-24 NOTE — Telephone Encounter (Addendum)
-----   Message from [name], MD sent at 08/23/2017  4:28 PM EDT ----- Please arrange CVTS consult with Dr Cyndia Bent and contact pt with appt [place] with pt, aware referral placed and he will contact me if he has heard from that office by the end of next week.

## 2017-08-30 ENCOUNTER — Ambulatory Visit (INDEPENDENT_AMBULATORY_CARE_PROVIDER_SITE_OTHER)
Admission: RE | Admit: 2017-08-30 | Discharge: 2017-08-30 | Disposition: A | Discharge status: Home / Self Care | Payer: BLUE CROSS/BLUE SHIELD | Source: Ambulatory Visit | Attending: Cardiology | Admitting: Cardiology

## 2017-08-30 DIAGNOSIS — I712 Thoracic aortic aneurysm, without rupture, unspecified: Secondary | ICD-10-CM

## 2017-08-30 MED ORDER — IOPAMIDOL (ISOVUE-370) INJECTION 76%
100.0000 mL | Freq: Once | INTRAVENOUS | Status: AC | PRN
  Administered 2017-08-30: 100 mL via INTRAVENOUS

## 2017-09-16 ENCOUNTER — Other Ambulatory Visit: Payer: Self-pay

## 2017-09-16 ENCOUNTER — Institutional Professional Consult (permissible substitution) (INDEPENDENT_AMBULATORY_CARE_PROVIDER_SITE_OTHER): Payer: BLUE CROSS/BLUE SHIELD | Admitting: Surgery

## 2017-09-16 ENCOUNTER — Encounter: Payer: Self-pay | Admitting: Surgery

## 2017-09-16 VITALS — BP 135/87 | HR 77 | Resp 16 | Ht 71.0 in | Wt 199.0 lb

## 2017-09-16 DIAGNOSIS — I350 Nonrheumatic aortic (valve) stenosis: Secondary | ICD-10-CM | POA: Diagnosis not present

## 2017-09-16 NOTE — Progress Notes (Signed)
Cardiothoracic Surgery Consultation  PCP is Rusty Aus, MD Referring Provider is [name], MD  Chief Complaint  Patient presents with  . Aortic Stenosis    ECHO 08/09/17, CATH 08/23/17, CTA CHEST 08/30/17    HPI:  The patient is a 67 year old gentleman with a history of hypertension, hyperlipidemia, hypothyroidism, prostate cancer status post robot-assisted laparoscopic radical prostatectomy in 10/2013 with a subsequent rise in his PSA and radiation therapy in 2016, and known aortic stenosis.  He says that he had a stress echocardiogram in [place] in May 2014 which was normal.  He was having some exertional shortness of breath and fatigue at that time and says he was told that he has some aortic stenosis but it was not enough to warrant surgery.  He says he is continued to have dyspnea on exertion since then which is recently become a little worse.  He feels like it is limiting his ability to be as active as he wants to be.  He can walk on level ground but on inclines or going up stairs he has to take a break.  He denies any chest pain or pressure.  He has occasional dizziness particularly when he gets up from a sitting or laying down position.  He has had some lower extremity edema.  A 2D echocardiogram on 09/22/2016 showed mild aortic stenosis with a mean gradient of 22 mmHg and a dimensionless index of 0.42.  There is moderate aortic insufficiency.  Left ventricular ejection fraction was normal at 55 to 60% with grade 1 diastolic dysfunction.  His most recent echocardiogram on 08/09/2017 showed a rise in his mean gradient to 30 mmHg with a peak gradient of 54 mmHg.  The dimensionless index was 0.42.  There was still a moderate aortic insufficiency.  Left ventricular ejection fraction remains normal at 60 to 65%.  The left ventricular internal dimensions remain within normal limits.  He underwent cardiac catheterization on 08/23/2017.  This showed severe two-vessel coronary disease.  There  is a 95% ostial stenosis of a first diagonal branch which is a moderate-sized vessel.  There is also a 75% mid to distal left circumflex stenosis before a large marginal branch which looked hazy.  The LAD had no significant stenosis.  The right coronary artery had no significant stenosis.  The mean aortic valve gradient was 29 mmHg with the LVEDP measured at 23 mmHg.  Right heart pressures were normal.  There is also question of an enlarged aorta by CTA of the chest in 2017 when it was measured at 4.2 x 4.0 cm.  A CT scan of the chest in September 2018 measured the aorta at 4 cm.  A repeat CTA of the chest on 08/30/2017 shows a maximum diameter at the sinus level at 3.9 cm and the ascending aorta measuring 3.8 to 3.9 cm in greatest diameter.  Radiology felt that the previous measurements of 4.0 and 4.2 cm were due to measurements that were not perpendicular to the long axis of the aorta.  The patient is here today with his daughter.  He is divorced.  She lives in [place] and he lives in [place] but comes to [place] for his medical care.  He still works.  His prostate cancer has been taken care of by Dr. Tresa Moore.  The patient reports that after his radiation therapy his PSA level went to 0 but then has increased again and he is undergone hormone therapy.  Past Medical History:  Diagnosis Date  .  Aortic stenosis   . Arthritis    lower spine- 2012  . Cardiac murmur   . ED (erectile dysfunction)   . Esophageal reflux   . Hyperlipidemia   . Hypertension   . Hypothyroid   . Prostate cancer ([facility]) 10/20/13  . Prostate cancer ([facility])   . S/P radiation therapy 03/05/2014 through 04/19/2014   Prostate bed 6600 cGy in 33 sessions, pelvic lymph nodes 5000 445 cGy in 33 sessions   . Stress incontinence     Past Surgical History:  Procedure Laterality Date  . COLONOSCOPY    . LYMPHADENECTOMY Bilateral 10/20/2013   Procedure: BILATERAL PELVIC LYMPH NODE DISSECTION;   Surgeon: Alexis Frock, MD;  Location: [facility];  Service: Urology;  Laterality: Bilateral;  . RIGHT/LEFT HEART CATH AND CORONARY ANGIOGRAPHY N/A 08/23/2017   Procedure: RIGHT/LEFT HEART CATH AND CORONARY ANGIOGRAPHY;  Surgeon: Leonie Man, MD;  Location: [facility];  Service: Cardiovascular;  Laterality: N/A;  . ROBOT ASSISTED LAPAROSCOPIC RADICAL PROSTATECTOMY N/A 10/20/2013   Procedure: ROBOTIC ASSISTED LAPAROSCOPIC RADICAL PROSTATECTOMY WITH INDOCYANINE GREEN DYE;  Surgeon: Alexis Frock, MD;  Location: [facility];  Service: Urology;  Laterality: N/A;  . TONSILLECTOMY    . varicocele surgery  Right     Family History  Problem Relation Age of Onset  . Emphysema Father   . Prostate cancer Father   . Breast cancer Mother     Social History Social History   Tobacco Use  . Smoking status: Former Smoker    Packs/day: 1.00    Years: 20.00    Pack years: 20.00    Types: Cigarettes    Last attempt to quit: 02/15/1983    Years since quitting: 34.6  . Smokeless tobacco: Never Used  Substance Use Topics  . Alcohol use: Yes    Alcohol/week: 0.0 standard drinks    Comment: rarely   . Drug use: No    Current Outpatient Medications  Medication Sig Dispense Refill  . amLODipine (NORVASC) 2.5 MG tablet Take 2.5 mg by mouth daily.    [name] aspirin 81 MG tablet Take 81 mg by mouth daily.    [name] levothyroxine (SYNTHROID, LEVOTHROID) 75 MCG tablet Take 75 mcg by mouth daily before breakfast.    . lisinopril (PRINIVIL,ZESTRIL) 10 MG tablet Take 10 mg by mouth daily.    . Multiple Vitamins-Minerals (CENTRUM SILVER ADULT 50+ PO) Take 1 tablet by mouth daily.     [name] zinc gluconate 50 MG tablet Take 50 mg by mouth daily.     No current facility-administered medications for this visit.     No Known Allergies  Review of Systems  Constitutional: Positive for activity change, fatigue and unexpected weight change.  HENT: Negative.  Negative for dental problem.        Saw his dentist 2 weeks ago   Eyes: Negative.   Respiratory: Positive for cough and shortness of breath.   Cardiovascular: Positive for leg swelling. Negative for chest pain.  Gastrointestinal: Negative.   Endocrine: Negative.   Genitourinary: Negative.   Musculoskeletal: Negative.   Allergic/Immunologic: Negative.   Neurological: Positive for dizziness. Negative for syncope and headaches.  Hematological: Negative.   Psychiatric/Behavioral: Negative.     BP 135/87 (BP Location: Right Arm, Patient Position: Sitting, Cuff Size: Large)   Pulse 77   Resp 16   Ht 5\' 11"  (1.803 m)   Wt 199 lb (90.3 kg)   SpO2 93% Comment: ON RA  BMI 27.75 kg/m  Physical Exam  Constitutional: He is oriented to person, place, and time. He appears well-developed and well-nourished. No distress.  HENT:  Head: Normocephalic and atraumatic.  Mouth/Throat: Oropharynx is clear and moist.  Eyes: Pupils are equal, round, and reactive to light. EOM are normal.  Neck: Normal range of motion. Neck supple. No JVD present. No thyromegaly present.  Cardiovascular: Normal rate, regular rhythm and intact distal pulses.  Murmur heard. 3/6 systolic murmur RSB 2/6 diastolic murmur LLSB  Pulmonary/Chest: Effort normal.  Crackles in lung bases  Abdominal: Soft. Bowel sounds are normal. He exhibits no distension. There is no tenderness.  Musculoskeletal: Normal range of motion. He exhibits no edema.  Lymphadenopathy:    He has no cervical adenopathy.  Neurological: He is alert and oriented to person, place, and time. He has normal strength. No cranial nerve deficit or sensory deficit.  Skin: Skin is warm and dry.  Psychiatric: He has a normal mood and affect.     Diagnostic Tests:  Result status: Final result                           [facility]                        [address], [place] [address]                             [phone]  ------------------------------------------------------------------- Transthoracic Echocardiography  Patient:    Danny Odom, Danny Odom MR #:       [id number] Study Date: 08/09/2017 Gender:     M Age:        62 Height:     180.3 cm Weight:     88.5 kg BSA:        2.12 m^2 Pt. Status: Room:   ATTENDING    [name]  ORDERING     Aaron Edelman Crenshaw  REFERRING    [name]  SONOGRAPHER  Wyatt Mage, RDCS  PERFORMING   [facility], Outpatient  cc:  ------------------------------------------------------------------- LV EF: 60% -   65%  ------------------------------------------------------------------- Indications:      Aortic stenosis (I35).  ------------------------------------------------------------------- History:   PMH:   Murmur.  Risk factors:  Prostate cancer. Former tobacco use. Hypertension.  ------------------------------------------------------------------- Study Conclusions  - Procedure narrative: Transthoracic echocardiography. Image   quality was adequate. Intravenous contrast (Definity) was   administered to enhance Doppler signals. - Left ventricle: The cavity size was normal. Wall thickness was   normal. Systolic function was normal. The estimated ejection   fraction was in the range of 60% to 65%. - Aortic valve: Functionally bicuspid. There was moderate stenosis.   There was moderate regurgitation. Mean gradient (S): 30 mm Hg.   Peak gradient (S): 54 mm Hg. - Mitral valve: Calcified annulus. Mildly thickened leaflets .   There was mild regurgitation. - Atrial septum: No defect or patent foramen ovale was identified. - Pulmonary arteries: PA peak pressure: 36 mm Hg (S).  ------------------------------------------------------------------- Labs, prior tests, procedures, and surgery: Transthoracic echocardiography (09/22/2016).    The aortic valve showed mild stenosis. The aortic valve showed moderate regurgitation.  EF was 55% and PA  pressure was 41 (systolic). Aortic valve: peak gradient of 35 mm Hg and mean gradient of 22  mm Hg.  ------------------------------------------------------------------- Study data:  Comparison was made to the study of 09/22/2016.  Study status:  Routine.  Procedure:  The patient reported no pain pre or post test. Transthoracic echocardiography. Image quality was adequate. Intravenous contrast (Definity) was administered to enhance Doppler signals.  Study completion:  There were no complications.          Transthoracic echocardiography.  M-mode, complete 2D, spectral Doppler, and color Doppler.  Birthdate: Patient birthdate: 03-06-1950.  Age:  Patient is 67 yr old.  Sex: Gender: male.    BMI: 27.2 kg/m^2.  Blood pressure:     128/62 Patient status:  Outpatient.  Study date:  Study date: 08/09/2017. Study time: 07:17 AM.  Location:  [facility]  -------------------------------------------------------------------  ------------------------------------------------------------------- Left ventricle:  The cavity size was normal. Wall thickness was normal. Systolic function was normal. The estimated ejection fraction was in the range of 60% to 65%.  ------------------------------------------------------------------- Aortic valve:  Functionally bicuspid.  Severely thickened, severely calcified leaflets.  Doppler:   There was moderate stenosis. There was moderate regurgitation.    VTI ratio of LVOT to aortic valve: 0.45. Valve area (VTI): 1.4 cm^2. Indexed valve area (VTI): 0.66 cm^2/m^2. Peak velocity ratio of LVOT to aortic valve: 0.42. Valve area (Vmax): 1.31 cm^2. Indexed valve area (Vmax): 0.62 cm^2/m^2. Mean velocity ratio of LVOT to aortic valve: 0.45. Valve area (Vmean): 1.41 cm^2. Indexed valve area (Vmean): 0.66 cm^2/m^2.    Mean gradient (S): 30 mm Hg. Peak gradient (S): 54 mm Hg.  ------------------------------------------------------------------- Aorta:  The aorta was  normal, not dilated, and non-diseased.  ------------------------------------------------------------------- Mitral valve:   Calcified annulus. Mildly thickened leaflets . Doppler:  There was mild regurgitation.    Valve area by pressure half-time: 3.55 cm^2. Indexed valve area by pressure half-time: 1.68 cm^2/m^2.    Peak gradient (D): 2 mm Hg.  ------------------------------------------------------------------- Left atrium:  The atrium was normal in size.  ------------------------------------------------------------------- Atrial septum:  No defect or patent foramen ovale was identified.   ------------------------------------------------------------------- Right ventricle:  The cavity size was normal. Wall thickness was normal. Systolic function was normal.  ------------------------------------------------------------------- Pulmonic valve:    Doppler:  There was mild regurgitation.  ------------------------------------------------------------------- Tricuspid valve:   Doppler:  There was mild regurgitation.  ------------------------------------------------------------------- Right atrium:  The atrium was normal in size.  ------------------------------------------------------------------- Pericardium:  The pericardium was normal in appearance.  ------------------------------------------------------------------- Systemic veins: Inferior vena cava: The vessel was normal in size. The respirophasic diameter changes were in the normal range (>= 50%), consistent with normal central venous pressure.  ------------------------------------------------------------------- Post procedure conclusions Ascending Aorta:  - The aorta was normal, not dilated, and non-diseased.  ------------------------------------------------------------------- Measurements   Left ventricle                            Value          Reference  LV ID, ED, PLAX chordal                   46    mm        43 - 52  LV ID, ES, PLAX chordal                   32    mm       23 - 38  LV fx shortening, PLAX chordal            30    %        >=  29  LV PW thickness, ED                       9     mm       ---------  IVS/LV PW ratio, ED                       1.11           <=1.3  Stroke volume, 2D                         104   ml       ---------  Stroke volume/bsa, 2D                     49    ml/m^2   ---------  LV e&', lateral                            9.16  cm/s     ---------  LV E/e&', lateral                          8.26           ---------  LV e&', medial                             6.53  cm/s     ---------  LV E/e&', medial                           11.59          ---------  LV e&', average                            7.85  cm/s     ---------  LV E/e&', average                          9.65           ---------    Ventricular septum                        Value          Reference  IVS thickness, ED                         10    mm       ---------    LVOT                                      Value          Reference  LVOT ID, S                                20    mm       ---------  LVOT area  3.14  cm^2     ---------  LVOT peak velocity, S                     153   cm/s     ---------  LVOT mean velocity, S                     112   cm/s     ---------  LVOT VTI, S                               33.1  cm       ---------  LVOT peak gradient, S                     9     mm Hg    ---------    Aortic valve                              Value          Reference  Aortic valve peak velocity, S             366   cm/s     ---------  Aortic valve mean velocity, S             250   cm/s     ---------  Aortic valve VTI, S                       74.1  cm       ---------  Aortic mean gradient, S                   30    mm Hg    ---------  Aortic peak gradient, S                   54    mm Hg    ---------  VTI ratio, LVOT/AV                        0.45            ---------  Aortic valve area, VTI                    1.4   cm^2     ---------  Aortic valve area/bsa, VTI                0.66  cm^2/m^2 ---------  Velocity ratio, peak, LVOT/AV             0.42           ---------  Aortic valve area, peak velocity          1.31  cm^2     ---------  Aortic valve area/bsa, peak               0.62  cm^2/m^2 ---------  velocity  Velocity ratio, mean, LVOT/AV             0.45           ---------  Aortic valve area, mean velocity          1.41  cm^2     ---------  Aortic valve area/bsa, mean  0.66  cm^2/m^2 ---------  velocity  Aortic regurg pressure half-time          763   ms       ---------    Aorta                                     Value          Reference  Aortic root ID, ED                        39    mm       ---------  Ascending aorta ID, A-P, S                40    mm       ---------    Left atrium                               Value          Reference  LA ID, A-P, ES                            33    mm       ---------  LA ID/bsa, A-P                            1.56  cm/m^2   <=2.2  LA volume, S                              57.4  ml       ---------  LA volume/bsa, S                          27.1  ml/m^2   ---------  LA volume, ES, 1-p A4C                    55    ml       ---------  LA volume/bsa, ES, 1-p A4C                26    ml/m^2   ---------  LA volume, ES, 1-p A2C                    58.5  ml       ---------  LA volume/bsa, ES, 1-p A2C                27.6  ml/m^2   ---------    Mitral valve                              Value          Reference  Mitral E-wave peak velocity               75.7  cm/s     ---------  Mitral A-wave peak velocity               59.7  cm/s     ---------  Mitral deceleration time  211   ms       150 - 230  Mitral pressure half-time                 62    ms       ---------  Mitral peak gradient, D                   2     mm Hg    ---------  Mitral E/A ratio, peak                    1.3             ---------  Mitral valve area, PHT, DP                3.55  cm^2     ---------  Mitral valve area/bsa, PHT, DP            1.68  cm^2/m^2 ---------    Pulmonary arteries                        Value          Reference  PA pressure, S, DP                (H)     36    mm Hg    <=30    Tricuspid valve                           Value          Reference  Tricuspid regurg peak velocity            266   cm/s     ---------  Tricuspid peak RV-RA gradient             28    mm Hg    ---------    Systemic veins                            Value          Reference  Estimated CVP                             8     mm Hg    ---------    Right ventricle                           Value          Reference  TAPSE                                     17.5  mm       ---------  RV pressure, S, DP                (H)     36    mm Hg    <=30  RV s&', lateral, S                         9.57  cm/s     ---------  Legend: (L)  and  (H)  mark values outside specified reference range.  ------------------------------------------------------------------- Prepared and  Electronically Authenticated by  Jenkins Rouge, M.D. 2019-07-01T09:32:14     The left ventricular systolic function is normal. The left ventricular ejection fraction is greater than 65% by visual estimate.  LV end diastolic pressure is moderately elevated~23 mmHg  Moderate Aortic Stenosis - mean gradient 29.4 mmHg with ~Moderate Aortic Stenosis (~3+)  LESION #1: Ost 1st Diag lesion is 95% stenosed with 90% stenosed side branch in Lat 1st Diag.  Mid Cx lesion is 45% stenosed - upstream of Lesion #2.  LESION #2: Mid Cx to Dist Cx lesion is 75% stenosed.  Prox LAD to Mid LAD lesion is 10% stenosed.   Severe two-vessel disease involving branch point of first diagonal and mid circumflex-OM. By gradient, mean aortic stenosis of 29 mmHg, however likely underestimated in the setting of aortic insufficiency. Normal LV function with severely  elevated LVEDP Normal Right Heart Cath Pressures with no sign of Pulmonary Hypertension.  Plan: Return to short stay for TR removal.  Will need referral to CVTS after CT scan and consider TEE.  Will forward note to Dr. Stanford Breed   Recommend Aspirin 81mg  daily for moderate CAD.   Glenetta Hew, M.D., M.S. Interventional Cardiologist   Pager # [phone] Phone # [phone] [address] [place], [place] [address]  CLINICAL DATA:  Moderate aortic valvular stenosis and regurgitation by echocardiography. Previous CTA has demonstrated top-normal/mildly dilated ascending thoracic aorta.  EXAM: CT ANGIOGRAPHY CHEST WITH CONTRAST  TECHNIQUE: Multidetector CT imaging of the chest was performed using the standard protocol during bolus administration of intravenous contrast. Multiplanar CT image reconstructions and MIPs were obtained to evaluate the vascular anatomy.  CONTRAST:  165mL ISOVUE-370 IOPAMIDOL (ISOVUE-370) INJECTION 76%  COMPARISON:  10/27/2016 and 10/11/2015  FINDINGS: Cardiovascular: The thoracic aorta is well opacified. There again is noted to be a heavily calcified aortic valve. The aortic root measures 3.9 cm at the sinuses of Valsalva. The ascending thoracic aorta measures 3.8-3.9 cm in greatest diameter and is not overtly aneurysmal. Previous measurements of 4.0 and 4.2 cm maximal diameter do not appear to be perpendicular to the long axis of the aorta and are likely over estimates in diameter. The proximal arch measures 3.4 cm and the distal arch 3.0 cm. The descending thoracic aorta measures 2.8 cm. No evidence of aortic dissection. Proximal great vessels show normal patency and branching anatomy. Stable minimal plaque at the level of the aortic arch.  The heart size is stable and within normal limits. Stable mild calcified coronary artery plaque in a 3 vessel distribution. No pericardial fluid identified. Central pulmonary  arteries are normal in caliber.  Mediastinum/Nodes: No enlarged mediastinal, hilar, or axillary lymph nodes. Thyroid gland, trachea, and esophagus demonstrate no significant findings.  Lungs/Pleura: Stable pulmonary scarring predominantly in the lower lung zones bilaterally with some stable nodularity in the inferior right middle lobe. There is no evidence of pulmonary edema, consolidation, pneumothorax or pleural fluid.  Upper Abdomen: No acute abnormality.  Musculoskeletal: No chest wall abnormality. No acute or significant osseous findings.  Review of the MIP images confirms the above findings.  IMPRESSION: 1. The thoracic aorta is not felt to be overtly aneurysmal. Maximal diameter measures 3.8-3.9 cm on the current study when obtained perpendicular to the long axis of the aorta. Previous measurements of 4.0 and 4.2 cm are likely over estimates. Annual CTA follow-up likely is not necessary and the aortic valvular disease can likely be monitored by echocardiography alone. 2. Evidence of coronary atherosclerosis with mild calcified plaque in a 3  vessel distribution.   Electronically Signed   By: Aletta Edouard M.D.   On: 08/30/2017 10:25   Impression:  The 17-year-old gentleman has moderate aortic stenosis and moderate aortic insufficiency with severe two-vessel coronary disease with progressive symptoms of exertional fatigue and shortness of breath which are limiting his lifestyle.  I have personally reviewed his echocardiogram, cardiac catheterization, and CTA studies.  His echocardiogram shows a functionally bicuspid aortic valve with severely calcified leaflets and restricted mobility with a mean gradient of 30 mmHg consistent with moderate aortic stenosis as well as moderate aortic insufficiency.  His left ventricular systolic function is normal with normal internal dimensions.  Cardiac catheterization showed severe two-vessel coronary disease as noted above with  high-grade stenoses in the diagonal branch and a large obtuse marginal.  His LVEDP was elevated at 23 mmHg consistent with fluid overload.  I suspect that his symptoms are probably related to a combination of his moderate aortic stenosis and insufficiency as well as coronary disease.  He feels like this is significantly limiting his ability to remain active and would like to have it repaired.  I have recommended a bioprosthetic valve given his age of 55.  Given his symptoms I think it is reasonable to proceed with aortic valve replacement and coronary bypass graft surgery. I discussed the operative procedure with the patient and his daughter including alternatives, benefits and risks; including but not limited to bleeding, blood transfusion, infection, stroke, myocardial infarction, graft failure, heart block requiring a permanent pacemaker, organ dysfunction, and death.  [name] understands and agrees to proceed.     Plan:  He is going to discuss the timing with his daughter and will call us to schedule aortic valve replacement using a bioprosthetic valve and coronary artery bypass graft surgery.  I spent 60 minutes performing this consultation and > 50% of this time was spent face to face counseling and coordinating the care of this patient's moderate aortic stenosis and insufficiency and two-vessel coronary disease.   Gaye Pollack, MD [facility] [phone]

## 2017-09-17 ENCOUNTER — Encounter: Payer: Self-pay | Admitting: *Deleted

## 2017-09-17 ENCOUNTER — Other Ambulatory Visit: Payer: Self-pay | Admitting: *Deleted

## 2017-09-17 DIAGNOSIS — I2510 Atherosclerotic heart disease of native coronary artery without angina pectoris: Secondary | ICD-10-CM

## 2017-09-28 NOTE — Pre-Procedure Instructions (Addendum)
Danny Odom  09/28/2017      [place], [place] [address] [place] [address] Phone: [phone] Fax: [phone]    Your procedure is scheduled on August 23  Report to [place] at [email or web address].M.  Call this number if you have problems the morning of surgery:  [phone]   Remember:  Do not eat or drink after midnight.     Take these medicines the morning of surgery with A SIP OF WATER  amLODipine (NORVASC) levothyroxine (SYNTHROID, LEVOTHROID)   7 days prior to surgery STOP taking any Aspirin(unless otherwise instructed by your surgeon), Aleve, Naproxen, Ibuprofen, Motrin, Advil, Goody's, BC's, all herbal medications, fish oil, and all vitamins  Follow your surgeon's instructions on when to stop Asprin.  If no instructions were given by your surgeon then you will need to call the office to get those instructions.       Do not wear jewelry  Do not wear lotions, powders, or cologne, or deodorant.  Men may shave face and neck.  Do not bring valuables to the hospital.  [facility] is not responsible for any belongings or valuables.  Contacts, dentures or bridgework may not be worn into surgery.  Leave your suitcase in the car.  After surgery it may be brought to your room.  For patients admitted to the hospital, discharge time will be determined by your treatment team.  Patients discharged the day of surgery will not be allowed to drive home.    Special instructions:   [place]- Preparing For Surgery  Before surgery, you can play an important role. Because skin is not sterile, your skin needs to be as free of germs as possible. You can reduce the number of germs on your skin by washing with CHG (chlorahexidine gluconate) Soap before surgery.  CHG is an antiseptic cleaner which kills germs and bonds with the skin to continue killing germs even after washing.    Oral Hygiene is also important to reduce  your risk of infection.  Remember - BRUSH YOUR TEETH THE MORNING OF SURGERY WITH YOUR REGULAR TOOTHPASTE  Please do not use if you have an allergy to CHG or antibacterial soaps. If your skin becomes reddened/irritated stop using the CHG.  Do not shave (including legs and underarms) for at least 48 hours prior to first CHG shower. It is OK to shave your face.  Please follow these instructions carefully.   1. Shower the NIGHT BEFORE SURGERY and the MORNING OF SURGERY with CHG.   2. If you chose to wash your hair, wash your hair first as usual with your normal shampoo.  3. After you shampoo, rinse your hair and body thoroughly to remove the shampoo.  4. Use CHG as you would any other liquid soap. You can apply CHG directly to the skin and wash gently with a scrungie or a clean washcloth.   5. Apply the CHG Soap to your body ONLY FROM THE NECK DOWN.  Do not use on open wounds or open sores. Avoid contact with your eyes, ears, mouth and genitals (private parts). Wash Face and genitals (private parts)  with your normal soap.  6. Wash thoroughly, paying special attention to the area where your surgery will be performed.  7. Thoroughly rinse your body with warm water from the neck down.  8. DO NOT shower/wash with your normal soap after using and rinsing off the CHG Soap.  9. Pat yourself dry with a CLEAN TOWEL.  10. Wear CLEAN PAJAMAS to bed the night before surgery, wear comfortable clothes the morning of surgery  11. Place CLEAN SHEETS on your bed the night of your first shower and DO NOT SLEEP WITH PETS.    Day of Surgery:  Do not apply any deodorants/lotions.  Please wear clean clothes to the hospital/surgery center.   Remember to brush your teeth WITH YOUR REGULAR TOOTHPASTE.    Please read over the following fact sheets that you were given.

## 2017-09-29 ENCOUNTER — Ambulatory Visit (HOSPITAL_BASED_OUTPATIENT_CLINIC_OR_DEPARTMENT_OTHER)
Admission: RE | Admit: 2017-09-29 | Discharge: 2017-09-29 | Disposition: A | Discharge status: Home / Self Care | Payer: BLUE CROSS/BLUE SHIELD | Source: Ambulatory Visit | Attending: Surgery | Admitting: Surgery

## 2017-09-29 ENCOUNTER — Encounter (HOSPITAL_COMMUNITY): Payer: Self-pay

## 2017-09-29 ENCOUNTER — Encounter (HOSPITAL_COMMUNITY)
Admission: RE | Admit: 2017-09-29 | Discharge: 2017-09-29 | Disposition: A | Discharge status: Home / Self Care | Payer: BLUE CROSS/BLUE SHIELD | Source: Ambulatory Visit | Attending: Surgery | Admitting: Surgery

## 2017-09-29 ENCOUNTER — Ambulatory Visit (HOSPITAL_COMMUNITY)
Admission: RE | Admit: 2017-09-29 | Discharge: 2017-09-29 | Disposition: A | Discharge status: Home / Self Care | Payer: BLUE CROSS/BLUE SHIELD | Source: Ambulatory Visit | Attending: Surgery | Admitting: Surgery

## 2017-09-29 ENCOUNTER — Other Ambulatory Visit: Payer: Self-pay

## 2017-09-29 DIAGNOSIS — Z01818 Encounter for other preprocedural examination: Secondary | ICD-10-CM | POA: Insufficient documentation

## 2017-09-29 DIAGNOSIS — J449 Chronic obstructive pulmonary disease, unspecified: Secondary | ICD-10-CM | POA: Diagnosis not present

## 2017-09-29 DIAGNOSIS — I2510 Atherosclerotic heart disease of native coronary artery without angina pectoris: Secondary | ICD-10-CM | POA: Diagnosis not present

## 2017-09-29 DIAGNOSIS — G5623 Lesion of ulnar nerve, bilateral upper limbs: Secondary | ICD-10-CM | POA: Diagnosis not present

## 2017-09-29 DIAGNOSIS — R9431 Abnormal electrocardiogram [ECG] [EKG]: Secondary | ICD-10-CM | POA: Diagnosis not present

## 2017-09-29 DIAGNOSIS — Z01810 Encounter for preprocedural cardiovascular examination: Secondary | ICD-10-CM | POA: Diagnosis present

## 2017-09-29 DIAGNOSIS — R918 Other nonspecific abnormal finding of lung field: Secondary | ICD-10-CM | POA: Diagnosis not present

## 2017-09-29 DIAGNOSIS — Z736 Limitation of activities due to disability: Secondary | ICD-10-CM

## 2017-09-29 DIAGNOSIS — Z87891 Personal history of nicotine dependence: Secondary | ICD-10-CM | POA: Insufficient documentation

## 2017-09-29 LAB — PULMONARY FUNCTION TEST
DL/VA % PRED: 62 %
DL/VA: 2.93 ml/min/mmHg/L
DLCO UNC % PRED: 56 %
DLCO UNC: 19.01 ml/min/mmHg
FEF 25-75 POST: 0.76 L/s
FEF 25-75 Pre: 0.9 L/sec
FEF2575-%Change-Post: -15 %
FEF2575-%PRED-POST: 28 %
FEF2575-%Pred-Pre: 34 %
FEV1-%CHANGE-POST: 0 %
FEV1-%PRED-PRE: 86 %
FEV1-%Pred-Post: 86 %
FEV1-Post: 2.97 L
FEV1-Pre: 2.98 L
FEV1FVC-%Change-Post: 3 %
FEV1FVC-%PRED-PRE: 80 %
FEV6-%Change-Post: -1 %
FEV6-%PRED-POST: 93 %
FEV6-%Pred-Pre: 95 %
FEV6-Post: 4.15 L
FEV6-Pre: 4.23 L
FEV6FVC-%CHANGE-POST: 1 %
FEV6FVC-%Pred-Post: 90 %
FEV6FVC-%Pred-Pre: 89 %
FVC-%Change-Post: -3 %
FVC-%PRED-POST: 103 %
FVC-%PRED-PRE: 106 %
FVC-PRE: 4.99 L
FVC-Post: 4.82 L
POST FEV1/FVC RATIO: 62 %
PRE FEV1/FVC RATIO: 60 %
PRE FEV6/FVC RATIO: 85 %
Post FEV6/FVC ratio: 86 %
RV % pred: 103 %
RV: 2.55 L
TLC % PRED: 101 %
TLC: 7.36 L

## 2017-09-29 LAB — CBC
HEMATOCRIT: 44.3 % (ref 39.0–52.0)
HEMOGLOBIN: 13.9 g/dL (ref 13.0–17.0)
MCH: 31.2 pg (ref 26.0–34.0)
MCHC: 31.4 g/dL (ref 30.0–36.0)
MCV: 99.6 fL (ref 78.0–100.0)
Platelets: 212 10*3/uL (ref 150–400)
RBC: 4.45 MIL/uL (ref 4.22–5.81)
RDW: 13.4 % (ref 11.5–15.5)
WBC: 6.1 10*3/uL (ref 4.0–10.5)

## 2017-09-29 LAB — BLOOD GAS, ARTERIAL
Acid-Base Excess: 0.9 mmol/L (ref 0.0–2.0)
Bicarbonate: 24.4 mmol/L (ref 20.0–28.0)
DRAWN BY: 449841
FIO2: 21
O2 Saturation: 93 %
PATIENT TEMPERATURE: 98.6
PH ART: 7.454 — AB (ref 7.350–7.450)
pCO2 arterial: 35.3 mmHg (ref 32.0–48.0)
pO2, Arterial: 64.2 mmHg — ABNORMAL LOW (ref 83.0–108.0)

## 2017-09-29 LAB — COMPREHENSIVE METABOLIC PANEL
ALBUMIN: 3.7 g/dL (ref 3.5–5.0)
ALK PHOS: 81 U/L (ref 38–126)
ALT: 19 U/L (ref 0–44)
ANION GAP: 10 (ref 5–15)
AST: 17 U/L (ref 15–41)
BILIRUBIN TOTAL: 0.6 mg/dL (ref 0.3–1.2)
BUN: 20 mg/dL (ref 8–23)
CALCIUM: 9.1 mg/dL (ref 8.9–10.3)
CO2: 21 mmol/L — AB (ref 22–32)
Chloride: 110 mmol/L (ref 98–111)
Creatinine, Ser: 1.32 mg/dL — ABNORMAL HIGH (ref 0.61–1.24)
GFR calc Af Amer: >60 mL/min (ref >60)
GFR calc non Af Amer: 54 mL/min — ABNORMAL LOW (ref >60)
GLUCOSE: 120 mg/dL — AB (ref 70–99)
Potassium: 3.7 mmol/L (ref 3.5–5.1)
SODIUM: 141 mmol/L (ref 135–145)
TOTAL PROTEIN: 6.6 g/dL (ref 6.5–8.1)

## 2017-09-29 LAB — HEMOGLOBIN A1C
Hgb A1c MFr Bld: 5.5 % (ref 4.8–5.6)
Mean Plasma Glucose: 111.15 mg/dL

## 2017-09-29 LAB — TYPE AND SCREEN
ABO/RH(D): O POS
ANTIBODY SCREEN: NEGATIVE

## 2017-09-29 LAB — URINALYSIS, ROUTINE W REFLEX MICROSCOPIC
Bilirubin Urine: NEGATIVE
GLUCOSE, UA: NEGATIVE mg/dL
Hgb urine dipstick: NEGATIVE
KETONES UR: NEGATIVE mg/dL
Leukocytes, UA: NEGATIVE
NITRITE: NEGATIVE
PH: 6 (ref 5.0–8.0)
PROTEIN: NEGATIVE mg/dL
Specific Gravity, Urine: 1.017 (ref 1.005–1.030)

## 2017-09-29 LAB — PROTIME-INR
INR: 0.96
PROTHROMBIN TIME: 12.7 s (ref 11.4–15.2)

## 2017-09-29 LAB — APTT: APTT: 29 s (ref 24–36)

## 2017-09-29 LAB — SURGICAL PCR SCREEN
MRSA, PCR: NEGATIVE
Staphylococcus aureus: POSITIVE — AB

## 2017-09-29 LAB — ABO/RH: ABO/RH(D): O POS

## 2017-09-29 MED ORDER — ALBUTEROL SULFATE (2.5 MG/3ML) 0.083% IN NEBU
2.5000 mg | INHALATION_SOLUTION | Freq: Once | RESPIRATORY_TRACT | Status: AC
  Administered 2017-09-29: 2.5 mg via RESPIRATORY_TRACT

## 2017-09-29 NOTE — Progress Notes (Signed)
Prescription called in at [facility]

## 2017-09-29 NOTE — Progress Notes (Signed)
Pre-op Cardiac Surgery evaluation completed.   Carotid Findings:  Right ICA velocity maybe under estimated due to proximal CCA diffused homogenous plaque; Left ICA proximal focal heterogenous to homogenous plaque seen with  1-39% stenosis based on the category of velocity.  Bilateral vertebral arteries patent with antegrade flow.   Upper Extremity Right Left  Brachial Pressures 147 153  Radial Waveforms Triphasic Triphasic  Ulnar Waveforms Triphasic Biphasic  Palmar Arch (Ryker's Test) Within normal limits with  both radial and ulnar compression Doppler waveforms decrease 50% with left radial compression. Doppler waveforms remain within normal limits with left ulnar compression.      Lower  Extremity Right Left  Dorsalis Pedis Triphasic Triphasic  Posterior Tibial Triphasic Triphasic  Ankle/Brachial Indices 1.08 1.12    Findings:  Resting bilateral ankle-brachial index are within normal range. No evidence of significant bilateral lower extremity arterial disease.  Danny Odom (RDMS RVT) 09/29/17 11:39 AM

## 2017-09-29 NOTE — Progress Notes (Signed)
PCP - Emily Filbert Cardiologist - [place]  Chest x-ray - 09/29/17 EKG - 09/29/17 Stress Test - 2019 ECHO - 2014 Cardiac Cath-2019    Aspirin Instructions: continue aspirin do not take the morning of surgery  Anesthesia review:yes, CAD  Patient denies shortness of breath, fever, cough and chest pain at PAT appointment   Patient verbalized understanding of instructions that were given to them at the PAT appointment. Patient was also instructed that they will need to review over the PAT instructions again at home before surgery.

## 2017-09-30 MED ORDER — POTASSIUM CHLORIDE 2 MEQ/ML IV SOLN
80.0000 meq | INTRAVENOUS | Status: DC
  Filled 2017-09-30: qty 40

## 2017-09-30 MED ORDER — TRANEXAMIC ACID 1000 MG/10ML IV SOLN
1.5000 mg/kg/h | INTRAVENOUS | Status: AC
  Administered 2017-10-01: 1.5 mg/kg/h via INTRAVENOUS
  Filled 2017-09-30: qty 25

## 2017-09-30 MED ORDER — MAGNESIUM SULFATE 50 % IJ SOLN
40.0000 meq | INTRAMUSCULAR | Status: DC
  Filled 2017-09-30: qty 9.85

## 2017-09-30 MED ORDER — SODIUM CHLORIDE 0.9 % IV SOLN
1500.0000 mg | INTRAVENOUS | Status: AC
  Administered 2017-10-01: 1500 mg via INTRAVENOUS
  Filled 2017-09-30: qty 1500

## 2017-09-30 MED ORDER — NITROGLYCERIN IN D5W 200-5 MCG/ML-% IV SOLN
2.0000 ug/min | INTRAVENOUS | Status: DC
  Filled 2017-09-30: qty 250

## 2017-09-30 MED ORDER — SODIUM CHLORIDE 0.9 % IV SOLN
1.5000 g | INTRAVENOUS | Status: AC
  Administered 2017-10-01: 1.5 g via INTRAVENOUS
  Filled 2017-09-30: qty 1.5

## 2017-09-30 MED ORDER — TRANEXAMIC ACID (OHS) PUMP PRIME SOLUTION
2.0000 mg/kg | INTRAVENOUS | Status: DC
  Filled 2017-09-30: qty 1.85

## 2017-09-30 MED ORDER — SODIUM CHLORIDE 0.9 % IV SOLN
INTRAVENOUS | Status: DC
  Filled 2017-09-30: qty 30

## 2017-09-30 MED ORDER — DEXMEDETOMIDINE HCL IN NACL 400 MCG/100ML IV SOLN
0.1000 ug/kg/h | INTRAVENOUS | Status: AC
  Administered 2017-10-01: .5 ug/kg/h via INTRAVENOUS
  Filled 2017-09-30: qty 100

## 2017-09-30 MED ORDER — TRANEXAMIC ACID (OHS) BOLUS VIA INFUSION
15.0000 mg/kg | INTRAVENOUS | Status: AC
  Administered 2017-10-01: 1389 mg via INTRAVENOUS
  Filled 2017-09-30: qty 1389

## 2017-09-30 MED ORDER — MILRINONE LACTATE IN DEXTROSE 20-5 MG/100ML-% IV SOLN
0.1250 ug/kg/min | INTRAVENOUS | Status: DC
  Filled 2017-09-30: qty 100

## 2017-09-30 MED ORDER — EPINEPHRINE PF 1 MG/ML IJ SOLN
0.0000 ug/min | INTRAVENOUS | Status: DC
  Filled 2017-09-30: qty 4

## 2017-09-30 MED ORDER — SODIUM CHLORIDE 0.9 % IV SOLN
INTRAVENOUS | Status: AC
  Administered 2017-10-01: .8 [IU]/h via INTRAVENOUS
  Filled 2017-09-30: qty 1

## 2017-09-30 MED ORDER — PAPAVERINE HCL 30 MG/ML IJ SOLN
INTRAMUSCULAR | Status: AC
  Administered 2017-10-01: 500 mL
  Filled 2017-09-30: qty 2.5

## 2017-09-30 MED ORDER — DOPAMINE-DEXTROSE 3.2-5 MG/ML-% IV SOLN
0.0000 ug/kg/min | INTRAVENOUS | Status: DC
  Filled 2017-09-30: qty 250

## 2017-09-30 MED ORDER — SODIUM CHLORIDE 0.9 % IV SOLN
30.0000 ug/min | INTRAVENOUS | Status: AC
  Administered 2017-10-01: 40 ug/min via INTRAVENOUS
  Filled 2017-09-30: qty 2

## 2017-09-30 MED ORDER — SODIUM CHLORIDE 0.9 % IV SOLN
750.0000 mg | INTRAVENOUS | Status: DC
  Filled 2017-09-30: qty 750

## 2017-09-30 NOTE — H&P (Signed)
[address]       [place] [address]             [phone]      Cardiothoracic Surgery Admission History and Physical   PCP is Rusty Aus, MD  Referring Provider is [name], MD      Chief Complaint  Patient presents with  . Aortic Stenosis       HPI:  The patient is a 67 year old gentleman with a history of hypertension, hyperlipidemia, hypothyroidism, prostate cancer status post robot-assisted laparoscopic radical prostatectomy in 10/2013 with a subsequent rise in his PSA and radiation therapy in 2016, and known aortic stenosis. He says that he had a stress echocardiogram in [place] in May 2014 which was normal. He was having some exertional shortness of breath and fatigue at that time and says he was told that he has some aortic stenosis but it was not enough to warrant surgery. He says he is continued to have dyspnea on exertion since then which is recently become a little worse. He feels like it is limiting his ability to be as active as he wants to be. He can walk on level ground but on inclines or going up stairs he has to take a break. He denies any chest pain or pressure. He has occasional dizziness particularly when he gets up from a sitting or laying down position. He has had some lower extremity edema. A 2D echocardiogram on 09/22/2016 showed mild aortic stenosis with a mean gradient of 22 mmHg and a dimensionless index of 0.42. There was moderate aortic insufficiency. Left ventricular ejection fraction was normal at 55 to 60% with grade 1 diastolic dysfunction. His most recent echocardiogram on 08/09/2017 showed a rise in his mean gradient to 30 mmHg with a peak gradient of 54 mmHg. The dimensionless index was 0.42. There was still a moderate aortic insufficiency. Left ventricular ejection fraction remains normal at 60 to 65%. The left ventricular internal dimensions remain within normal limits. He underwent cardiac catheterization on  08/23/2017. This showed severe two-vessel coronary disease. There is a 95% ostial stenosis of a first diagonal branch which is a moderate-sized vessel. There is also a 75% mid to distal left circumflex stenosis before a large marginal branch which looked hazy. The LAD had no significant stenosis. The right coronary artery had no significant stenosis. The mean aortic valve gradient was 29 mmHg with the LVEDP measured at 23 mmHg. Right heart pressures were normal. There is also question of an enlarged aorta by CTA of the chest in 2017 when it was measured at 4.2 x 4.0 cm. A CT scan of the chest in September 2018 measured the aorta at 4 cm. A repeat CTA of the chest on 08/30/2017 shows a maximum diameter at the sinus level at 3.9 cm and the ascending aorta measuring 3.8 to 3.9 cm in greatest diameter. Radiology felt that the previous measurements of 4.0 and 4.2 cm were due to measurements that were not perpendicular to the long axis of the aorta.  The patient is here today with his daughter. He is divorced. She lives in [place] and he lives in [place] but comes to [place] for his medical care. He still works. His prostate cancer has been taken care of by Dr. Tresa Moore. The patient reports that after his radiation therapy his PSA level went to 0 but then has increased again and he is undergone hormone therapy.  Past Medical History:  Diagnosis Date  . Aortic stenosis   . Arthritis    lower spine- 2012  . Cardiac murmur   . ED (erectile dysfunction)   . Esophageal reflux   . Hyperlipidemia   . Hypertension   . Hypothyroid   . Prostate cancer ([place]) 10/20/13  . Prostate cancer ([place])   . S/P radiation therapy 03/05/2014 through 04/19/2014    Prostate bed 6600 cGy in 33 sessions, pelvic lymph nodes 5000 445 cGy in 33 sessions   . Stress incontinence         Past Surgical History:  Procedure Laterality Date  . COLONOSCOPY    . LYMPHADENECTOMY Bilateral 10/20/2013   Procedure: BILATERAL  PELVIC LYMPH NODE DISSECTION; Surgeon: Alexis Frock, MD; Location: [facility]; Service: Urology; Laterality: Bilateral;  . RIGHT/LEFT HEART CATH AND CORONARY ANGIOGRAPHY N/A 08/23/2017   Procedure: RIGHT/LEFT HEART CATH AND CORONARY ANGIOGRAPHY; Surgeon: Leonie Man, MD; Location: [facility]; Service: Cardiovascular; Laterality: N/A;  . ROBOT ASSISTED LAPAROSCOPIC RADICAL PROSTATECTOMY N/A 10/20/2013   Procedure: ROBOTIC ASSISTED LAPAROSCOPIC RADICAL PROSTATECTOMY WITH INDOCYANINE GREEN DYE; Surgeon: Alexis Frock, MD; Location: [facility]; Service: Urology; Laterality: N/A;  . TONSILLECTOMY    . varicocele surgery  Right         Family History  Problem Relation Age of Onset  . Emphysema Father   . Prostate cancer Father   . Breast cancer Mother    Social History  Social History        Tobacco Use  . Smoking status: Former Smoker    Packs/day: 1.00    Years: 20.00    Pack years: 20.00    Types: Cigarettes    Last attempt to quit: 02/15/1983    Years since quitting: 34.6  . Smokeless tobacco: Never Used  Substance Use Topics  . Alcohol use: Yes    Alcohol/week: 0.0 standard drinks    Comment: rarely   . Drug use: No         Current Outpatient Medications  Medication Sig Dispense Refill  . amLODipine (NORVASC) 2.5 MG tablet Take 2.5 mg by mouth daily.    [name] aspirin 81 MG tablet Take 81 mg by mouth daily.    [name] levothyroxine (SYNTHROID, LEVOTHROID) 75 MCG tablet Take 75 mcg by mouth daily before breakfast.    . lisinopril (PRINIVIL,ZESTRIL) 10 MG tablet Take 10 mg by mouth daily.    . Multiple Vitamins-Minerals (CENTRUM SILVER ADULT 50+ PO) Take 1 tablet by mouth daily.     [name] zinc gluconate 50 MG tablet Take 50 mg by mouth daily.     No current facility-administered medications for this visit.    No Known Allergies  Review of Systems  Constitutional: Positive for activity change, fatigue and unexpected weight change.  HENT: Negative. Negative for dental problem.  Saw  his dentist 2 weeks ago  Eyes: Negative.  Respiratory: Positive for cough and shortness of breath.  Cardiovascular: Positive for leg swelling. Negative for chest pain.  Gastrointestinal: Negative.  Endocrine: Negative.  Genitourinary: Negative.  Musculoskeletal: Negative.  Allergic/Immunologic: Negative.  Neurological: Positive for dizziness. Negative for syncope and headaches.  Hematological: Negative.  Psychiatric/Behavioral: Negative.   BP 135/87 (BP Location: Right Arm, Patient Position: Sitting, Cuff Size: Large)  Pulse 77  Resp 16  Ht 5\' 11"  (1.803 m)  Wt 199 lb (90.3 kg)  SpO2 93% Comment: ON RA  BMI 27.75 kg/m  Physical Exam  Constitutional: He is oriented to person, place, and  time. He appears well-developed and well-nourished. No distress.  HENT:  Head: Normocephalic and atraumatic.  Mouth/Throat: Oropharynx is clear and moist.  Eyes: Pupils are equal, round, and reactive to light. EOM are normal.  Neck: Normal range of motion. Neck supple. No JVD present. No thyromegaly present.  Cardiovascular: Normal rate, regular rhythm and intact distal pulses.  Murmur heard. 3/6 systolic murmur RSB 2/6 diastolic murmur LLSB  Pulmonary/Chest: Effort normal.  Crackles in lung bases  Abdominal: Soft. Bowel sounds are normal. He exhibits no distension. There is no tenderness.  Musculoskeletal: Normal range of motion. He exhibits no edema.  Lymphadenopathy:  He has no cervical adenopathy.  Neurological: He is alert and oriented to person, place, and time. He has normal strength. No cranial nerve deficit or sensory deficit.  Skin: Skin is warm and dry.  Psychiatric: He has a normal mood and affect.   Diagnostic Tests:  Result status: Final result  [facility]  [address], [place] [address]  [phone]  -------------------------------------------------------------------  Transthoracic Echocardiography  Patient: Demarea, Lorey  MR #: [id number]    Study Date: 08/09/2017  Gender: M  Age: 5  Height: 180.3 cm  Weight: 88.5 kg  BSA: 2.12 m^2  Pt. Status:  Room:  ATTENDING [name]  ORDERING Aaron Edelman Crenshaw  REFERRING [name]  SONOGRAPHER Wyatt Mage, RDCS  PERFORMING [facility], Outpatient  cc:  -------------------------------------------------------------------  LV EF: 60% - 65%  -------------------------------------------------------------------  Indications: Aortic stenosis (I35).  -------------------------------------------------------------------  History: PMH: Murmur. Risk factors: Prostate cancer. Former  tobacco use. Hypertension.  -------------------------------------------------------------------  Study Conclusions  - Procedure narrative: Transthoracic echocardiography. Image  quality was adequate. Intravenous contrast (Definity) was  administered to enhance Doppler signals.  - Left ventricle: The cavity size was normal. Wall thickness was  normal. Systolic function was normal. The estimated ejection  fraction was in the range of 60% to 65%.  - Aortic valve: Functionally bicuspid. There was moderate stenosis.  There was moderate regurgitation. Mean gradient (S): 30 mm Hg.  Peak gradient (S): 54 mm Hg.  - Mitral valve: Calcified annulus. Mildly thickened leaflets .  There was mild regurgitation.  - Atrial septum: No defect or patent foramen ovale was identified.  - Pulmonary arteries: PA peak pressure: 36 mm Hg (S).  -------------------------------------------------------------------  Labs, prior tests, procedures, and surgery:  Transthoracic echocardiography (09/22/2016). The aortic valve  showed mild stenosis. The aortic valve showed moderate  regurgitation. EF was 55% and PA pressure was 41 (systolic).  Aortic valve: peak gradient of 35 mm Hg and mean gradient of 22 mm  Hg.  -------------------------------------------------------------------  Study data: Comparison was made to the study of  09/22/2016. Study  status: Routine. Procedure: The patient reported no pain pre or  post test. Transthoracic echocardiography. Image quality was  adequate. Intravenous contrast (Definity) was administered to  enhance Doppler signals. Study completion: There were no  complications. Transthoracic echocardiography. M-mode,  complete 2D, spectral Doppler, and color Doppler. Birthdate:  Patient birthdate: 1950-05-15. Age: Patient is 67 yr old. Sex:  Gender: male. BMI: 27.2 kg/m^2. Blood pressure: 128/62  Patient status: Outpatient. Study date: Study date: 08/09/2017.  Study time: 07:17 AM. Location: [facility]  -------------------------------------------------------------------  -------------------------------------------------------------------  Left ventricle: The cavity size was normal. Wall thickness was  normal. Systolic function was normal. The estimated ejection  fraction was in the range of 60% to 65%.  -------------------------------------------------------------------  Aortic valve: Functionally bicuspid. Severely thickened, severely  calcified leaflets. Doppler:  There was moderate stenosis.  There was moderate regurgitation. VTI ratio of LVOT to aortic  valve: 0.45. Valve area (VTI): 1.4 cm^2. Indexed valve area (VTI):  0.66 cm^2/m^2. Peak velocity ratio of LVOT to aortic valve: 0.42.  Valve area (Vmax): 1.31 cm^2. Indexed valve area (Vmax): 0.62  cm^2/m^2. Mean velocity ratio of LVOT to aortic valve: 0.45. Valve  area (Vmean): 1.41 cm^2. Indexed valve area (Vmean): 0.66 cm^2/m^2.  Mean gradient (S): 30 mm Hg. Peak gradient (S): 54 mm Hg.  -------------------------------------------------------------------  Aorta: The aorta was normal, not dilated, and non-diseased.  -------------------------------------------------------------------  Mitral valve: Calcified annulus. Mildly thickened leaflets .  Doppler: There was mild regurgitation. Valve area by pressure  half-time: 3.55  cm^2. Indexed valve area by pressure half-time:  1.68 cm^2/m^2. Peak gradient (D): 2 mm Hg.  -------------------------------------------------------------------  Left atrium: The atrium was normal in size.  -------------------------------------------------------------------  Atrial septum: No defect or patent foramen ovale was identified.  -------------------------------------------------------------------  Right ventricle: The cavity size was normal. Wall thickness was  normal. Systolic function was normal.  -------------------------------------------------------------------  Pulmonic valve: Doppler: There was mild regurgitation.  -------------------------------------------------------------------  Tricuspid valve: Doppler: There was mild regurgitation.  -------------------------------------------------------------------  Right atrium: The atrium was normal in size.  -------------------------------------------------------------------  Pericardium: The pericardium was normal in appearance.  -------------------------------------------------------------------  Systemic veins:  Inferior vena cava: The vessel was normal in size. The  respirophasic diameter changes were in the normal range (>= 50%),  consistent with normal central venous pressure.  -------------------------------------------------------------------  Post procedure conclusions  Ascending Aorta:  - The aorta was normal, not dilated, and non-diseased.  -------------------------------------------------------------------  Measurements  Left ventricle Value Reference  LV ID, ED, PLAX chordal 46 mm 43 - 52  LV ID, ES, PLAX chordal 32 mm 23 - 38  LV fx shortening, PLAX chordal 30 % >=29  LV PW thickness, ED 9 mm ---------  IVS/LV PW ratio, ED 1.11 <=1.3  Stroke volume, 2D 104 ml ---------  Stroke volume/bsa, 2D 49 ml/m^2 ---------  LV e&', lateral 9.16 cm/s ---------  LV E/e&', lateral 8.26 ---------  LV e&', medial 6.53  cm/s ---------  LV E/e&', medial 11.59 ---------  LV e&', average 7.85 cm/s ---------  LV E/e&', average 9.65 ---------  Ventricular septum Value Reference  IVS thickness, ED 10 mm ---------  LVOT Value Reference  LVOT ID, S 20 mm ---------  LVOT area 3.14 cm^2 ---------  LVOT peak velocity, S 153 cm/s ---------  LVOT mean velocity, S 112 cm/s ---------  LVOT VTI, S 33.1 cm ---------  LVOT peak gradient, S 9 mm Hg ---------  Aortic valve Value Reference  Aortic valve peak velocity, S 366 cm/s ---------  Aortic valve mean velocity, S 250 cm/s ---------  Aortic valve VTI, S 74.1 cm ---------  Aortic mean gradient, S 30 mm Hg ---------  Aortic peak gradient, S 54 mm Hg ---------  VTI ratio, LVOT/AV 0.45 ---------  Aortic valve area, VTI 1.4 cm^2 ---------  Aortic valve area/bsa, VTI 0.66 cm^2/m^2 ---------  Velocity ratio, peak, LVOT/AV 0.42 ---------  Aortic valve area, peak velocity 1.31 cm^2 ---------  Aortic valve area/bsa, peak 0.62 cm^2/m^2 ---------  velocity  Velocity ratio, mean, LVOT/AV 0.45 ---------  Aortic valve area, mean velocity 1.41 cm^2 ---------  Aortic valve area/bsa, mean 0.66 cm^2/m^2 ---------  velocity  Aortic regurg pressure half-time 763 ms ---------  Aorta Value Reference  Aortic root ID, ED 39 mm ---------  Ascending aorta ID, A-P, S 40 mm ---------  Left  atrium Value Reference  LA ID, A-P, ES 33 mm ---------  LA ID/bsa, A-P 1.56 cm/m^2 <=2.2  LA volume, S 57.4 ml ---------  LA volume/bsa, S 27.1 ml/m^2 ---------  LA volume, ES, 1-p A4C 55 ml ---------  LA volume/bsa, ES, 1-p A4C 26 ml/m^2 ---------  LA volume, ES, 1-p A2C 58.5 ml ---------  LA volume/bsa, ES, 1-p A2C 27.6 ml/m^2 ---------  Mitral valve Value Reference  Mitral E-wave peak velocity 75.7 cm/s ---------  Mitral A-wave peak velocity 59.7 cm/s ---------  Mitral deceleration time 211 ms 150 - 230  Mitral pressure half-time 62 ms ---------  Mitral peak gradient, D 2 mm Hg ---------    Mitral E/A ratio, peak 1.3 ---------  Mitral valve area, PHT, DP 3.55 cm^2 ---------  Mitral valve area/bsa, PHT, DP 1.68 cm^2/m^2 ---------  Pulmonary arteries Value Reference  PA pressure, S, DP (H) 36 mm Hg <=30  Tricuspid valve Value Reference  Tricuspid regurg peak velocity 266 cm/s ---------  Tricuspid peak RV-RA gradient 28 mm Hg ---------  Systemic veins Value Reference  Estimated CVP 8 mm Hg ---------  Right ventricle Value Reference  TAPSE 17.5 mm ---------  RV pressure, S, DP (H) 36 mm Hg <=30  RV s&', lateral, S 9.57 cm/s ---------  Legend:  (L) and (H) mark values outside specified reference range.  -------------------------------------------------------------------  Prepared and Electronically Authenticated by  Jenkins Rouge, M.D.  [id number]:32:14  The left ventricular systolic function is normal. The left ventricular ejection fraction is greater than 65% by visual estimate.  LV end diastolic pressure is moderately elevated~23 mmHg  Moderate Aortic Stenosis - mean gradient 29.4 mmHg with ~Moderate Aortic Stenosis (~3+)  LESION #1: Ost 1st Diag lesion is 95% stenosed with 90% stenosed side branch in Lat 1st Diag.  Mid Cx lesion is 45% stenosed - upstream of Lesion #2.  LESION #2: Mid Cx to Dist Cx lesion is 75% stenosed.  Prox LAD to Mid LAD lesion is 10% stenosed. Severe two-vessel disease involving branch point of first diagonal and mid circumflex-OM.  By gradient, mean aortic stenosis of 29 mmHg, however likely underestimated in the setting of aortic insufficiency.  Normal LV function with severely elevated LVEDP  Normal Right Heart Cath Pressures with no sign of Pulmonary Hypertension.  Plan: Return to short stay for TR removal. Will need referral to CVTS after CT scan and consider TEE.  Will forward note to Dr. Stanford Breed  Recommend Aspirin 81mg  daily for moderate CAD.  Glenetta Hew, M.D., M.S.  Interventional Cardiologist   Pager # [phone]  Phone #  [phone]  [address] [place], [place] [address]  CLINICAL DATA: Moderate aortic valvular stenosis and regurgitation  by echocardiography. Previous CTA has demonstrated top-normal/mildly  dilated ascending thoracic aorta.  EXAM:  CT ANGIOGRAPHY CHEST WITH CONTRAST  TECHNIQUE:  Multidetector CT imaging of the chest was performed using the  standard protocol during bolus administration of intravenous  contrast. Multiplanar CT image reconstructions and MIPs were  obtained to evaluate the vascular anatomy.  CONTRAST: 117mL ISOVUE-370 IOPAMIDOL (ISOVUE-370) INJECTION 76%  COMPARISON: 10/27/2016 and 10/11/2015  FINDINGS:  Cardiovascular: The thoracic aorta is well opacified. There again is  noted to be a heavily calcified aortic valve. The aortic root  measures 3.9 cm at the sinuses of Valsalva. The ascending thoracic  aorta measures 3.8-3.9 cm in greatest diameter and is not overtly  aneurysmal. Previous measurements of 4.0 and 4.2 cm maximal diameter  do not appear to be perpendicular  to the long axis of the aorta and  are likely over estimates in diameter. The proximal arch measures  3.4 cm and the distal arch 3.0 cm. The descending thoracic aorta  measures 2.8 cm. No evidence of aortic dissection. Proximal great  vessels show normal patency and branching anatomy. Stable minimal  plaque at the level of the aortic arch.  The heart size is stable and within normal limits. Stable mild  calcified coronary artery plaque in a 3 vessel distribution. No  pericardial fluid identified. Central pulmonary arteries are normal  in caliber.  Mediastinum/Nodes: No enlarged mediastinal, hilar, or axillary lymph  nodes. Thyroid gland, trachea, and esophagus demonstrate no  significant findings.  Lungs/Pleura: Stable pulmonary scarring predominantly in the lower  lung zones bilaterally with some stable nodularity in the inferior  right middle lobe. There is no evidence of pulmonary  edema,  consolidation, pneumothorax or pleural fluid.  Upper Abdomen: No acute abnormality.  Musculoskeletal: No chest wall abnormality. No acute or significant  osseous findings.  Review of the MIP images confirms the above findings.  IMPRESSION:  1. The thoracic aorta is not felt to be overtly aneurysmal. Maximal  diameter measures 3.8-3.9 cm on the current study when obtained  perpendicular to the long axis of the aorta. Previous measurements  of 4.0 and 4.2 cm are likely over estimates. Annual CTA follow-up  likely is not necessary and the aortic valvular disease can likely  be monitored by echocardiography alone.  2. Evidence of coronary atherosclerosis with mild calcified plaque  in a 3 vessel distribution.  Electronically Signed  By: Aletta Edouard M.D.  On: 08/30/2017 10:25  Impression:  The 23-year-old gentleman has moderate aortic stenosis and moderate aortic insufficiency with severe two-vessel coronary disease with progressive symptoms of exertional fatigue and shortness of breath which are limiting his lifestyle. I have personally reviewed his echocardiogram, cardiac catheterization, and CTA studies. His echocardiogram shows a functionally bicuspid aortic valve with severely calcified leaflets and restricted mobility with a mean gradient of 30 mmHg consistent with moderate aortic stenosis as well as moderate aortic insufficiency. His left ventricular systolic function is normal with normal internal dimensions. Cardiac catheterization showed severe two-vessel coronary disease as noted above with high-grade stenoses in the diagonal branch and a large obtuse marginal. His LVEDP was elevated at 23 mmHg consistent with fluid overload. I suspect that his symptoms are probably related to a combination of his moderate aortic stenosis and insufficiency as well as coronary disease. He feels like this is significantly limiting his ability to remain active and would like to have it repaired. I  have recommended a bioprosthetic valve given his age of 50. Given his symptoms I think it is reasonable to proceed with aortic valve replacement and coronary bypass graft surgery. I discussed the operative procedure with the patient and his daughter including alternatives, benefits and risks; including but not limited to bleeding, blood transfusion, infection, stroke, myocardial infarction, graft failure, heart block requiring a permanent pacemaker, organ dysfunction, and death. [name] understands and agrees to proceed.   Plan:   Aortic valve replacement using a bioprosthetic valve and coronary artery bypass graft surgery.  Gaye Pollack, MD  [facility]  [phone]

## 2017-09-30 NOTE — Anesthesia Preprocedure Evaluation (Addendum)
Anesthesia Evaluation  Patient identified by MRN, date of birth, ID band Patient awake    Reviewed: Allergy & Precautions, NPO status , Patient's Chart, lab work & pertinent test results  History of Anesthesia Complications Negative for: history of anesthetic complications  Airway Mallampati: III  TM Distance: >3 FB Neck ROM: Full    Dental  (+) Dental Advisory Given, Teeth Intact   Pulmonary former smoker,    breath sounds clear to auscultation       Cardiovascular Exercise Tolerance: Good hypertension, Pt. on medications (-) angina+ CAD  + Valvular Problems/Murmurs AS and AI  Rhythm:Regular Rate:Normal + Systolic murmurs  '19 Carotid US - 0% right ICAS, 1-39% left ICAS  '19 Cath - The left ventricular systolic function is normal. The left ventricular ejection fraction is greater than 65% by visual estimate. LVEDP is moderately elevated ~23 mmHg Moderate Aortic Stenosis - mean gradient 29.4 mmHg with ~Moderate Aortic Stenosis (~3+) LESION #1: Ost 1st Diag lesion is 95% stenosed with 90% stenosed side branch in Lat 1st Diag. Mid Cx lesion is 45% stenosed - upstream of Lesion #2. LESION #2: Mid Cx to Dist Cx lesion is 75% stenosed. Prox LAD to Mid LAD lesion is 10% stenosed.  '19 TTE -  LV cavity size and wall thickness were normal. EF 60% to 65%. Functionally bicuspid AV with moderate AS and AI. Mean gradient (S): 30 mm Hg. Peak gradient (S): 54 mm Hg. Mild MR. PASP 36 mm Hg    Neuro/Psych negative neurological ROS  negative psych ROS   GI/Hepatic Neg liver ROS, GERD  Controlled,  Endo/Other  Hypothyroidism   Renal/GU Renal InsufficiencyRenal disease    Prostate cancer s/p radiation and prostatectomy Stress incontinence     Musculoskeletal  (+) Arthritis ,   Abdominal   Peds  Hematology negative hematology ROS (+)   Anesthesia Other Findings   Reproductive/Obstetrics                             Anesthesia Physical Anesthesia Plan  ASA: IV  Anesthesia Plan: General   Post-op Pain Management:    Induction: Intravenous  PONV Risk Score and Plan: 2 and Treatment may vary due to age or medical condition  Airway Management Planned: Oral ETT  Additional Equipment: Arterial line, CVP, PA Cath, TEE and Ultrasound Guidance Line Placement  Intra-op Plan:   Post-operative Plan: Post-operative intubation/ventilation  Informed Consent: I have reviewed the patients History and Physical, chart, labs and discussed the procedure including the risks, benefits and alternatives for the proposed anesthesia with the patient or authorized representative who has indicated his/her understanding and acceptance.   Dental advisory given  Plan Discussed with: CRNA and Anesthesiologist  Anesthesia Plan Comments:        Anesthesia Quick Evaluation

## 2017-10-01 ENCOUNTER — Encounter (HOSPITAL_COMMUNITY): Payer: Self-pay | Admitting: *Deleted

## 2017-10-01 ENCOUNTER — Inpatient Hospital Stay (HOSPITAL_COMMUNITY): Payer: BLUE CROSS/BLUE SHIELD | Admitting: Physician Assistant

## 2017-10-01 ENCOUNTER — Inpatient Hospital Stay (HOSPITAL_COMMUNITY): Payer: BLUE CROSS/BLUE SHIELD

## 2017-10-01 ENCOUNTER — Other Ambulatory Visit: Payer: Self-pay

## 2017-10-01 ENCOUNTER — Inpatient Hospital Stay (HOSPITAL_COMMUNITY): Payer: BLUE CROSS/BLUE SHIELD | Admitting: Anesthesiology

## 2017-10-01 ENCOUNTER — Inpatient Hospital Stay (HOSPITAL_COMMUNITY)
Admission: RE | Admit: 2017-10-01 | Discharge: 2017-10-05 | DRG: 220 | Disposition: A | Discharge status: Home / Self Care | Payer: BLUE CROSS/BLUE SHIELD | Attending: Surgery | Admitting: Surgery

## 2017-10-01 ENCOUNTER — Inpatient Hospital Stay (HOSPITAL_COMMUNITY)
Admission: RE | Disposition: A | Discharge status: Home / Self Care | Payer: Self-pay | Source: Home / Self Care | Attending: Surgery

## 2017-10-01 DIAGNOSIS — Z951 Presence of aortocoronary bypass graft: Secondary | ICD-10-CM

## 2017-10-01 DIAGNOSIS — Z9079 Acquired absence of other genital organ(s): Secondary | ICD-10-CM

## 2017-10-01 DIAGNOSIS — Z7982 Long term (current) use of aspirin: Secondary | ICD-10-CM | POA: Diagnosis not present

## 2017-10-01 DIAGNOSIS — Z79899 Other long term (current) drug therapy: Secondary | ICD-10-CM | POA: Diagnosis not present

## 2017-10-01 DIAGNOSIS — Z8546 Personal history of malignant neoplasm of prostate: Secondary | ICD-10-CM

## 2017-10-01 DIAGNOSIS — Z87891 Personal history of nicotine dependence: Secondary | ICD-10-CM

## 2017-10-01 DIAGNOSIS — Z952 Presence of prosthetic heart valve: Secondary | ICD-10-CM

## 2017-10-01 DIAGNOSIS — I2510 Atherosclerotic heart disease of native coronary artery without angina pectoris: Secondary | ICD-10-CM

## 2017-10-01 DIAGNOSIS — Z79890 Hormone replacement therapy (postmenopausal): Secondary | ICD-10-CM

## 2017-10-01 DIAGNOSIS — E8770 Fluid overload, unspecified: Secondary | ICD-10-CM | POA: Diagnosis not present

## 2017-10-01 DIAGNOSIS — I350 Nonrheumatic aortic (valve) stenosis: Secondary | ICD-10-CM

## 2017-10-01 DIAGNOSIS — Z09 Encounter for follow-up examination after completed treatment for conditions other than malignant neoplasm: Secondary | ICD-10-CM

## 2017-10-01 DIAGNOSIS — D62 Acute posthemorrhagic anemia: Secondary | ICD-10-CM | POA: Diagnosis not present

## 2017-10-01 DIAGNOSIS — Z8042 Family history of malignant neoplasm of prostate: Secondary | ICD-10-CM

## 2017-10-01 DIAGNOSIS — I10 Essential (primary) hypertension: Secondary | ICD-10-CM | POA: Diagnosis present

## 2017-10-01 DIAGNOSIS — E785 Hyperlipidemia, unspecified: Secondary | ICD-10-CM | POA: Diagnosis present

## 2017-10-01 DIAGNOSIS — E039 Hypothyroidism, unspecified: Secondary | ICD-10-CM | POA: Diagnosis present

## 2017-10-01 DIAGNOSIS — J9811 Atelectasis: Secondary | ICD-10-CM | POA: Diagnosis not present

## 2017-10-01 DIAGNOSIS — Z923 Personal history of irradiation: Secondary | ICD-10-CM

## 2017-10-01 DIAGNOSIS — I352 Nonrheumatic aortic (valve) stenosis with insufficiency: Secondary | ICD-10-CM | POA: Diagnosis present

## 2017-10-01 HISTORY — DX: Presence of aortocoronary bypass graft: Z95.1

## 2017-10-01 HISTORY — PX: TEE WITHOUT CARDIOVERSION: SHX5443

## 2017-10-01 HISTORY — DX: Presence of prosthetic heart valve: Z95.2

## 2017-10-01 HISTORY — PX: AORTIC VALVE REPLACEMENT: SHX41

## 2017-10-01 HISTORY — PX: CORONARY ARTERY BYPASS GRAFT: SHX141

## 2017-10-01 LAB — POCT I-STAT 3, ART BLOOD GAS (G3+)
ACID-BASE EXCESS: 3 mmol/L — AB (ref 0.0–2.0)
ACID-BASE EXCESS: 1 mmol/L (ref 0.0–2.0)
ACID-BASE EXCESS: 2 mmol/L (ref 0.0–2.0)
Acid-Base Excess: 1 mmol/L (ref 0.0–2.0)
Acid-base deficit: 2 mmol/L (ref 0.0–2.0)
Acid-base deficit: 6 mmol/L — ABNORMAL HIGH (ref 0.0–2.0)
BICARBONATE: 25.9 mmol/L (ref 20.0–28.0)
BICARBONATE: 27 mmol/L (ref 20.0–28.0)
Bicarbonate: 28 mmol/L (ref 20.0–28.0)
Bicarbonate: 23.4 mmol/L (ref 20.0–28.0)
Bicarbonate: 19.7 mmol/L — ABNORMAL LOW (ref 20.0–28.0)
Bicarbonate: 25.6 mmol/L (ref 20.0–28.0)
O2 SAT: 96 %
O2 SAT: 97 %
O2 Saturation: 100 %
O2 Saturation: 99 %
O2 Saturation: 96 %
O2 Saturation: 98 %
PCO2 ART: 35.9 mmHg (ref 32.0–48.0)
PCO2 ART: 41.6 mmHg (ref 32.0–48.0)
PH ART: 7.373 (ref 7.350–7.450)
PH ART: 7.339 — AB (ref 7.350–7.450)
PH ART: 7.414 (ref 7.350–7.450)
PH ART: 7.399 (ref 7.350–7.450)
PO2 ART: 86 mmHg (ref 83.0–108.0)
Patient temperature: 35.4
Patient temperature: 37.4
TCO2: 29 mmol/L (ref 22–32)
TCO2: 25 mmol/L (ref 22–32)
TCO2: 21 mmol/L — AB (ref 22–32)
TCO2: 27 mmol/L (ref 22–32)
TCO2: 28 mmol/L (ref 22–32)
TCO2: 27 mmol/L (ref 22–32)
pCO2 arterial: 46.7 mmHg (ref 32.0–48.0)
pCO2 arterial: 40.1 mmHg (ref 32.0–48.0)
pCO2 arterial: 43.3 mmHg (ref 32.0–48.0)
pCO2 arterial: 42.3 mmHg (ref 32.0–48.0)
pH, Arterial: 7.386 (ref 7.350–7.450)
pH, Arterial: 7.383 (ref 7.350–7.450)
pO2, Arterial: 423 mmHg — ABNORMAL HIGH (ref 83.0–108.0)
pO2, Arterial: 134 mmHg — ABNORMAL HIGH (ref 83.0–108.0)
pO2, Arterial: 79 mmHg — ABNORMAL LOW (ref 83.0–108.0)
pO2, Arterial: 108 mmHg (ref 83.0–108.0)
pO2, Arterial: 90 mmHg (ref 83.0–108.0)

## 2017-10-01 LAB — POCT I-STAT, CHEM 8
BUN: 17 mg/dL (ref 8–23)
BUN: 16 mg/dL (ref 8–23)
BUN: 16 mg/dL (ref 8–23)
BUN: 17 mg/dL (ref 8–23)
BUN: 17 mg/dL (ref 8–23)
BUN: 17 mg/dL (ref 8–23)
BUN: 14 mg/dL (ref 8–23)
CALCIUM ION: 1.25 mmol/L (ref 1.15–1.40)
CALCIUM ION: 1.03 mmol/L — AB (ref 1.15–1.40)
CALCIUM ION: 1.23 mmol/L (ref 1.15–1.40)
CALCIUM ION: 1.06 mmol/L — AB (ref 1.15–1.40)
CALCIUM ION: 1.08 mmol/L — AB (ref 1.15–1.40)
CALCIUM ION: 1.09 mmol/L — AB (ref 1.15–1.40)
CHLORIDE: 104 mmol/L (ref 98–111)
CHLORIDE: 102 mmol/L (ref 98–111)
CHLORIDE: 101 mmol/L (ref 98–111)
CREATININE: 1.1 mg/dL (ref 0.61–1.24)
CREATININE: 0.9 mg/dL (ref 0.61–1.24)
CREATININE: 1 mg/dL (ref 0.61–1.24)
CREATININE: 1 mg/dL (ref 0.61–1.24)
Calcium, Ion: 1.11 mmol/L — ABNORMAL LOW (ref 1.15–1.40)
Chloride: 102 mmol/L (ref 98–111)
Chloride: 105 mmol/L (ref 98–111)
Chloride: 103 mmol/L (ref 98–111)
Chloride: 103 mmol/L (ref 98–111)
Creatinine, Ser: 0.9 mg/dL (ref 0.61–1.24)
Creatinine, Ser: 1 mg/dL (ref 0.61–1.24)
Creatinine, Ser: 1 mg/dL (ref 0.61–1.24)
GLUCOSE: 100 mg/dL — AB (ref 70–99)
GLUCOSE: 94 mg/dL (ref 70–99)
GLUCOSE: 97 mg/dL (ref 70–99)
GLUCOSE: 123 mg/dL — AB (ref 70–99)
Glucose, Bld: 122 mg/dL — ABNORMAL HIGH (ref 70–99)
Glucose, Bld: 117 mg/dL — ABNORMAL HIGH (ref 70–99)
Glucose, Bld: 142 mg/dL — ABNORMAL HIGH (ref 70–99)
HCT: 36 % — ABNORMAL LOW (ref 39.0–52.0)
HCT: 25 % — ABNORMAL LOW (ref 39.0–52.0)
HCT: 30 % — ABNORMAL LOW (ref 39.0–52.0)
HCT: 24 % — ABNORMAL LOW (ref 39.0–52.0)
HCT: 25 % — ABNORMAL LOW (ref 39.0–52.0)
HEMATOCRIT: 25 % — AB (ref 39.0–52.0)
HEMATOCRIT: 27 % — AB (ref 39.0–52.0)
HEMOGLOBIN: 10.2 g/dL — AB (ref 13.0–17.0)
HEMOGLOBIN: 8.5 g/dL — AB (ref 13.0–17.0)
HEMOGLOBIN: 8.5 g/dL — AB (ref 13.0–17.0)
HEMOGLOBIN: 8.2 g/dL — AB (ref 13.0–17.0)
Hemoglobin: 12.2 g/dL — ABNORMAL LOW (ref 13.0–17.0)
Hemoglobin: 8.5 g/dL — ABNORMAL LOW (ref 13.0–17.0)
Hemoglobin: 9.2 g/dL — ABNORMAL LOW (ref 13.0–17.0)
POTASSIUM: 5.5 mmol/L — AB (ref 3.5–5.1)
Potassium: 4 mmol/L (ref 3.5–5.1)
Potassium: 4.1 mmol/L (ref 3.5–5.1)
Potassium: 4.3 mmol/L (ref 3.5–5.1)
Potassium: 4.9 mmol/L (ref 3.5–5.1)
Potassium: 4.9 mmol/L (ref 3.5–5.1)
Potassium: 4 mmol/L (ref 3.5–5.1)
SODIUM: 136 mmol/L (ref 135–145)
SODIUM: 137 mmol/L (ref 135–145)
Sodium: 141 mmol/L (ref 135–145)
Sodium: 140 mmol/L (ref 135–145)
Sodium: 140 mmol/L (ref 135–145)
Sodium: 137 mmol/L (ref 135–145)
Sodium: 139 mmol/L (ref 135–145)
TCO2: 28 mmol/L (ref 22–32)
TCO2: 25 mmol/L (ref 22–32)
TCO2: 29 mmol/L (ref 22–32)
TCO2: 27 mmol/L (ref 22–32)
TCO2: 23 mmol/L (ref 22–32)
TCO2: 25 mmol/L (ref 22–32)
TCO2: 25 mmol/L (ref 22–32)

## 2017-10-01 LAB — CBC
HCT: 29.3 % — ABNORMAL LOW (ref 39.0–52.0)
HCT: 31.4 % — ABNORMAL LOW (ref 39.0–52.0)
HEMOGLOBIN: 10.2 g/dL — AB (ref 13.0–17.0)
Hemoglobin: 9.4 g/dL — ABNORMAL LOW (ref 13.0–17.0)
MCH: 31.4 pg (ref 26.0–34.0)
MCH: 31.5 pg (ref 26.0–34.0)
MCHC: 32.1 g/dL (ref 30.0–36.0)
MCHC: 32.5 g/dL (ref 30.0–36.0)
MCV: 98 fL (ref 78.0–100.0)
MCV: 96.9 fL (ref 78.0–100.0)
PLATELETS: 87 10*3/uL — AB (ref 150–400)
PLATELETS: 130 10*3/uL — AB (ref 150–400)
RBC: 2.99 MIL/uL — ABNORMAL LOW (ref 4.22–5.81)
RBC: 3.24 MIL/uL — AB (ref 4.22–5.81)
RDW: 13.1 % (ref 11.5–15.5)
RDW: 13.2 % (ref 11.5–15.5)
WBC: 7.9 10*3/uL (ref 4.0–10.5)
WBC: 10.1 10*3/uL (ref 4.0–10.5)

## 2017-10-01 LAB — GLUCOSE, CAPILLARY
GLUCOSE-CAPILLARY: 147 mg/dL — AB (ref 70–99)
GLUCOSE-CAPILLARY: 125 mg/dL — AB (ref 70–99)
GLUCOSE-CAPILLARY: 143 mg/dL — AB (ref 70–99)
GLUCOSE-CAPILLARY: 115 mg/dL — AB (ref 70–99)
Glucose-Capillary: 148 mg/dL — ABNORMAL HIGH (ref 70–99)
Glucose-Capillary: 148 mg/dL — ABNORMAL HIGH (ref 70–99)
Glucose-Capillary: 134 mg/dL — ABNORMAL HIGH (ref 70–99)
Glucose-Capillary: 120 mg/dL — ABNORMAL HIGH (ref 70–99)
Glucose-Capillary: 114 mg/dL — ABNORMAL HIGH (ref 70–99)

## 2017-10-01 LAB — POCT I-STAT 4, (NA,K, GLUC, HGB,HCT)
GLUCOSE: 115 mg/dL — AB (ref 70–99)
HCT: 25 % — ABNORMAL LOW (ref 39.0–52.0)
Hemoglobin: 8.5 g/dL — ABNORMAL LOW (ref 13.0–17.0)
Potassium: 4.2 mmol/L (ref 3.5–5.1)
Sodium: 137 mmol/L (ref 135–145)

## 2017-10-01 LAB — PROTIME-INR
INR: 1.49
PROTHROMBIN TIME: 17.9 s — AB (ref 11.4–15.2)

## 2017-10-01 LAB — CREATININE, SERUM
CREATININE: 1.12 mg/dL (ref 0.61–1.24)
GFR calc non Af Amer: >60 mL/min (ref >60)

## 2017-10-01 LAB — APTT: aPTT: 37 seconds — ABNORMAL HIGH (ref 24–36)

## 2017-10-01 LAB — PLATELET COUNT: PLATELETS: 122 10*3/uL — AB (ref 150–400)

## 2017-10-01 LAB — ECHO TEE
AOPV: 302 m/s
AVG: 14 mmHg
AVPG: 37 mmHg
AVVELRATIO: 0.45
LVOT peak vel: 136 cm/s

## 2017-10-01 LAB — HEMOGLOBIN AND HEMATOCRIT, BLOOD
HCT: 28.1 % — ABNORMAL LOW (ref 39.0–52.0)
Hemoglobin: 9.1 g/dL — ABNORMAL LOW (ref 13.0–17.0)

## 2017-10-01 LAB — MAGNESIUM: Magnesium: 2.9 mg/dL — ABNORMAL HIGH (ref 1.7–2.4)

## 2017-10-01 SURGERY — CORONARY ARTERY BYPASS GRAFTING (CABG)
Anesthesia: General | Site: Chest

## 2017-10-01 MED ORDER — PROTAMINE SULFATE 10 MG/ML IV SOLN
INTRAVENOUS | Status: AC
  Filled 2017-10-01: qty 25

## 2017-10-01 MED ORDER — FAMOTIDINE IN NACL 20-0.9 MG/50ML-% IV SOLN
20.0000 mg | Freq: Two times a day (BID) | INTRAVENOUS | Status: AC
  Administered 2017-10-01: 20 mg via INTRAVENOUS

## 2017-10-01 MED ORDER — METOPROLOL TARTRATE 5 MG/5ML IV SOLN
2.5000 mg | INTRAVENOUS | Status: DC | PRN
  Administered 2017-10-03: 5 mg via INTRAVENOUS
  Filled 2017-10-01: qty 5

## 2017-10-01 MED ORDER — ASPIRIN 81 MG PO CHEW: 324.0000 mg | CHEWABLE_TABLET | Freq: Every day | ORAL | Status: DC

## 2017-10-01 MED ORDER — ACETAMINOPHEN 500 MG PO TABS
1000.0000 mg | ORAL_TABLET | Freq: Four times a day (QID) | ORAL | Status: DC
  Administered 2017-10-02 – 2017-10-05 (×12): 1000 mg via ORAL
  Filled 2017-10-01 (×13): qty 2

## 2017-10-01 MED ORDER — PROPOFOL 10 MG/ML IV BOLUS
INTRAVENOUS | Status: DC | PRN
  Administered 2017-10-01: 30 mg via INTRAVENOUS
  Administered 2017-10-01: 50 mg via INTRAVENOUS
  Administered 2017-10-01 (×2): 30 mg via INTRAVENOUS

## 2017-10-01 MED ORDER — LACTATED RINGERS IV SOLN
INTRAVENOUS | Status: DC | PRN
  Administered 2017-10-01 (×3): via INTRAVENOUS

## 2017-10-01 MED ORDER — OXYCODONE HCL 5 MG PO TABS: 5.0000 mg | ORAL_TABLET | ORAL | Status: DC | PRN

## 2017-10-01 MED ORDER — ROCURONIUM BROMIDE 50 MG/5ML IV SOSY
PREFILLED_SYRINGE | INTRAVENOUS | Status: AC
  Filled 2017-10-01: qty 5

## 2017-10-01 MED ORDER — PHENYLEPHRINE 40 MCG/ML (10ML) SYRINGE FOR IV PUSH (FOR BLOOD PRESSURE SUPPORT)
PREFILLED_SYRINGE | INTRAVENOUS | Status: AC
  Filled 2017-10-01: qty 20

## 2017-10-01 MED ORDER — ORAL CARE MOUTH RINSE
15.0000 mL | Freq: Two times a day (BID) | OROMUCOSAL | Status: DC
  Administered 2017-10-01 – 2017-10-03 (×3): 15 mL via OROMUCOSAL

## 2017-10-01 MED ORDER — MIDAZOLAM HCL 10 MG/2ML IJ SOLN
INTRAMUSCULAR | Status: AC
  Filled 2017-10-01: qty 2

## 2017-10-01 MED ORDER — EPHEDRINE SULFATE-NACL 50-0.9 MG/10ML-% IV SOSY
PREFILLED_SYRINGE | INTRAVENOUS | Status: DC | PRN
  Administered 2017-10-01: 5 mg via INTRAVENOUS

## 2017-10-01 MED ORDER — PHENYLEPHRINE HCL 10 MG/ML IJ SOLN
INTRAMUSCULAR | Status: AC
  Filled 2017-10-01: qty 1

## 2017-10-01 MED ORDER — CHLORHEXIDINE GLUCONATE 0.12 % MT SOLN
15.0000 mL | OROMUCOSAL | Status: AC
  Administered 2017-10-01: 15 mL via OROMUCOSAL

## 2017-10-01 MED ORDER — MORPHINE SULFATE (PF) 2 MG/ML IV SOLN: 1.0000 mg | INTRAVENOUS | Status: AC | PRN

## 2017-10-01 MED ORDER — BISACODYL 5 MG PO TBEC
10.0000 mg | DELAYED_RELEASE_TABLET | Freq: Every day | ORAL | Status: DC
  Administered 2017-10-02 – 2017-10-03 (×2): 10 mg via ORAL
  Filled 2017-10-01 (×2): qty 2

## 2017-10-01 MED ORDER — SODIUM CHLORIDE 0.9 % IV SOLN
1.5000 g | Freq: Two times a day (BID) | INTRAVENOUS | Status: AC
  Administered 2017-10-01 – 2017-10-03 (×4): 1.5 g via INTRAVENOUS
  Filled 2017-10-01 (×5): qty 1.5

## 2017-10-01 MED ORDER — CHLORHEXIDINE GLUCONATE 0.12 % MT SOLN
OROMUCOSAL | Status: AC
  Administered 2017-10-01: 15 mL via OROMUCOSAL
  Filled 2017-10-01: qty 15

## 2017-10-01 MED ORDER — MUPIROCIN 2 % EX OINT: 1.0000 "application " | TOPICAL_OINTMENT | Freq: Two times a day (BID) | CUTANEOUS | Status: DC

## 2017-10-01 MED ORDER — MIDAZOLAM HCL 2 MG/2ML IJ SOLN: 2.0000 mg | INTRAMUSCULAR | Status: DC | PRN

## 2017-10-01 MED ORDER — ACETAMINOPHEN 650 MG RE SUPP
650.0000 mg | Freq: Once | RECTAL | Status: AC
  Administered 2017-10-01: 650 mg via RECTAL

## 2017-10-01 MED ORDER — ONDANSETRON HCL 4 MG/2ML IJ SOLN
4.0000 mg | Freq: Four times a day (QID) | INTRAMUSCULAR | Status: DC | PRN
  Administered 2017-10-02 (×2): 4 mg via INTRAVENOUS
  Filled 2017-10-01 (×2): qty 2

## 2017-10-01 MED ORDER — PROPOFOL 10 MG/ML IV BOLUS
INTRAVENOUS | Status: AC
  Filled 2017-10-01: qty 20

## 2017-10-01 MED ORDER — SODIUM CHLORIDE 0.9% FLUSH: 3.0000 mL | INTRAVENOUS | Status: DC | PRN

## 2017-10-01 MED ORDER — HEMOSTATIC AGENTS (NO CHARGE) OPTIME
TOPICAL | Status: DC | PRN
  Administered 2017-10-01 (×3): 1 via TOPICAL

## 2017-10-01 MED ORDER — ACETAMINOPHEN 160 MG/5ML PO SOLN: 650.0000 mg | Freq: Once | ORAL | Status: AC

## 2017-10-01 MED ORDER — HEPARIN SODIUM (PORCINE) 1000 UNIT/ML IJ SOLN
INTRAMUSCULAR | Status: AC
  Filled 2017-10-01: qty 1

## 2017-10-01 MED ORDER — SODIUM CHLORIDE 0.9 % IV SOLN
INTRAVENOUS | Status: DC
  Administered 2017-10-01: 14:00:00 via INTRAVENOUS

## 2017-10-01 MED ORDER — EPHEDRINE 5 MG/ML INJ
INTRAVENOUS | Status: AC
  Filled 2017-10-01: qty 10

## 2017-10-01 MED ORDER — METOPROLOL TARTRATE 12.5 MG HALF TABLET
12.5000 mg | ORAL_TABLET | Freq: Two times a day (BID) | ORAL | Status: DC
  Administered 2017-10-02 – 2017-10-05 (×6): 12.5 mg via ORAL
  Filled 2017-10-01 (×6): qty 1

## 2017-10-01 MED ORDER — PROTAMINE SULFATE 10 MG/ML IV SOLN
INTRAVENOUS | Status: AC
  Filled 2017-10-01: qty 5

## 2017-10-01 MED ORDER — FENTANYL CITRATE (PF) 250 MCG/5ML IJ SOLN
INTRAMUSCULAR | Status: AC
  Filled 2017-10-01: qty 5

## 2017-10-01 MED ORDER — LACTATED RINGERS IV SOLN: INTRAVENOUS | Status: DC

## 2017-10-01 MED ORDER — PROTAMINE SULFATE 10 MG/ML IV SOLN
INTRAVENOUS | Status: DC | PRN
  Administered 2017-10-01: 10 mg via INTRAVENOUS
  Administered 2017-10-01: 310 mg via INTRAVENOUS

## 2017-10-01 MED ORDER — THROMBIN 20000 UNITS EX KIT
PACK | CUTANEOUS | Status: AC
  Filled 2017-10-01: qty 1

## 2017-10-01 MED ORDER — LACTATED RINGERS IV SOLN
INTRAVENOUS | Status: DC | PRN
  Administered 2017-10-01: 08:00:00 via INTRAVENOUS

## 2017-10-01 MED ORDER — INSULIN REGULAR BOLUS VIA INFUSION
0.0000 [IU] | Freq: Three times a day (TID) | INTRAVENOUS | Status: DC
  Filled 2017-10-01: qty 10

## 2017-10-01 MED ORDER — SODIUM CHLORIDE 0.9% FLUSH: 10.0000 mL | INTRAVENOUS | Status: DC | PRN

## 2017-10-01 MED ORDER — MAGNESIUM SULFATE 4 GM/100ML IV SOLN
4.0000 g | Freq: Once | INTRAVENOUS | Status: AC
  Administered 2017-10-01: 4 g via INTRAVENOUS
  Filled 2017-10-01: qty 100

## 2017-10-01 MED ORDER — METOPROLOL TARTRATE 12.5 MG HALF TABLET: 12.5000 mg | ORAL_TABLET | Freq: Once | ORAL | Status: DC

## 2017-10-01 MED ORDER — LACTATED RINGERS IV SOLN
INTRAVENOUS | Status: DC
  Administered 2017-10-02: 22:00:00 via INTRAVENOUS

## 2017-10-01 MED ORDER — SODIUM CHLORIDE 0.9% FLUSH: 3.0000 mL | Freq: Two times a day (BID) | INTRAVENOUS | Status: DC

## 2017-10-01 MED ORDER — MORPHINE SULFATE (PF) 2 MG/ML IV SOLN
2.0000 mg | INTRAVENOUS | Status: DC | PRN
  Administered 2017-10-01: 2 mg via INTRAVENOUS
  Administered 2017-10-02: 1 mg via INTRAVENOUS
  Filled 2017-10-01 (×2): qty 1

## 2017-10-01 MED ORDER — THROMBIN 5000 UNITS EX SOLR
CUTANEOUS | Status: DC | PRN
  Administered 2017-10-01: 5000 [IU] via TOPICAL

## 2017-10-01 MED ORDER — DEXMEDETOMIDINE HCL IN NACL 200 MCG/50ML IV SOLN
0.0000 ug/kg/h | INTRAVENOUS | Status: DC
  Administered 2017-10-01: 0.3 ug/kg/h via INTRAVENOUS
  Filled 2017-10-01: qty 50

## 2017-10-01 MED ORDER — CHLORHEXIDINE GLUCONATE CLOTH 2 % EX PADS
6.0000 | MEDICATED_PAD | Freq: Every day | CUTANEOUS | Status: DC
  Administered 2017-10-01 – 2017-10-03 (×3): 6 via TOPICAL

## 2017-10-01 MED ORDER — BISACODYL 10 MG RE SUPP: 10.0000 mg | Freq: Every day | RECTAL | Status: DC

## 2017-10-01 MED ORDER — CHLORHEXIDINE GLUCONATE 4 % EX LIQD: 30.0000 mL | CUTANEOUS | Status: DC

## 2017-10-01 MED ORDER — ROCURONIUM BROMIDE 10 MG/ML (PF) SYRINGE
PREFILLED_SYRINGE | INTRAVENOUS | Status: DC | PRN
  Administered 2017-10-01 (×4): 50 mg via INTRAVENOUS

## 2017-10-01 MED ORDER — SODIUM CHLORIDE 0.9 % IV SOLN
INTRAVENOUS | Status: DC
  Filled 2017-10-01: qty 1

## 2017-10-01 MED ORDER — ARTIFICIAL TEARS OPHTHALMIC OINT
TOPICAL_OINTMENT | OPHTHALMIC | Status: DC | PRN
  Administered 2017-10-01: 1 via OPHTHALMIC

## 2017-10-01 MED ORDER — SODIUM BICARBONATE 8.4 % IV SOLN
100.0000 meq | Freq: Once | INTRAVENOUS | Status: AC
  Administered 2017-10-01: 100 meq via INTRAVENOUS

## 2017-10-01 MED ORDER — ASPIRIN EC 325 MG PO TBEC
325.0000 mg | DELAYED_RELEASE_TABLET | Freq: Every day | ORAL | Status: DC
  Administered 2017-10-02 – 2017-10-05 (×4): 325 mg via ORAL
  Filled 2017-10-01 (×4): qty 1

## 2017-10-01 MED ORDER — LEVOTHYROXINE SODIUM 75 MCG PO TABS
75.0000 ug | ORAL_TABLET | Freq: Every day | ORAL | Status: DC
  Administered 2017-10-02 – 2017-10-05 (×4): 75 ug via ORAL
  Filled 2017-10-01 (×4): qty 1

## 2017-10-01 MED ORDER — CHLORHEXIDINE GLUCONATE 0.12% ORAL RINSE (MEDLINE KIT): 15.0000 mL | Freq: Two times a day (BID) | OROMUCOSAL | Status: DC

## 2017-10-01 MED ORDER — MIDAZOLAM HCL 5 MG/5ML IJ SOLN
INTRAMUSCULAR | Status: DC | PRN
  Administered 2017-10-01: 2 mg via INTRAVENOUS
  Administered 2017-10-01: 3 mg via INTRAVENOUS
  Administered 2017-10-01: 2 mg via INTRAVENOUS

## 2017-10-01 MED ORDER — ORAL CARE MOUTH RINSE
15.0000 mL | OROMUCOSAL | Status: DC
  Administered 2017-10-01: 15 mL via OROMUCOSAL

## 2017-10-01 MED ORDER — FENTANYL CITRATE (PF) 250 MCG/5ML IJ SOLN
INTRAMUSCULAR | Status: AC
  Filled 2017-10-01: qty 25

## 2017-10-01 MED ORDER — SODIUM CHLORIDE 0.9% FLUSH
10.0000 mL | Freq: Two times a day (BID) | INTRAVENOUS | Status: DC
  Administered 2017-10-01 – 2017-10-03 (×3): 10 mL

## 2017-10-01 MED ORDER — ALBUMIN HUMAN 5 % IV SOLN
INTRAVENOUS | Status: DC | PRN
  Administered 2017-10-01 (×2): via INTRAVENOUS

## 2017-10-01 MED ORDER — DOCUSATE SODIUM 100 MG PO CAPS
200.0000 mg | ORAL_CAPSULE | Freq: Every day | ORAL | Status: DC
  Administered 2017-10-02 – 2017-10-03 (×2): 200 mg via ORAL
  Filled 2017-10-01 (×2): qty 2

## 2017-10-01 MED ORDER — CHLORHEXIDINE GLUCONATE 0.12 % MT SOLN
15.0000 mL | Freq: Once | OROMUCOSAL | Status: AC
  Administered 2017-10-01: 15 mL via OROMUCOSAL

## 2017-10-01 MED ORDER — DIPHENHYDRAMINE HCL 50 MG/ML IJ SOLN
INTRAMUSCULAR | Status: DC | PRN
  Administered 2017-10-01: 25 mg via INTRAVENOUS

## 2017-10-01 MED ORDER — METOPROLOL TARTRATE 25 MG/10 ML ORAL SUSPENSION: 12.5000 mg | Freq: Two times a day (BID) | ORAL | Status: DC

## 2017-10-01 MED ORDER — SODIUM CHLORIDE 0.9 % IV SOLN
INTRAVENOUS | Status: DC | PRN
  Administered 2017-10-01: 750 mg via INTRAVENOUS

## 2017-10-01 MED ORDER — POTASSIUM CHLORIDE 10 MEQ/50ML IV SOLN: 10.0000 meq | INTRAVENOUS | Status: AC

## 2017-10-01 MED ORDER — VANCOMYCIN HCL IN DEXTROSE 1-5 GM/200ML-% IV SOLN
1000.0000 mg | Freq: Once | INTRAVENOUS | Status: AC
  Administered 2017-10-01: 1000 mg via INTRAVENOUS
  Filled 2017-10-01: qty 200

## 2017-10-01 MED ORDER — PANTOPRAZOLE SODIUM 40 MG PO TBEC
40.0000 mg | DELAYED_RELEASE_TABLET | Freq: Every day | ORAL | Status: DC
  Administered 2017-10-02 – 2017-10-05 (×4): 40 mg via ORAL
  Filled 2017-10-01 (×4): qty 1

## 2017-10-01 MED ORDER — 0.9 % SODIUM CHLORIDE (POUR BTL) OPTIME
TOPICAL | Status: DC | PRN
  Administered 2017-10-01: 6000 mL

## 2017-10-01 MED ORDER — SODIUM CHLORIDE 0.45 % IV SOLN
INTRAVENOUS | Status: DC | PRN
  Administered 2017-10-01: 20 mL/h via INTRAVENOUS

## 2017-10-01 MED ORDER — LACTATED RINGERS IV SOLN: 500.0000 mL | Freq: Once | INTRAVENOUS | Status: DC | PRN

## 2017-10-01 MED ORDER — ARTIFICIAL TEARS OPHTHALMIC OINT
TOPICAL_OINTMENT | OPHTHALMIC | Status: AC
  Filled 2017-10-01: qty 3.5

## 2017-10-01 MED ORDER — SODIUM CHLORIDE 0.9 % IV SOLN: 250.0000 mL | INTRAVENOUS | Status: DC

## 2017-10-01 MED ORDER — LACTATED RINGERS IV SOLN
INTRAVENOUS | Status: DC | PRN
  Administered 2017-10-01: 07:00:00 via INTRAVENOUS

## 2017-10-01 MED ORDER — TRAMADOL HCL 50 MG PO TABS
50.0000 mg | ORAL_TABLET | ORAL | Status: DC | PRN
  Administered 2017-10-02 (×3): 100 mg via ORAL
  Filled 2017-10-01 (×3): qty 2

## 2017-10-01 MED ORDER — NITROGLYCERIN IN D5W 200-5 MCG/ML-% IV SOLN: 0.0000 ug/min | INTRAVENOUS | Status: DC

## 2017-10-01 MED ORDER — ALBUMIN HUMAN 5 % IV SOLN
250.0000 mL | INTRAVENOUS | Status: AC | PRN
  Administered 2017-10-01: 250 mL via INTRAVENOUS

## 2017-10-01 MED ORDER — METOPROLOL TARTRATE 12.5 MG HALF TABLET
ORAL_TABLET | ORAL | Status: AC
  Filled 2017-10-01: qty 1

## 2017-10-01 MED ORDER — HEPARIN SODIUM (PORCINE) 1000 UNIT/ML IJ SOLN
INTRAMUSCULAR | Status: DC | PRN
  Administered 2017-10-01: 32000 [IU] via INTRAVENOUS

## 2017-10-01 MED ORDER — DIPHENHYDRAMINE HCL 50 MG/ML IJ SOLN
INTRAMUSCULAR | Status: AC
  Filled 2017-10-01: qty 1

## 2017-10-01 MED ORDER — ACETAMINOPHEN 160 MG/5ML PO SOLN: 1000.0000 mg | Freq: Four times a day (QID) | ORAL | Status: DC

## 2017-10-01 MED ORDER — PHENYLEPHRINE 40 MCG/ML (10ML) SYRINGE FOR IV PUSH (FOR BLOOD PRESSURE SUPPORT)
PREFILLED_SYRINGE | INTRAVENOUS | Status: DC | PRN
  Administered 2017-10-01: 40 ug via INTRAVENOUS
  Administered 2017-10-01: 80 ug via INTRAVENOUS
  Administered 2017-10-01: 40 ug via INTRAVENOUS
  Administered 2017-10-01 (×2): 80 ug via INTRAVENOUS

## 2017-10-01 MED ORDER — PHENYLEPHRINE HCL-NACL 20-0.9 MG/250ML-% IV SOLN
0.0000 ug/min | INTRAVENOUS | Status: DC
  Filled 2017-10-01: qty 250

## 2017-10-01 MED ORDER — FENTANYL CITRATE (PF) 250 MCG/5ML IJ SOLN
INTRAMUSCULAR | Status: DC | PRN
  Administered 2017-10-01: 250 ug via INTRAVENOUS
  Administered 2017-10-01: 50 ug via INTRAVENOUS
  Administered 2017-10-01: 150 ug via INTRAVENOUS
  Administered 2017-10-01: 200 ug via INTRAVENOUS
  Administered 2017-10-01 (×2): 100 ug via INTRAVENOUS
  Administered 2017-10-01: 50 ug via INTRAVENOUS
  Administered 2017-10-01 (×3): 100 ug via INTRAVENOUS
  Administered 2017-10-01: 150 ug via INTRAVENOUS

## 2017-10-01 MED ORDER — LIDOCAINE 2% (20 MG/ML) 5 ML SYRINGE
INTRAMUSCULAR | Status: AC
  Filled 2017-10-01: qty 5

## 2017-10-01 SURGICAL SUPPLY — 113 items
ADAPTER CARDIO PERF ANTE/RETRO (ADAPTER) ×3 IMPLANT
ADPR PRFSN 84XANTGRD RTRGD (ADAPTER) ×2
BAG DECANTER FOR FLEXI CONT (MISCELLANEOUS) ×3 IMPLANT
BANDAGE ACE 4X5 VEL STRL LF (GAUZE/BANDAGES/DRESSINGS) ×3 IMPLANT
BANDAGE ACE 6X5 VEL STRL LF (GAUZE/BANDAGES/DRESSINGS) ×3 IMPLANT
BASKET HEART (ORDER IN 25'S) (MISCELLANEOUS) ×1
BASKET HEART (ORDER IN 25S) (MISCELLANEOUS) ×2 IMPLANT
BLADE STERNUM SYSTEM 6 (BLADE) ×3 IMPLANT
BLADE SURG 15 STRL LF DISP TIS (BLADE) ×2 IMPLANT
BLADE SURG 15 STRL SS (BLADE) ×3
BNDG GAUZE ELAST 4 BULKY (GAUZE/BANDAGES/DRESSINGS) ×3 IMPLANT
CANISTER SUCT 3000ML PPV (MISCELLANEOUS) ×3 IMPLANT
CANNULA GUNDRY RCSP 15FR (MISCELLANEOUS) ×3 IMPLANT
CANNULA SUMP PERICARDIAL (CANNULA) ×1 IMPLANT
CATH HEART VENT LEFT (CATHETERS) ×2 IMPLANT
CATH ROBINSON RED A/P 18FR (CATHETERS) ×9 IMPLANT
CATH THORACIC 28FR (CATHETERS) ×3 IMPLANT
CATH THORACIC 36FR (CATHETERS) ×3 IMPLANT
CATH THORACIC 36FR RT ANG (CATHETERS) ×3 IMPLANT
CLIP VESOCCLUDE MED 24/CT (CLIP) IMPLANT
CLIP VESOCCLUDE SM WIDE 24/CT (CLIP) IMPLANT
CONT SPEC 4OZ CLIKSEAL STRL BL (MISCELLANEOUS) ×4 IMPLANT
COVER SURGICAL LIGHT HANDLE (MISCELLANEOUS) ×3 IMPLANT
CRADLE DONUT ADULT HEAD (MISCELLANEOUS) ×3 IMPLANT
DRAPE CARDIOVASCULAR INCISE (DRAPES) ×3
DRAPE SLUSH/WARMER DISC (DRAPES) ×3 IMPLANT
DRAPE SRG 135X102X78XABS (DRAPES) ×2 IMPLANT
DRSG COVADERM 4X14 (GAUZE/BANDAGES/DRESSINGS) ×3 IMPLANT
ELECT CAUTERY BLADE 6.4 (BLADE) ×3 IMPLANT
ELECT REM PT RETURN 9FT ADLT (ELECTROSURGICAL) ×6
ELECTRODE REM PT RTRN 9FT ADLT (ELECTROSURGICAL) ×4 IMPLANT
FELT TEFLON 1X6 (MISCELLANEOUS) ×6 IMPLANT
FLOSEAL 5ML (HEMOSTASIS) ×1 IMPLANT
GAUZE SPONGE 4X4 12PLY STRL (GAUZE/BANDAGES/DRESSINGS) ×6 IMPLANT
GLOVE BIO SURGEON STRL SZ 6 (GLOVE) IMPLANT
GLOVE BIO SURGEON STRL SZ 6.5 (GLOVE) ×5 IMPLANT
GLOVE BIO SURGEON STRL SZ7 (GLOVE) IMPLANT
GLOVE BIO SURGEON STRL SZ7.5 (GLOVE) ×2 IMPLANT
GLOVE BIOGEL PI IND STRL 6 (GLOVE) IMPLANT
GLOVE BIOGEL PI IND STRL 6.5 (GLOVE) IMPLANT
GLOVE BIOGEL PI IND STRL 7.0 (GLOVE) IMPLANT
GLOVE BIOGEL PI INDICATOR 6 (GLOVE)
GLOVE BIOGEL PI INDICATOR 6.5 (GLOVE) ×3
GLOVE BIOGEL PI INDICATOR 7.0 (GLOVE)
GLOVE EUDERMIC 7 POWDERFREE (GLOVE) ×6 IMPLANT
GLOVE ORTHO TXT STRL SZ7.5 (GLOVE) IMPLANT
GOWN STRL REUS W/ TWL LRG LVL3 (GOWN DISPOSABLE) ×8 IMPLANT
GOWN STRL REUS W/ TWL XL LVL3 (GOWN DISPOSABLE) ×2 IMPLANT
GOWN STRL REUS W/TWL LRG LVL3 (GOWN DISPOSABLE) ×24
GOWN STRL REUS W/TWL XL LVL3 (GOWN DISPOSABLE) ×3
HEMOSTAT POWDER SURGIFOAM 1G (HEMOSTASIS) ×9 IMPLANT
HEMOSTAT SURGICEL 2X14 (HEMOSTASIS) ×3 IMPLANT
INSERT FOGARTY 61MM (MISCELLANEOUS) IMPLANT
INSERT FOGARTY XLG (MISCELLANEOUS) IMPLANT
KIT BASIN OR (CUSTOM PROCEDURE TRAY) ×3 IMPLANT
KIT CATH CPB BARTLE (MISCELLANEOUS) ×3 IMPLANT
KIT SUCTION CATH 14FR (SUCTIONS) ×3 IMPLANT
KIT TURNOVER KIT B (KITS) ×3 IMPLANT
KIT VASOVIEW HEMOPRO VH 3000 (KITS) ×3 IMPLANT
LINE VENT (MISCELLANEOUS) ×1 IMPLANT
NS IRRIG 1000ML POUR BTL (IV SOLUTION) ×18 IMPLANT
PACK E OPEN HEART (SUTURE) ×3 IMPLANT
PACK OPEN HEART (CUSTOM PROCEDURE TRAY) ×3 IMPLANT
PAD ARMBOARD 7.5X6 YLW CONV (MISCELLANEOUS) ×6 IMPLANT
PAD ELECT DEFIB RADIOL ZOLL (MISCELLANEOUS) ×3 IMPLANT
PENCIL BUTTON HOLSTER BLD 10FT (ELECTRODE) ×3 IMPLANT
PUNCH AORTIC ROTATE 4.0MM (MISCELLANEOUS) IMPLANT
PUNCH AORTIC ROTATE 4.5MM 8IN (MISCELLANEOUS) ×3 IMPLANT
PUNCH AORTIC ROTATE 5MM 8IN (MISCELLANEOUS) IMPLANT
SENSOR MYOCARDIAL TEMP (MISCELLANEOUS) ×1 IMPLANT
SET CARDIOPLEGIA MPS 5001102 (MISCELLANEOUS) ×1 IMPLANT
SPONGE INTESTINAL PEANUT (DISPOSABLE) IMPLANT
SPONGE LAP 18X18 X RAY DECT (DISPOSABLE) ×1 IMPLANT
SPONGE LAP 4X18 RFD (DISPOSABLE) ×4 IMPLANT
SUT BONE WAX W31G (SUTURE) ×3 IMPLANT
SUT ETHIBON 2 0 V 52N 30 (SUTURE) ×6 IMPLANT
SUT MNCRL AB 4-0 PS2 18 (SUTURE) IMPLANT
SUT PROLENE 3 0 SH DA (SUTURE) IMPLANT
SUT PROLENE 3 0 SH1 36 (SUTURE) ×3 IMPLANT
SUT PROLENE 4 0 RB 1 (SUTURE) ×15
SUT PROLENE 4 0 SH DA (SUTURE) IMPLANT
SUT PROLENE 4-0 RB1 .5 CRCL 36 (SUTURE) ×6 IMPLANT
SUT PROLENE 5 0 C 1 36 (SUTURE) IMPLANT
SUT PROLENE 6 0 C 1 30 (SUTURE) IMPLANT
SUT PROLENE 7 0 BV 1 (SUTURE) IMPLANT
SUT PROLENE 7 0 BV1 MDA (SUTURE) ×3 IMPLANT
SUT PROLENE 8 0 BV175 6 (SUTURE) IMPLANT
SUT SILK  1 MH (SUTURE)
SUT SILK 1 MH (SUTURE) IMPLANT
SUT SILK 2 0 SH CR/8 (SUTURE) ×1 IMPLANT
SUT STEEL 6MS V (SUTURE) IMPLANT
SUT STEEL STERNAL CCS#1 18IN (SUTURE) IMPLANT
SUT STEEL SZ 6 DBL 3X14 BALL (SUTURE) IMPLANT
SUT VIC AB 1 CTX 36 (SUTURE) ×6
SUT VIC AB 1 CTX36XBRD ANBCTR (SUTURE) ×4 IMPLANT
SUT VIC AB 2-0 CT1 27 (SUTURE) ×3
SUT VIC AB 2-0 CT1 TAPERPNT 27 (SUTURE) IMPLANT
SUT VIC AB 2-0 CTX 27 (SUTURE) ×1 IMPLANT
SUT VIC AB 3-0 SH 27 (SUTURE)
SUT VIC AB 3-0 SH 27X BRD (SUTURE) IMPLANT
SUT VIC AB 3-0 X1 27 (SUTURE) ×1 IMPLANT
SUT VICRYL 4-0 PS2 18IN ABS (SUTURE) IMPLANT
SYSTEM SAHARA CHEST DRAIN ATS (WOUND CARE) ×3 IMPLANT
TAPE CLOTH SURG 4X10 WHT LF (GAUZE/BANDAGES/DRESSINGS) ×1 IMPLANT
TAPE PAPER 2X10 WHT MICROPORE (GAUZE/BANDAGES/DRESSINGS) ×1 IMPLANT
TOWEL GREEN STERILE (TOWEL DISPOSABLE) ×3 IMPLANT
TOWEL GREEN STERILE FF (TOWEL DISPOSABLE) ×3 IMPLANT
TRAY FOLEY SLVR 16FR TEMP STAT (SET/KITS/TRAYS/PACK) ×3 IMPLANT
TUBING INSUFFLATION (TUBING) ×3 IMPLANT
UNDERPAD 30X30 (UNDERPADS AND DIAPERS) ×3 IMPLANT
VALVE AORTIC SZ25 INSP/RESIL (Prosthesis & Implant Heart) ×1 IMPLANT
VENT LEFT HEART 12002 (CATHETERS) ×3
WATER STERILE IRR 1000ML POUR (IV SOLUTION) ×6 IMPLANT

## 2017-10-01 NOTE — Op Note (Signed)
CARDIOVASCULAR SURGERY OPERATIVE NOTE  10/01/2017  Surgeon:  Gaye Pollack, MD  First Assistant: Jadene Pierini,  PA-C   Preoperative Diagnosis:  Severe two-vessel coronary artery disease, Moderate aortic stenosis and regurgitation.   Postoperative Diagnosis:  Same   Procedure:  1. Median Sternotomy 2. Extracorporeal circulation 3.   Coronary artery bypass grafting x 2   Left internal mammary graft to the Diagonal  SVG to OM   4.   Endoscopic vein harvest from the right leg 5.   Aortic valve replacement using a 25 mm Edwards INSPIRIS RESILIA pericardial valve.   Anesthesia:  General Endotracheal   Clinical History/Surgical Indication:  The patient is a 67 year old gentleman with a history of hypertension, hyperlipidemia, hypothyroidism, prostate cancer status post robot-assisted laparoscopic radical prostatectomy in 10/2013 with a subsequent rise in his PSA and radiation therapy in 2016, and known aortic stenosis. He says that he had a stress echocardiogram in [place] in May 2014 which was normal. He was having some exertional shortness of breath and fatigue at that time and says he was told that he has some aortic stenosis but it was not enough to warrant surgery. He says he is continued to have dyspnea on exertion since then which is recently become a little worse. He feels like it is limiting his ability to be as active as he wants to be. He can walk on level ground but on inclines or going up stairs he has to take a break. He denies any chest pain or pressure. He has occasional dizziness particularly when he gets up from a sitting or laying down position. He has had some lower extremity edema. A 2D echocardiogram on 09/22/2016 showed mild aortic stenosis with a mean gradient of 22 mmHg and a dimensionless index of 0.42. There was moderate aortic insufficiency. Left ventricular ejection  fraction was normal at 55 to 60% with grade 1 diastolic dysfunction. His most recent echocardiogram on 08/09/2017 showed a rise in his mean gradient to 30 mmHg with a peak gradient of 54 mmHg. The dimensionless index was 0.42. There was still a moderate aortic insufficiency. Left ventricular ejection fraction remains normal at 60 to 65%. The left ventricular internal dimensions remain within normal limits. He underwent cardiac catheterization on 08/23/2017. This showed severe two-vessel coronary disease. There is a 95% ostial stenosis of a first diagonal branch which is a moderate-sized vessel. There is also a 75% mid to distal left circumflex stenosis before a large marginal branch which looked hazy. The LAD had no significant stenosis. The right coronary artery had no significant stenosis. The mean aortic valve gradient was 29 mmHg with the LVEDP measured at 23 mmHg. Right heart pressures were normal. There is also question of an enlarged aorta by CTA of the chest in 2017 when it was measured at 4.2 x 4.0 cm. A CT scan of the chest in September 2018 measured the aorta at 4 cm. A repeat CTA of the chest on 08/30/2017 shows a maximum diameter at the sinus level at 3.9 cm and the ascending aorta measuring 3.8 to 3.9 cm in greatest diameter. Radiology felt that the previous measurements of 4.0 and 4.2 cm were due to measurements that were not perpendicular to the long axis of the aorta.   He has moderate aortic stenosis and moderate aortic insufficiency with severe two-vessel coronary disease with progressive symptoms of exertional fatigue and shortness of breath which are limiting his lifestyle. I have personally reviewed his echocardiogram, cardiac catheterization, and CTA studies.  His echocardiogram shows a functionally bicuspid aortic valve with severely calcified leaflets and restricted mobility with a mean gradient of 30 mmHg consistent with moderate aortic stenosis as well as moderate aortic insufficiency. His  left ventricular systolic function is normal with normal internal dimensions. Cardiac catheterization showed severe two-vessel coronary disease as noted above with high-grade stenoses in the diagonal branch and a large obtuse marginal. His LVEDP was elevated at 23 mmHg consistent with fluid overload. I suspect that his symptoms are probably related to a combination of his moderate aortic stenosis and insufficiency as well as coronary disease. He feels like this is significantly limiting his ability to remain active and would like to have it repaired. I have recommended a bioprosthetic valve given his age of 34. Given his symptoms I think it is reasonable to proceed with aortic valve replacement and coronary bypass graft surgery. I discussed the operative procedure with the patient and his daughter including alternatives, benefits and risks; including but not limited to bleeding, blood transfusion, infection, stroke, myocardial infarction, graft failure, heart block requiring a permanent pacemaker, organ dysfunction, and death. [name] understands and agrees to proceed.    Preparation:  The patient was seen in the preoperative holding area and the correct patient, correct operation were confirmed with the patient after reviewing the medical record and catheterization. The consent was signed by me. Preoperative antibiotics were given. A pulmonary arterial line and radial arterial line were placed by the anesthesia team. The patient was taken back to the operating room and positioned supine on the operating room table. After being placed under general endotracheal anesthesia by the anesthesia team a foley catheter was placed. The neck, chest, abdomen, and both legs were prepped with betadine soap and solution and draped in the usual sterile manner. A surgical time-out was taken and the correct patient and operative procedure were confirmed with the nursing and anesthesia staff.  TEE:   GALDINO HINCHMAN   ECHO TEE (OR) W 3D  Order# [id number]  Reading physician: Audry Pili, MD Ordering physician: Gaye Pollack, MD Study date: 10/01/17  Conclusions   Result status: Final result   Aortic valve: The valve is bicuspid. Moderately decreased leaflet separation. Moderate stenosis. Moderate regurgitation.  Mitral valve: Mild regurgitation.  Right ventricle: Normal cavity size, wall thickness and ejection fraction.  Tricuspid valve: Trace regurgitation.    Cardiopulmonary Bypass:  A median sternotomy was performed. The pericardium was opened in the midline. Right ventricular function appeared normal. The ascending aorta was mildly enlarged and had no palpable plaque. There were no contraindications to aortic cannulation or cross-clamping. The patient was fully systemically heparinized and the ACT was maintained > 400 sec. The proximal aortic arch was cannulated with a 20 F aortic cannula for arterial inflow. Venous cannulation was performed via the right atrial appendage using a two-staged venous cannula. An antegrade cardioplegia/vent cannula was inserted into the mid-ascending aorta. A left ventricular vent was placed via the right superior pulmonary vein. A retrograde cardioplegia cannula was placed through the right atrium into the coronary sinus. Aortic occlusion was performed with a single cross-clamp. Systemic cooling to 32 degrees Centigrade and topical cooling of the heart with iced saline were used. Hyperkalemic antegrade and retrograde cold blood cardioplegia was used to induce diastolic arrest and was then cold blood retrograde cardioplegia was given at about 20 minute intervals throughout the period of arrest to maintain myocardial temperature at or below 10 degrees centigrade. A temperature probe was inserted  into the interventricular septum and an insulating pad was placed in the pericardium.   Left internal mammary harvest:  The left side of the sternum was retracted using the  Rultract retractor. The left internal mammary artery was harvested as a pedicle graft. All side branches were clipped. It was a medium-sized vessel of good quality with excellent blood flow. It was ligated distally and divided. It was sprayed with topical papaverine solution to prevent vasospasm.   Endoscopic vein harvest:  The right greater saphenous vein was harvested endoscopically through a 2 cm incision medial to the right knee. It was harvested from the thigh. It was a medium to large-sized vein of good quality. The side branches were all ligated with 4-0 silk ties.    Coronary arteries:  The coronary arteries were examined.   LAD:  Mild diffuse plaque. The diagonal was large and had no distal disease beyond the high grade proximal stenosis.  LCX:  Large OM with diffuse segmental plaque extending into the distal vessel.  RCA:  Diffuse palpable plaque.   Grafts:  1. LIMA to the diagonal: 1.75 mm. It was sewn end to side using 8-0 prolene continuous suture. 2. SVG to OM:  3.0 mm. It was sewn end to side using 7-0 prolene continuous suture.    The proximal vein graft anastomosis was performed to the mid-ascending aorta using continuous 6-0 prolene suture. A graft marker was placed around the proximal anastomosis.   Aortic Valve Replacement:     A transverse aortotomy was performed 1 cm above the take-off of the right coronary artery. The native valve was a Sievers type 1 bicuspid valve with fusion of the left and right cusps with a single raphe between the two. There were calcified leaflets that had decreased mobility and did not coapt and mild annular calcification. The ostia of the coronary arteries were in normal position and were not obstructed. The native valve leaflets were excised and the annulus was decalcified with rongeurs. Care was taken to remove all particulate debris. The left ventricle was directly inspected for debris and then irrigated with ice saline solution. The  annulus was sized and a size 25 mm Edwards INSPIRIS RESILIA pericardial valve was chosen. The model number was [id number] and the serial number was [id number]. His BSA was 2.11. While the valve was being prepared 2-0 Ethibond pledgeted horizontal mattress sutures were placed around the annulus with the pledgets in a sub-annular position. The sutures were placed through the sewing ring and the valve lowered into place. The sutures were tied sequentially. The valve seated nicely and the coronary ostia were not obstructed. The prosthetic valve leaflets moved normally and there was no sub-valvular obstruction. The aortotomy was closed using 4-0 Prolene suture in 2 layers with felt strips to reinforce the closure.  Completion:  The patient was rewarmed to 37 degrees Centigrade. The clamp was removed from the LIMA pedicle. The crossclamp was removed with a time of 106 minutes. There was spontaneous return of sinus rhythm. The distal and proximal anastomoses were checked for hemostasis. The position of the grafts was satisfactory. Two temporary epicardial pacing wires were placed on the right atrium and two on the right ventricle. The patient was weaned from CPB without difficulty on no inotropes. CPB time was 131 minutes. Cardiac output was 6 LPM. TEE showed normal LV function. The aortic valve prosthesis was functioning normally with no paravalvular leak.  Heparin was fully reversed with protamine and the aortic and venous cannulas removed. Hemostasis  was achieved. Mediastinal and left pleural drainage tubes were placed. The sternum was closed with double #6 stainless steel wires. The fascia was closed with continuous # 1 vicryl suture. The subcutaneous tissue was closed with 2-0 vicryl continuous suture. The skin was closed with 3-0 vicryl subcuticular suture. All sponge, needle, and instrument counts were reported correct at the end of the case. Dry sterile dressings were placed over the incisions and around the chest  tubes which were connected to pleurevac suction. The patient was then transported to the surgical intensive care unit in  stable condition.

## 2017-10-01 NOTE — Progress Notes (Signed)
      [address]       [place] [address]             [phone]      S/p AVR, CABG x 2 Extubated BP 94/74   Pulse 80   Temp 98.8 F (37.1 C)   Resp 16   Ht 5\' 11"  (1.803 m)   Wt 90.7 kg   SpO2 99%   BMI 27.89 kg/m   Intake/Output Summary (Last 24 hours) at 10/01/2017 1817 Last data filed at 10/01/2017 1700 Gross per 24 hour  Intake 4785.91 ml  Output 4015 ml  Net 770.91 ml  Co= 4.6, CI= 2.2 Doing well early postop  Remo Lipps C. Roxan Hockey, MD [facility] [phone]

## 2017-10-01 NOTE — Progress Notes (Signed)
Pt weaning per SICU protocol

## 2017-10-01 NOTE — Transfer of Care (Signed)
Immediate Anesthesia Transfer of Care Note  Patient: Danny Odom  Procedure(s) Performed: CORONARY ARTERY BYPASS GRAFTING (CABG) x two, using left internal mammary artery and right leg greater saphenous vein harvested endoscopically (N/A Chest) AORTIC VALVE REPLACEMENT (AVR) (N/A Chest) TRANSESOPHAGEAL ECHOCARDIOGRAM (TEE) (N/A )  Patient Location: ICU  Anesthesia Type:General  Level of Consciousness: sedated and Patient remains intubated per anesthesia plan  Airway & Oxygen Therapy: Patient remains intubated per anesthesia plan and Patient placed on Ventilator (see vital sign flow sheet for setting)  Post-op Assessment: Report given to RN and Post -op Vital signs reviewed and stable  Post vital signs: Reviewed and stable  Last Vitals:  Vitals Value Taken Time  BP 87/53 10/01/2017  1:30 PM  Temp 35.4 C 10/01/2017  1:40 PM  Pulse 80 10/01/2017  1:40 PM  Resp 15 10/01/2017  1:40 PM  SpO2 94 % 10/01/2017  1:40 PM  Vitals shown include unvalidated device data.  Last Pain:  Vitals:   10/01/17 0612  TempSrc:   PainSc: 0-No pain      Patients Stated Pain Goal: 4 (32/20/25 4270)  Complications: No apparent anesthesia complications

## 2017-10-01 NOTE — Anesthesia Procedure Notes (Signed)
Central Venous Catheter Insertion Performed by: Audry Pili, MD, anesthesiologist Start/End8/23/2019 7:13 AM, 10/01/2017 7:17 AM Patient location: Pre-op. Preanesthetic checklist: patient identified, IV checked, risks and benefits discussed, surgical consent, monitors and equipment checked, pre-op evaluation, timeout performed and anesthesia consent Position: Trendelenburg Hand hygiene performed  and maximum sterile barriers used  Total catheter length 10. PA cath was placed.Swan type:thermodilution PA Cath depth:52 Procedure performed without using ultrasound guided technique. Attempts: 1 Patient tolerated the procedure well with no immediate complications.

## 2017-10-01 NOTE — Procedures (Signed)
Extubation Procedure Note  Patient Details:   Name: Danny Odom DOB: Jan 18, 1951 MRN: [id number]   Airway Documentation:   NIF -30, VC 1100 ml   Vent end date: 10/01/17 Vent end time: 1725   Evaluation  O2 sats: stable throughout Complications: No apparent complications Patient did tolerate procedure well. Bilateral Breath Sounds: Clear, Diminished   Yes pt able to speak, no distress noted, no stridor noted.  VSS  IS 1000-1250 ml  Lenna Sciara 10/01/2017, 5:37 PM

## 2017-10-01 NOTE — Anesthesia Procedure Notes (Signed)
Arterial Line Insertion Start/End8/23/2019 6:40 AM, 10/01/2017 6:50 AM Performed by: Verdie Drown, CRNA, CRNA  Patient location: Pre-op. Preanesthetic checklist: patient identified, IV checked, site marked, risks and benefits discussed, surgical consent, monitors and equipment checked, pre-op evaluation, timeout performed and anesthesia consent Lidocaine 1% used for infiltration radial was placed Catheter size: 20 Fr Hand hygiene performed  and maximum sterile barriers used   Attempts: 1 Procedure performed without using ultrasound guided technique. Following insertion, dressing applied. Post procedure assessment: normal and unchanged  Additional procedure comments: Performed by Christena Flake.

## 2017-10-01 NOTE — Interval H&P Note (Signed)
History and Physical Interval Note:  10/01/2017 6:57 AM  Danny Odom  has presented today for surgery, with the diagnosis of AS CAD  The various methods of treatment have been discussed with the patient and family. After consideration of risks, benefits and other options for treatment, the patient has consented to  Procedure(s): CORONARY ARTERY BYPASS GRAFTING (CABG) (N/A) AORTIC VALVE REPLACEMENT (AVR) (N/A) TRANSESOPHAGEAL ECHOCARDIOGRAM (TEE) (N/A) as a surgical intervention .  The patient's history has been reviewed, patient examined, no change in status, stable for surgery.  I have reviewed the patient's chart and labs.  Questions were answered to the patient's satisfaction.     Gaye Pollack

## 2017-10-01 NOTE — OR Nursing (Signed)
12:15 - 45 minute call to SICU charge nurse 

## 2017-10-01 NOTE — Anesthesia Procedure Notes (Signed)
Central Venous Catheter Insertion Performed by: Audry Pili, MD, anesthesiologist Start/End8/23/2019 7:01 AM, 10/01/2017 7:17 AM Patient location: Pre-op. Preanesthetic checklist: patient identified, IV checked, risks and benefits discussed, surgical consent, monitors and equipment checked, pre-op evaluation, timeout performed and anesthesia consent Position: Trendelenburg Lidocaine 1% used for infiltration and patient sedated Hand hygiene performed , maximum sterile barriers used  and Seldinger technique used Catheter size: 8.5 Fr Central line was placed.MAC introducer Procedure performed using ultrasound guided technique. Ultrasound Notes:anatomy identified, needle tip was noted to be adjacent to the nerve/plexus identified, no ultrasound evidence of intravascular and/or intraneural injection and image(s) printed for medical record Attempts: 1 Following insertion, line sutured, dressing applied and Biopatch. Post procedure assessment: blood return through all ports, no air and free fluid flow  Patient tolerated the procedure well with no immediate complications.

## 2017-10-01 NOTE — Brief Op Note (Signed)
10/01/2017  11:05 AM  PATIENT:  Danny Odom  67 y.o. male  PRE-OPERATIVE DIAGNOSIS:  AS CAD  POST-OPERATIVE DIAGNOSIS:  AS CAD  PROCEDURE:  Procedure(s): CORONARY ARTERY BYPASS GRAFTING (CABG) x two, using left internal mammary artery and right leg greater saphenous vein harvested endoscopically (N/A) AORTIC VALVE REPLACEMENT (AVR) (N/A) TRANSESOPHAGEAL ECHOCARDIOGRAM (TEE) (N/A) LIMA-DIAG SVG-OM # 25 RESILIA INSPIRIS  AVR  SURGEON:  Surgeon(s) and Role:    * Bartle, Fernande Boyden, MD - Primary  PHYSICIAN ASSISTANT: Marigene Erler PA-C  ANESTHESIA:   general  EBL:  1300 mL   BLOOD ADMINISTERED:none  DRAINS: PLEURAL AND PERICARDIAL CHEST DRAINS   LOCAL MEDICATIONS USED:  NONE  SPECIMEN:  Source of Specimen:  AORTIC VALVE LEAFLETS  DISPOSITION OF SPECIMEN:  PATHOLOGY  COUNTS:  YES  TOURNIQUET:  * No tourniquets in log *  DICTATION: .Dragon Dictation  PLAN OF CARE: Admit to inpatient   PATIENT DISPOSITION:  ICU - intubated and hemodynamically stable.   Delay start of Pharmacological VTE agent (>24hrs) due to surgical blood loss or risk of bleeding: yes  COMPLICATIONS: NO KNOWN

## 2017-10-01 NOTE — Progress Notes (Signed)
  Echocardiogram Echocardiogram Transesophageal has been performed.  Danny Odom 10/01/2017, 9:22 AM

## 2017-10-01 NOTE — Anesthesia Postprocedure Evaluation (Signed)
Anesthesia Post Note  Patient: ELRAY DAINS  Procedure(s) Performed: CORONARY ARTERY BYPASS GRAFTING (CABG) x two, using left internal mammary artery and right leg greater saphenous vein harvested endoscopically (N/A Chest) AORTIC VALVE REPLACEMENT (AVR) (N/A Chest) TRANSESOPHAGEAL ECHOCARDIOGRAM (TEE) (N/A )     Patient location during evaluation: ICU Anesthesia Type: General Level of consciousness: awake and alert Pain management: pain level controlled Vital Signs Assessment: post-procedure vital signs reviewed and stable Respiratory status: spontaneous breathing, nonlabored ventilation, respiratory function stable and patient connected to nasal cannula oxygen Cardiovascular status: stable Postop Assessment: no apparent nausea or vomiting Anesthetic complications: no Comments: Extubated POD#0, doing well.    Last Vitals:  Vitals:   10/01/17 2000 10/01/17 2100  BP: 113/79 110/76  Pulse: 80 79  Resp: 18 19  Temp: 37.5 C 37.5 C  SpO2: 96% 94%    Last Pain:  Vitals:   10/01/17 2000  TempSrc: Core  PainSc: 3    Pain Goal: Patients Stated Pain Goal: 3 (10/01/17 2000)               Audry Pili

## 2017-10-01 NOTE — Anesthesia Procedure Notes (Addendum)
Procedure Name: Intubation Date/Time: 10/01/2017 7:41 AM Performed by: Verdie Drown, CRNA Pre-anesthesia Checklist: Patient identified, Emergency Drugs available, Suction available and Patient being monitored Patient Re-evaluated:Patient Re-evaluated prior to induction Oxygen Delivery Method: Circle System Utilized Preoxygenation: Pre-oxygenation with 100% oxygen Induction Type: IV induction Ventilation: Mask ventilation without difficulty Tube type: Oral Tube size: 8.0 mm Number of attempts: 1 Airway Equipment and Method: Stylet and Oral airway Placement Confirmation: ETT inserted through vocal cords under direct vision,  positive ETCO2 and breath sounds checked- equal and bilateral Secured at: 23 cm Tube secured with: Tape Dental Injury: Teeth and Oropharynx as per pre-operative assessment  Comments: Placed by Christena Flake

## 2017-10-02 ENCOUNTER — Inpatient Hospital Stay (HOSPITAL_COMMUNITY): Payer: BLUE CROSS/BLUE SHIELD

## 2017-10-02 ENCOUNTER — Other Ambulatory Visit: Payer: Self-pay

## 2017-10-02 LAB — BASIC METABOLIC PANEL
ANION GAP: 6 (ref 5–15)
BUN: 14 mg/dL (ref 8–23)
CALCIUM: 7.7 mg/dL — AB (ref 8.9–10.3)
CHLORIDE: 105 mmol/L (ref 98–111)
CO2: 25 mmol/L (ref 22–32)
CREATININE: 1.07 mg/dL (ref 0.61–1.24)
GFR calc non Af Amer: >60 mL/min (ref >60)
GLUCOSE: 116 mg/dL — AB (ref 70–99)
Potassium: 4 mmol/L (ref 3.5–5.1)
Sodium: 136 mmol/L (ref 135–145)

## 2017-10-02 LAB — CBC
HCT: 30.4 % — ABNORMAL LOW (ref 39.0–52.0)
HEMATOCRIT: 29.2 % — AB (ref 39.0–52.0)
HEMOGLOBIN: 9.4 g/dL — AB (ref 13.0–17.0)
Hemoglobin: 9.5 g/dL — ABNORMAL LOW (ref 13.0–17.0)
MCH: 31.3 pg (ref 26.0–34.0)
MCH: 31.3 pg (ref 26.0–34.0)
MCHC: 32.2 g/dL (ref 30.0–36.0)
MCHC: 31.3 g/dL (ref 30.0–36.0)
MCV: 97.3 fL (ref 78.0–100.0)
MCV: 100 fL (ref 78.0–100.0)
PLATELETS: 136 10*3/uL — AB (ref 150–400)
Platelets: 115 10*3/uL — ABNORMAL LOW (ref 150–400)
RBC: 3 MIL/uL — ABNORMAL LOW (ref 4.22–5.81)
RBC: 3.04 MIL/uL — ABNORMAL LOW (ref 4.22–5.81)
RDW: 13.4 % (ref 11.5–15.5)
RDW: 13.8 % (ref 11.5–15.5)
WBC: 11.7 10*3/uL — ABNORMAL HIGH (ref 4.0–10.5)
WBC: 16.1 10*3/uL — AB (ref 4.0–10.5)

## 2017-10-02 LAB — GLUCOSE, CAPILLARY
GLUCOSE-CAPILLARY: 113 mg/dL — AB (ref 70–99)
GLUCOSE-CAPILLARY: 116 mg/dL — AB (ref 70–99)
GLUCOSE-CAPILLARY: 126 mg/dL — AB (ref 70–99)
GLUCOSE-CAPILLARY: 117 mg/dL — AB (ref 70–99)
GLUCOSE-CAPILLARY: 104 mg/dL — AB (ref 70–99)
GLUCOSE-CAPILLARY: 126 mg/dL — AB (ref 70–99)
Glucose-Capillary: 122 mg/dL — ABNORMAL HIGH (ref 70–99)
Glucose-Capillary: 125 mg/dL — ABNORMAL HIGH (ref 70–99)
Glucose-Capillary: 114 mg/dL — ABNORMAL HIGH (ref 70–99)
Glucose-Capillary: 114 mg/dL — ABNORMAL HIGH (ref 70–99)
Glucose-Capillary: 118 mg/dL — ABNORMAL HIGH (ref 70–99)
Glucose-Capillary: 108 mg/dL — ABNORMAL HIGH (ref 70–99)
Glucose-Capillary: 137 mg/dL — ABNORMAL HIGH (ref 70–99)
Glucose-Capillary: 135 mg/dL — ABNORMAL HIGH (ref 70–99)

## 2017-10-02 LAB — POCT I-STAT, CHEM 8
BUN: 18 mg/dL (ref 8–23)
CALCIUM ION: 1.16 mmol/L (ref 1.15–1.40)
CREATININE: 1.2 mg/dL (ref 0.61–1.24)
Chloride: 96 mmol/L — ABNORMAL LOW (ref 98–111)
Glucose, Bld: 150 mg/dL — ABNORMAL HIGH (ref 70–99)
HEMATOCRIT: 29 % — AB (ref 39.0–52.0)
Hemoglobin: 9.9 g/dL — ABNORMAL LOW (ref 13.0–17.0)
Potassium: 4.7 mmol/L (ref 3.5–5.1)
Sodium: 133 mmol/L — ABNORMAL LOW (ref 135–145)
TCO2: 26 mmol/L (ref 22–32)

## 2017-10-02 LAB — CREATININE, SERUM
CREATININE: 1.25 mg/dL — AB (ref 0.61–1.24)
GFR, EST NON AFRICAN AMERICAN: 57 mL/min — AB (ref >60)

## 2017-10-02 LAB — MAGNESIUM
MAGNESIUM: 2.3 mg/dL (ref 1.7–2.4)
Magnesium: 2.5 mg/dL — ABNORMAL HIGH (ref 1.7–2.4)

## 2017-10-02 MED ORDER — PROMETHAZINE HCL 25 MG/ML IJ SOLN: 12.5000 mg | Freq: Four times a day (QID) | INTRAMUSCULAR | Status: DC | PRN

## 2017-10-02 MED ORDER — METOCLOPRAMIDE HCL 5 MG/ML IJ SOLN
10.0000 mg | Freq: Four times a day (QID) | INTRAMUSCULAR | Status: AC
  Administered 2017-10-02 – 2017-10-03 (×3): 10 mg via INTRAVENOUS
  Filled 2017-10-02 (×4): qty 2

## 2017-10-02 MED ORDER — ENOXAPARIN SODIUM 40 MG/0.4ML ~~LOC~~ SOLN
40.0000 mg | Freq: Every day | SUBCUTANEOUS | Status: DC
  Administered 2017-10-02 – 2017-10-04 (×3): 40 mg via SUBCUTANEOUS
  Filled 2017-10-02 (×3): qty 0.4

## 2017-10-02 MED ORDER — INSULIN ASPART 100 UNIT/ML ~~LOC~~ SOLN
0.0000 [IU] | SUBCUTANEOUS | Status: DC
  Administered 2017-10-02 (×3): 2 [IU] via SUBCUTANEOUS

## 2017-10-02 MED ORDER — INSULIN DETEMIR 100 UNIT/ML ~~LOC~~ SOLN
15.0000 [IU] | Freq: Two times a day (BID) | SUBCUTANEOUS | Status: DC
  Administered 2017-10-02 (×2): 15 [IU] via SUBCUTANEOUS
  Filled 2017-10-02 (×3): qty 0.15

## 2017-10-02 MED ORDER — POTASSIUM CHLORIDE 10 MEQ/50ML IV SOLN
10.0000 meq | INTRAVENOUS | Status: AC
  Administered 2017-10-02 (×3): 10 meq via INTRAVENOUS
  Filled 2017-10-02 (×3): qty 50

## 2017-10-02 MED ORDER — FUROSEMIDE 10 MG/ML IJ SOLN
40.0000 mg | Freq: Once | INTRAMUSCULAR | Status: AC
  Administered 2017-10-02: 40 mg via INTRAVENOUS
  Filled 2017-10-02: qty 4

## 2017-10-02 NOTE — Progress Notes (Signed)
      [address]       [place] [address]             [phone]      Sleeping presently  BP 94/62   Pulse 66   Temp 98.8 F (37.1 C) (Oral)   Resp 17   Ht 5\' 11"  (1.803 m)   Wt 95 kg   SpO2 97%   BMI 29.21 kg/m   Intake/Output Summary (Last 24 hours) at 10/02/2017 1814 Last data filed at 10/02/2017 1800 Gross per 24 hour  Intake 1374.41 ml  Output 2113 ml  Net -738.59 ml  Creatinine 1.20, K 4.7, Hct= 30  Nausea has been a significant issue  Will start Reglan. Zofran and phenergan as needed  Revonda Standard. Roxan Hockey, MD [facility] [phone]

## 2017-10-02 NOTE — Progress Notes (Signed)
1 Day Post-Op Procedure(s) (LRB): CORONARY ARTERY BYPASS GRAFTING (CABG) x two, using left internal mammary artery and right leg greater saphenous vein harvested endoscopically (N/A) AORTIC VALVE REPLACEMENT (AVR) (N/A) TRANSESOPHAGEAL ECHOCARDIOGRAM (TEE) (N/A) Subjective: No complaints this AM. Had some nausea earlier.  Objective: Vital signs in last 24 hours: Temp:  [95.7 F (35.4 C)-100 F (37.8 C)] 99.3 F (37.4 C) (08/24 0800) Pulse Rate:  [70-82] 74 (08/24 0800) Cardiac Rhythm: Normal sinus rhythm (08/24 0800) Resp:  [12-31] 18 (08/24 0800) BP: (87-120)/(53-86) 111/82 (08/24 0800) SpO2:  [91 %-99 %] 93 % (08/24 0800) Arterial Line BP: (78-150)/(49-71) 124/59 (08/24 0800) FiO2 (%):  [40 %-50 %] 40 % (08/23 1645) Weight:  [95 kg] 95 kg (08/24 0600)  Hemodynamic parameters for last 24 hours: PAP: (22-38)/(7-19) 34/14 CO:  [4.1 L/min-6.1 L/min] 4.9 L/min CI:  [2 L/min/m2-2.9 L/min/m2] 2.3 L/min/m2  Intake/Output from previous day: 08/23 0701 - 08/24 0700 In: 5533.5 [I.V.:3881.2; Blood:602; IV Piggyback:1050.3] Out: 5485 [Urine:3505; Blood:1300; Chest Tube:680] Intake/Output this shift: Total I/O In: 33.1 [I.V.:33.1] Out: 10 [Chest Tube:10]  General appearance: alert, cooperative and no distress Neurologic: intact Heart: regular rate and rhythm Lungs: clear to auscultation bilaterally Abdomen: normal findings: soft, non-tender  Lab Results: Recent Labs    10/01/17 2003 10/02/17 0512  WBC 10.1 11.7*  HGB 10.2*  9.2* 9.4*  HCT 31.4*  27.0* 29.2*  PLT 130* 115*   BMET:  Recent Labs    10/01/17 2003 10/02/17 0512  NA 139 136  K 4.0 4.0  CL 101 105  CO2  --  25  GLUCOSE 142* 116*  BUN 14 14  CREATININE 1.12  1.00 1.07  CALCIUM  --  7.7*    PT/INR:  Recent Labs    10/01/17 1336  LABPROT 17.9*  INR 1.49   ABG    Component Value Date/Time   PHART 7.399 10/01/2017 1830   HCO3 25.6 10/01/2017 1830   TCO2 25 10/01/2017 2003   ACIDBASEDEF 6.0  (H) 10/01/2017 1403   O2SAT 97.0 10/01/2017 1830   CBG (last 3)  Recent Labs    10/02/17 0617 10/02/17 0727 10/02/17 0901  GLUCAP 126* 118* 117*    Assessment/Plan: S/P Procedure(s) (LRB): CORONARY ARTERY BYPASS GRAFTING (CABG) x two, using left internal mammary artery and right leg greater saphenous vein harvested endoscopically (N/A) AORTIC VALVE REPLACEMENT (AVR) (N/A) TRANSESOPHAGEAL ECHOCARDIOGRAM (TEE) (N/A) CV- s/p CABG, AVR  Good cardiac index- dc swan and A line  Plan ASA, no coumadin unless another indication  RESP- IS for atelectasis  RENAL- creatinine and lytes OK, diurese  ENDO- CBG well controlled with insulin drip- transition to levemir + SSI  Anemia secondary to ABL- mild, follow  Dc chest tubes  Cardiac rehab   LOS: 1 day    Melrose Nakayama 10/02/2017

## 2017-10-03 ENCOUNTER — Inpatient Hospital Stay (HOSPITAL_COMMUNITY): Payer: BLUE CROSS/BLUE SHIELD

## 2017-10-03 LAB — BASIC METABOLIC PANEL
ANION GAP: 7 (ref 5–15)
BUN: 16 mg/dL (ref 8–23)
CHLORIDE: 101 mmol/L (ref 98–111)
CO2: 27 mmol/L (ref 22–32)
Calcium: 8.1 mg/dL — ABNORMAL LOW (ref 8.9–10.3)
Creatinine, Ser: 1.12 mg/dL (ref 0.61–1.24)
GFR calc Af Amer: >60 mL/min (ref >60)
Glucose, Bld: 114 mg/dL — ABNORMAL HIGH (ref 70–99)
POTASSIUM: 4 mmol/L (ref 3.5–5.1)
Sodium: 135 mmol/L (ref 135–145)

## 2017-10-03 LAB — GLUCOSE, CAPILLARY
GLUCOSE-CAPILLARY: 122 mg/dL — AB (ref 70–99)
GLUCOSE-CAPILLARY: 116 mg/dL — AB (ref 70–99)
Glucose-Capillary: 117 mg/dL — ABNORMAL HIGH (ref 70–99)
Glucose-Capillary: 102 mg/dL — ABNORMAL HIGH (ref 70–99)
Glucose-Capillary: 119 mg/dL — ABNORMAL HIGH (ref 70–99)

## 2017-10-03 LAB — CBC
HEMATOCRIT: 27.7 % — AB (ref 39.0–52.0)
Hemoglobin: 8.7 g/dL — ABNORMAL LOW (ref 13.0–17.0)
MCH: 31.4 pg (ref 26.0–34.0)
MCHC: 31.4 g/dL (ref 30.0–36.0)
MCV: 100 fL (ref 78.0–100.0)
Platelets: 110 10*3/uL — ABNORMAL LOW (ref 150–400)
RBC: 2.77 MIL/uL — ABNORMAL LOW (ref 4.22–5.81)
RDW: 14 % (ref 11.5–15.5)
WBC: 13.3 10*3/uL — ABNORMAL HIGH (ref 4.0–10.5)

## 2017-10-03 MED ORDER — ALUM & MAG HYDROXIDE-SIMETH 200-200-20 MG/5ML PO SUSP: 15.0000 mL | Freq: Four times a day (QID) | ORAL | Status: DC | PRN

## 2017-10-03 MED ORDER — ATORVASTATIN CALCIUM 40 MG PO TABS
40.0000 mg | ORAL_TABLET | Freq: Every day | ORAL | Status: DC
  Administered 2017-10-03 – 2017-10-04 (×2): 40 mg via ORAL
  Filled 2017-10-03 (×2): qty 1

## 2017-10-03 MED ORDER — POTASSIUM CHLORIDE CRYS ER 20 MEQ PO TBCR
20.0000 meq | EXTENDED_RELEASE_TABLET | Freq: Two times a day (BID) | ORAL | Status: DC
  Administered 2017-10-03 – 2017-10-05 (×5): 20 meq via ORAL
  Filled 2017-10-03 (×5): qty 1

## 2017-10-03 MED ORDER — INSULIN ASPART 100 UNIT/ML ~~LOC~~ SOLN: 0.0000 [IU] | Freq: Three times a day (TID) | SUBCUTANEOUS | Status: DC

## 2017-10-03 MED ORDER — MAGNESIUM HYDROXIDE 400 MG/5ML PO SUSP: 30.0000 mL | Freq: Every day | ORAL | Status: DC | PRN

## 2017-10-03 MED ORDER — SODIUM CHLORIDE 0.9% FLUSH
3.0000 mL | Freq: Two times a day (BID) | INTRAVENOUS | Status: DC
  Administered 2017-10-03 – 2017-10-04 (×3): 3 mL via INTRAVENOUS

## 2017-10-03 MED ORDER — MOVING RIGHT ALONG BOOK
Freq: Once | Status: AC
  Administered 2017-10-03: 14:00:00
  Filled 2017-10-03: qty 1

## 2017-10-03 MED ORDER — SODIUM CHLORIDE 0.9 % IV SOLN: 250.0000 mL | INTRAVENOUS | Status: DC | PRN

## 2017-10-03 MED ORDER — FUROSEMIDE 40 MG PO TABS
40.0000 mg | ORAL_TABLET | Freq: Every day | ORAL | Status: DC
  Administered 2017-10-03 – 2017-10-05 (×3): 40 mg via ORAL
  Filled 2017-10-03 (×3): qty 1

## 2017-10-03 MED ORDER — SODIUM CHLORIDE 0.9% FLUSH: 3.0000 mL | INTRAVENOUS | Status: DC | PRN

## 2017-10-03 MED ORDER — ZOLPIDEM TARTRATE 5 MG PO TABS: 5.0000 mg | ORAL_TABLET | Freq: Every evening | ORAL | Status: DC | PRN

## 2017-10-03 NOTE — Progress Notes (Signed)
2 Days Post-Op Procedure(s) (LRB): CORONARY ARTERY BYPASS GRAFTING (CABG) x two, using left internal mammary artery and right leg greater saphenous vein harvested endoscopically (N/A) AORTIC VALVE REPLACEMENT (AVR) (N/A) TRANSESOPHAGEAL ECHOCARDIOGRAM (TEE) (N/A) Subjective: No complaints this AM, nausea resolved  Objective: Vital signs in last 24 hours: Temp:  [98.1 F (36.7 C)-98.9 F (37.2 C)] 98.4 F (36.9 C) (08/25 0734) Pulse Rate:  [66-87] 87 (08/25 0800) Cardiac Rhythm: Normal sinus rhythm (08/25 0800) Resp:  [12-27] 12 (08/25 0800) BP: (70-112)/(58-75) 112/71 (08/25 0800) SpO2:  [90 %-100 %] 95 % (08/25 0800) Arterial Line BP: (109-119)/(59-66) 113/59 (08/24 1400) Weight:  [94.9 kg] 94.9 kg (08/25 0500)  Hemodynamic parameters for last 24 hours:    Intake/Output from previous day: 08/24 0701 - 08/25 0700 In: 917 [P.O.:240; I.V.:322.2; IV Piggyback:354.8] Out: 1443 [Urine:1323; Chest Tube:120] Intake/Output this shift: Total I/O In: 258.2 [P.O.:240; I.V.:9.6; IV Piggyback:8.6] Out: 75 [Urine:75]  General appearance: alert, cooperative and no distress Neurologic: intact Heart: regular rate and rhythm Lungs: diminished breath sounds bibasilar Abdomen: normal findings: soft, non-tender  Lab Results: Recent Labs    10/02/17 1632 10/02/17 1643 10/03/17 0401  WBC 16.1*  --  13.3*  HGB 9.5* 9.9* 8.7*  HCT 30.4* 29.0* 27.7*  PLT 136*  --  110*   BMET:  Recent Labs    10/02/17 0512  10/02/17 1643 10/03/17 0401  NA 136  --  133* 135  K 4.0  --  4.7 4.0  CL 105  --  96* 101  CO2 25  --   --  27  GLUCOSE 116*  --  150* 114*  BUN 14  --  18 16  CREATININE 1.07   < > 1.20 1.12  CALCIUM 7.7*  --   --  8.1*   < > = values in this interval not displayed.    PT/INR:  Recent Labs    10/01/17 1336  LABPROT 17.9*  INR 1.49   ABG    Component Value Date/Time   PHART 7.399 10/01/2017 1830   HCO3 25.6 10/01/2017 1830   TCO2 26 10/02/2017 1643   ACIDBASEDEF 6.0 (H) 10/01/2017 1403   O2SAT 97.0 10/01/2017 1830   CBG (last 3)  Recent Labs    10/02/17 2304 10/03/17 0342 10/03/17 0732  GLUCAP 135* 117* 122*    Assessment/Plan: S/P Procedure(s) (LRB): CORONARY ARTERY BYPASS GRAFTING (CABG) x two, using left internal mammary artery and right leg greater saphenous vein harvested endoscopically (N/A) AORTIC VALVE REPLACEMENT (AVR) (N/A) TRANSESOPHAGEAL ECHOCARDIOGRAM (TEE) (N/A) Plan for transfer to step-down: see transfer orders  Looks much better this AM CV- in SR  Continue ASA, beta blocker, statin, restart lisinopril prior to dc  Tissue valve- plan ASA, no coumadin unless other indication  RESP- continue IS  RENAL- creatinine and lytes Ok  ENDO- CBG better controlled  Dc levemir, change SSI to [facility] and HS  Anemia secondary to ABL- mild, follow  Gi nausea resolved, advance diet   LOS: 2 days    Melrose Nakayama 10/03/2017

## 2017-10-03 NOTE — Progress Notes (Signed)
Pt transferred from 2 Heart. VSS, CHG bath given, placed on tele, CCMD called and verified. Pt oriented to unit. Pt foley removed prior to transfer. Pt aware he needs to void before 16:30. Pt resting with call bell within reach.  Will continue to monitor. Payton Emerald, RN

## 2017-10-03 NOTE — Progress Notes (Signed)
Pt HR sustained above 130s, irregular. Given 5mg  IV metoprolol.   Fritz Pickerel, RN

## 2017-10-03 NOTE — Progress Notes (Signed)
Notified by tele that HR was as high as 170s. Bedside telemetry reading atrial fib. Pt asymptomatic, lying in bed. EKG obtained - shows SR with prolonged QTC. Ellwood Handler PA notified. HR returned to 70s-80s. BP 103/67, pt without complaint. Verbal order to use PRN IV metoprolol as needed.  Fritz Pickerel, RN

## 2017-10-04 ENCOUNTER — Encounter (HOSPITAL_COMMUNITY): Payer: Self-pay | Admitting: Surgery

## 2017-10-04 ENCOUNTER — Inpatient Hospital Stay (HOSPITAL_COMMUNITY): Payer: BLUE CROSS/BLUE SHIELD

## 2017-10-04 LAB — CBC
HEMATOCRIT: 27 % — AB (ref 39.0–52.0)
HEMOGLOBIN: 8.6 g/dL — AB (ref 13.0–17.0)
MCH: 31.4 pg (ref 26.0–34.0)
MCHC: 31.9 g/dL (ref 30.0–36.0)
MCV: 98.5 fL (ref 78.0–100.0)
PLATELETS: 119 10*3/uL — AB (ref 150–400)
RBC: 2.74 MIL/uL — AB (ref 4.22–5.81)
RDW: 13.9 % (ref 11.5–15.5)
WBC: 8.8 10*3/uL (ref 4.0–10.5)

## 2017-10-04 LAB — BASIC METABOLIC PANEL
ANION GAP: 6 (ref 5–15)
BUN: 15 mg/dL (ref 8–23)
CHLORIDE: 104 mmol/L (ref 98–111)
CO2: 28 mmol/L (ref 22–32)
Calcium: 8 mg/dL — ABNORMAL LOW (ref 8.9–10.3)
Creatinine, Ser: 1.12 mg/dL (ref 0.61–1.24)
GFR calc Af Amer: >60 mL/min (ref >60)
GFR calc non Af Amer: >60 mL/min (ref >60)
GLUCOSE: 98 mg/dL (ref 70–99)
POTASSIUM: 4.2 mmol/L (ref 3.5–5.1)
Sodium: 138 mmol/L (ref 135–145)

## 2017-10-04 LAB — GLUCOSE, CAPILLARY
GLUCOSE-CAPILLARY: 99 mg/dL (ref 70–99)
Glucose-Capillary: 99 mg/dL (ref 70–99)
Glucose-Capillary: 86 mg/dL (ref 70–99)
Glucose-Capillary: 100 mg/dL — ABNORMAL HIGH (ref 70–99)

## 2017-10-04 MED FILL — Sodium Bicarbonate IV Soln 8.4%: INTRAVENOUS | Qty: 50 | Status: AC

## 2017-10-04 MED FILL — Lidocaine HCl(Cardiac) IV PF Soln Pref Syr 100 MG/5ML (2%): INTRAVENOUS | Qty: 5 | Status: AC

## 2017-10-04 MED FILL — Heparin Sodium (Porcine) Inj 1000 Unit/ML: INTRAMUSCULAR | Qty: 10 | Status: AC

## 2017-10-04 MED FILL — Thrombin For Soln Kit 20000 Unit: CUTANEOUS | Qty: 1 | Status: AC

## 2017-10-04 MED FILL — Electrolyte-R (PH 7.4) Solution: INTRAVENOUS | Qty: 4 | Status: AC

## 2017-10-04 MED FILL — Mannitol IV Soln 20%: INTRAVENOUS | Qty: 500 | Status: AC

## 2017-10-04 MED FILL — Sodium Chloride IV Soln 0.9%: INTRAVENOUS | Qty: 2000 | Status: AC

## 2017-10-04 NOTE — Discharge Instructions (Signed)
Endoscopic Saphenous Vein Harvesting, Care After Refer to this sheet in the next few weeks. These instructions provide you with information about caring for yourself after your procedure. Your health care provider may also give you more specific instructions. Your treatment has been planned according to current medical practices, but problems sometimes occur. Call your health care provider if you have any problems or questions after your procedure. What can I expect after the procedure? After the procedure, it is common to have:  Pain.  Bruising.  Swelling.  Numbness.  Follow these instructions at home: Medicine  Take over-the-counter and prescription medicines only as told by your health care provider.  Do not drive or operate heavy machinery while taking prescription pain medicine. Incision care   Follow instructions from your health care provider about how to take care of the cut made during surgery (incision). Make sure you: ? Wash your hands with soap and water before you change your bandage (dressing). If soap and water are not available, use hand sanitizer. ? Change your dressing as told by your health care provider. ? Leave stitches (sutures), skin glue, or adhesive strips in place. These skin closures may need to be in place for 2 weeks or longer. If adhesive strip edges start to loosen and curl up, you may trim the loose edges. Do not remove adhesive strips completely unless your health care provider tells you to do that.  Check your incision area every day for signs of infection. Check for: ? More redness, swelling, or pain. ? More fluid or blood. ? Warmth. ? Pus or a bad smell. General instructions  Raise (elevate) your legs above the level of your heart while you are sitting or lying down.  Do any exercises your health care providers have given you. These may include deep breathing, coughing, and walking exercises.  Do not shower, take baths, swim, or use a hot tub  unless told by your health care provider.  Wear your elastic stocking if told by your health care provider.  Keep all follow-up visits as told by your health care provider. This is important. Contact a health care provider if:  Medicine does not help your pain.  Your pain gets worse.  You have new leg bruises or your leg bruises get bigger.  You have a fever.  Your leg feels numb.  You have more redness, swelling, or pain around your incision.  You have more fluid or blood coming from your incision.  Your incision feels warm to the touch.  You have pus or a bad smell coming from your incision. Get help right away if:  Your pain is severe.  You develop pain, tenderness, warmth, redness, or swelling in any part of your leg.  You have chest pain.  You have trouble breathing. This information is not intended to replace advice given to you by your health care provider. Make sure you discuss any questions you have with your health care provider. Document Released: 10/08/2010 Document Revised: 07/04/2015 Document Reviewed: 12/10/2014 Elsevier Interactive Patient Education  2018 [place]. Aortic Valve Replacement, Care After Refer to this sheet in the next few weeks. These instructions provide you with information about caring for yourself after your procedure. Your health care provider may also give you more specific instructions. Your treatment has been planned according to current medical practices, but problems sometimes occur. Call your health care provider if you have any problems or questions after your procedure. What can I expect after the procedure? After  the procedure, it is common to have:  Pain around your incision area.  A small amount of blood or clear fluid coming from your incision.  Follow these instructions at home: Eating and drinking   Follow instructions from your health care provider about eating or drinking restrictions. ? Limit alcohol intake to  no more than 1 drink per day for nonpregnant women and 2 drinks per day for men. One drink equals 12 oz of beer, 5 oz of wine, or 1 oz of hard liquor. ? Limit how much caffeine you drink. Caffeine can affect your heart's rate and rhythm.  Drink enough fluid to keep your urine clear or pale yellow.  Eat a heart-healthy diet. This should include plenty of fresh fruits and vegetables. If you eat meat, it should be lean cuts. Avoid foods that are: ? High in salt, saturated fat, or sugar. ? Canned or highly processed. ? Fried. Activity  Return to your normal activities as told by your health care provider. Ask your health care provider what activities are safe for you.  Exercise regularly once you have recovered, as told by your health care provider.  Avoid sitting for more than 2 hours at a time without moving. Get up and move around at least once every 1-2 hours. This helps to prevent blood clots in the legs.  Do not lift anything that is heavier than 10 lb (4.5 kg) until your health care provider approves.  Avoid pushing or pulling things with your arms until your health care provider approves. This includes pulling on handrails to help you climb stairs. Incision care   Follow instructions from your health care provider about how to take care of your incision. Make sure you: ? Wash your hands with soap and water before you change your bandage (dressing). If soap and water are not available, use hand sanitizer. ? Change your dressing as told by your health care provider. ? Leave stitches (sutures), skin glue, or adhesive strips in place. These skin closures may need to stay in place for 2 weeks or longer. If adhesive strip edges start to loosen and curl up, you may trim the loose edges. Do not remove adhesive strips completely unless your health care provider tells you to do that.  Check your incision area every day for signs of infection. Check for: ? More redness, swelling, or  pain. ? More fluid or blood. ? Warmth. ? Pus or a bad smell. Medicines  Take over-the-counter and prescription medicines only as told by your health care provider.  If you were prescribed an antibiotic medicine, take it as told by your health care provider. Do not stop taking the antibiotic even if you start to feel better. Travel  Avoid airplane travel for as long as told by your health care provider.  When you travel, bring a list of your medicines and a record of your medical history with you. Carry your medicines with you. Driving  Ask your health care provider when it is safe for you to drive. Do not drive until your health care provider approves.  Do not drive or operate heavy machinery while taking prescription pain medicine. Lifestyle   Do not use any tobacco products, such as cigarettes, chewing tobacco, or e-cigarettes. If you need help quitting, ask your health care provider.  Resume sexual activity as told by your health care provider. Do not use medicines for erectile dysfunction unless your health care provider approves, if this applies.  Work with your health  care provider to keep your blood pressure and cholesterol under control, and to manage any other heart conditions that you have.  Maintain a healthy weight. General instructions  Do not take baths, swim, or use a hot tub until your health care provider approves.  Do not strain to have a bowel movement.  Avoid crossing your legs while sitting down.  Check your temperature every day for a fever. A fever may be a sign of infection.  If you are a woman and you plan to become pregnant, talk with your health care provider before you become pregnant.  Wear compression stockings if your health care provider instructs you to do this. These stockings help to prevent blood clots and reduce swelling in your legs.  Tell all health care providers who care for you that you have an artificial (prosthetic) aortic valve.  If you have or have had heart disease or endocarditis, tell all health care providers about these conditions as well.  Keep all follow-up visits as told by your health care provider. This is important. Contact a health care provider if:  You develop a skin rash.  You experience sudden, unexplained changes in your weight.  You have more redness, swelling, or pain around your incision.  You have more fluid or blood coming from your incision.  Your incision feels warm to the touch.  You have pus or a bad smell coming from your incision.  You have a fever. Get help right away if:  You develop chest pain that is different from the pain coming from your incision.  You develop shortness of breath or difficulty breathing.  You start to feel light-headed. These symptoms may represent a serious problem that is an emergency. Do not wait to see if the symptoms will go away. Get medical help right away. Call your local emergency services (911 in the [place] Do not drive yourself to the hospital. This information is not intended to replace advice given to you by your health care provider. Make sure you discuss any questions you have with your health care provider. Document Released: 08/14/2004 Document Revised: 07/04/2015 Document Reviewed: 12/30/2014 Elsevier Interactive Patient Education  2017 [employer] Coronary Artery Bypass Grafting, Care After These instructions give you information on caring for yourself after your procedure. Your doctor may also give you more specific instructions. Call your doctor if you have any problems or questions after your procedure. Follow these instructions at home:  Only take medicine as told by your doctor. Take medicines exactly as told. Do not stop taking medicines or start any new medicines without talking to your doctor first.  Take your pulse as told by your doctor.  Do deep breathing as told by your doctor. Use your breathing device (incentive  spirometer), if given, to practice deep breathing several times a day. Support your chest with a pillow or your arms when you take deep breaths or cough.  Keep the area clean, dry, and protected where the surgery cuts (incisions) were made. Remove bandages (dressings) only as told by your doctor. If strips were applied to surgical area, do not take them off. They fall off on their own.  Check the surgery area daily for puffiness (swelling), redness, or leaking fluid.  If surgery cuts were made in your legs: ? Avoid crossing your legs. ? Avoid sitting for long periods of time. Change positions every 30 minutes. ? Raise your legs when you are sitting. Place them on pillows.  Wear stockings that help keep  blood clots from forming in your legs (compression stockings).  Only take sponge baths until your doctor says it is okay to take showers. Pat the surgery area dry. Do not rub the surgery area with a washcloth or towel. Do not bathe, swim, or use a hot tub until your doctor says it is okay.  Eat foods that are high in fiber. These include raw fruits and vegetables, whole grains, beans, and nuts. Choose lean meats. Avoid canned, processed, and fried foods.  Drink enough fluids to keep your pee (urine) clear or pale yellow.  Weigh yourself every day.  Rest and limit activity as told by your doctor. You may be told to: ? Stop any activity if you have chest pain, shortness of breath, changes in heartbeat, or dizziness. Get help right away if this happens. ? Move around often for short amounts of time or take short walks as told by your doctor. Gradually become more active. You may need help to strengthen your muscles and build endurance. ? Avoid lifting, pushing, or pulling anything heavier than 10 pounds (4.5 kg) for at least 6 weeks after surgery.  Do not drive until your doctor says it is okay.  Ask your doctor when you can go back to work.  Ask your doctor when you can begin sexual  activity again.  Follow up with your doctor as told. Contact a doctor if:  You have puffiness, redness, more pain, or fluid draining from the incision site.  You have a fever.  You have puffiness in your ankles or legs.  You have pain in your legs.  You gain 2 or more pounds (0.9 kg) a day.  You feel sick to your stomach (nauseous) or throw up (vomit).  You have watery poop (diarrhea). Get help right away if:  You have chest pain that goes to your jaw or arms.  You have shortness of breath.  You have a fast or irregular heartbeat.  You notice a "clicking" in your breastbone when you move.  You have numbness or weakness in your arms or legs.  You feel dizzy or light-headed. This information is not intended to replace advice given to you by your health care provider. Make sure you discuss any questions you have with your health care provider. Document Released: 01/31/2013 Document Revised: 07/04/2015 Document Reviewed: 07/05/2012 Elsevier Interactive Patient Education  2017 [employer].

## 2017-10-04 NOTE — Discharge Summary (Signed)
Physician Discharge Summary  Patient ID: Danny Odom MRN: [id number] DOB/AGE: 07/28/1950 67 y.o.  Admit date: 10/01/2017 Discharge date: 10/14/2017  Admission Diagnoses: Aortic stenosis and coronary artery disease  Discharge Diagnoses:  Active Problems:   S/P AVR  Patient Active Problem List   Diagnosis Date Noted  . S/P AVR 10/01/2017  . Severe aortic valve stenosis 08/23/2017  . Hypertension   . Cardiac murmur   . Malignant neoplasm of prostate ([place]) 10/20/2013   HPI:  The patient is a 67 year old gentleman with a history of hypertension, hyperlipidemia, hypothyroidism, prostate cancer status post robot-assisted laparoscopic radical prostatectomy in 10/2013 with a subsequent rise in his PSA and radiation therapy in 2016, and known aortic stenosis. He says that he had a stress echocardiogram in [place] in May 2014 which was normal. He was having some exertional shortness of breath and fatigue at that time and says he was told that he has some aortic stenosis but it was not enough to warrant surgery. He says he is continued to have dyspnea on exertion since then which is recently become a little worse. He feels like it is limiting his ability to be as active as he wants to be. He can walk on level ground but on inclines or going up stairs he has to take a break. He denies any chest pain or pressure. He has occasional dizziness particularly when he gets up from a sitting or laying down position. He has had some lower extremity edema. A 2D echocardiogram on 09/22/2016 showed mild aortic stenosis with a mean gradient of 22 mmHg and a dimensionless index of 0.42. There was moderate aortic insufficiency. Left ventricular ejection fraction was normal at 55 to 60% with grade 1 diastolic dysfunction. His most recent echocardiogram on 08/09/2017 showed a rise in his mean gradient to 30 mmHg with a peak gradient of 54 mmHg. The dimensionless index was 0.42. There was still a moderate aortic insufficiency.  Left ventricular ejection fraction remains normal at 60 to 65%. The left ventricular internal dimensions remain within normal limits. He underwent cardiac catheterization on 08/23/2017. This showed severe two-vessel coronary disease. There is a 95% ostial stenosis of a first diagonal branch which is a moderate-sized vessel. There is also a 75% mid to distal left circumflex stenosis before a large marginal branch which looked hazy. The LAD had no significant stenosis. The right coronary artery had no significant stenosis. The mean aortic valve gradient was 29 mmHg with the LVEDP measured at 23 mmHg. Right heart pressures were normal. There is also question of an enlarged aorta by CTA of the chest in 2017 when it was measured at 4.2 x 4.0 cm. A CT scan of the chest in September 2018 measured the aorta at 4 cm. A repeat CTA of the chest on 08/30/2017 shows a maximum diameter at the sinus level at 3.9 cm and the ascending aorta measuring 3.8 to 3.9 cm in greatest diameter. Radiology felt that the previous measurements of 4.0 and 4.2 cm were due to measurements that were not perpendicular to the long axis of the aorta.  The patient is here today with his daughter. He is divorced. She lives in [place] and he lives in [place] but comes to [place] for his medical care. He still works. His prostate cancer has been taken care of by Dr. Tresa Moore. The patient reports that after his radiation therapy his PSA level went to 0 but then has increased again and he is undergone hormone therapy.  He was referred to Gilford Raid, MD in cardiothoracic surgical consultation.  Dr. Cyndia Bent evaluated the patient and his studies and agree with recommendations to proceed with aortic valve replacement and coronary artery bypass grafting.  He was admitted this hospitalization for the procedure.    Discharged Condition: good  Hospital Course: The patient was admitted and on 10/01/2017 taken to the operating room where  he underwent the below described procedure.  He tolerated it well and was taken to the surgical intensive care unit in stable condition.  Postoperative hospital course:  The patient progressed quite nicely.  He has maintained stable hemodynamics.  All routine lines, monitors and drainage devices were discontinued in the standard fashion.  He did have postoperative atelectasis but responded well to routine incentive spirometry and pulmonary toilet.  Renal function has not remained stable.  He did have some postoperative volume overload requiring diuresis.  Blood sugars have been under adequate control using standard measures.  His preoperative hemoglobin A1c is 5.5.  He does have a stable acute blood loss anemia.  Most recent hemoglobin hematocrit dated 10/04/2017 were 8.6 and 27.0.  His recent BUN and creatinine same date are 15/1.12.  He tolerated gradually increasing activities using standard cardiac rehab phase 1 modalities.  Incisions were healing well without evidence of infection.  Oxygen was weaned and he maintains good saturations on room air.  He was tolerating diet.  At the time of discharge the patient was felt to be quite stable.  Consults: None  Significant Diagnostic Studies: Routine postoperative serial chest x-rays and blood tests  Treatments: surgery:   CARDIOVASCULAR SURGERY OPERATIVE NOTE  10/01/2017  Surgeon:  Gaye Pollack, MD  First Assistant: Jadene Pierini,  PA-C   Preoperative Diagnosis:  Severe two-vessel coronary artery disease, Moderate aortic stenosis and regurgitation.   Postoperative Diagnosis:  Same   Procedure:  1. Median Sternotomy 2. Extracorporeal circulation 3.   Coronary artery bypass grafting x 2   Left internal mammary graft to the Diagonal  SVG to OM   4.   Endoscopic vein harvest from the right leg 5.   Aortic valve replacement using a 25 mm Edwards INSPIRIS RESILIA pericardial valve.   Anesthesia:  General Endotracheal                                                                                         CARDIOVASCULAR SURGERY OPERATIVE NOTE  10/01/2017  Surgeon:  Gaye Pollack, MD  First Assistant: Jadene Pierini,  PA-C   Preoperative Diagnosis:  Severe two-vessel coronary artery disease, Moderate aortic stenosis and regurgitation.   Postoperative Diagnosis:  Same   Procedure:  1. Median Sternotomy 2. Extracorporeal circulation 3.   Coronary artery bypass grafting x 2   Left internal mammary graft to the Diagonal  SVG to OM   4.   Endoscopic vein harvest from the right leg 5.   Aortic valve replacement using a 25 mm Edwards INSPIRIS RESILIA pericardial valve.   Anesthesia:  General Endotracheal Discharge Exam: Blood pressure 119/82, pulse 75, temperature 99 F (37.2 C), temperature source Oral, resp. rate 20, height 5\' 11"  (1.803 m), weight 90.5  kg, SpO2 96 %.  General appearance: alert, cooperative and no distress Heart: regular rate and rhythm Lungs: clear to auscultation bilaterally Abdomen: benign Extremities: min edema Wound: incis healing well   Disposition: Discharge disposition: 01-Home or Self Care       Discharge Instructions    Amb Referral to Cardiac Rehabilitation   Complete by:  As directed    To [place]   Diagnosis:   CABG Valve Replacement     Valve:  Aortic   CABG X ___:  2   Discharge patient   Complete by:  As directed    Discharge disposition:  01-Home or Self Care   Discharge patient date:  10/05/2017     Allergies as of 10/05/2017   No Known Allergies     Medication List    STOP taking these medications   amLODipine 2.5 MG tablet Commonly known as:  NORVASC   lisinopril 10 MG tablet Commonly known as:  PRINIVIL,ZESTRIL   naproxen sodium 220 MG tablet Commonly known as:  ALEVE     TAKE these medications   aspirin 325 MG EC tablet Take 1 tablet (325 mg total) by mouth daily. What changed:    medication  strength  how much to take  when to take this   atorvastatin 40 MG tablet Commonly known as:  LIPITOR Take 1 tablet (40 mg total) by mouth daily at 6 PM.   levothyroxine 75 MCG tablet Commonly known as:  SYNTHROID, LEVOTHROID Take 75 mcg by mouth daily before breakfast.   metoprolol tartrate 25 MG tablet Commonly known as:  LOPRESSOR Take 0.5 tablets (12.5 mg total) by mouth 2 (two) times daily.   multivitamin with minerals Tabs tablet Take 1 tablet by mouth daily with breakfast. ONE-A-DAY MEN'S 50+   zinc gluconate 50 MG tablet Take 50 mg by mouth daily with breakfast.     ASK your doctor about these medications   oxyCODONE 5 MG immediate release tablet Commonly known as:  Oxy IR/ROXICODONE Take 1-2 tablets (5-10 mg total) by mouth every 6 (six) hours as needed for up to 7 days for severe pain. Ask about: Should I take this medication?      Follow-up Information    [name], MD Follow up.   Specialty:  Cardiology Why:  Please see discharge paperwork for a 2-week cardiology follow-up appointment. Contact information: [address] [place] [address] [phone]        [facility] [place] Follow up.   Specialty:  Cardiothoracic Surgery Why:  Appointment with the nurse on October 12, 2017 at 10:30 AM for suture removal. Contact information: [place], [place]       Gaye Pollack, MD Follow up.   Specialty:  Cardiothoracic Surgery Why:  Appointment with the surgeon on November 10, 2017 at 11 AM.  Please obtain a chest x-ray at [facility] imaging at 10:30 AM.  It is located in the same office complex on the first floor. Contact information: [place] [address] [place] [address] [phone]          The patient has been discharged on:   1.Beta Blocker:  Yes [ y  ]                              No   [   ]  If No, reason:  2.Ace Inhibitor/ARB: Yes [   ]                                     No  [   n ]                                     If No, reason:labile BP  3.Statin:   Yes [ y  ]                  No  [   ]                  If No, reason:  4.Ecasa:  Yes  [ y  ]                  No   [   ]                  If No, reason:  Signed: John Giovanni 10/14/2017, 9:08 AM  Pager [phone]

## 2017-10-04 NOTE — Progress Notes (Signed)
CARDIAC REHAB PHASE I   PRE:  Rate/Rhythm: 23 JR, briefly SR    BP: sitting 115/77    SaO2: 88-92 2L  MODE:  Ambulation: 470 ft   POST:  Rate/Rhythm: 102 JR    BP: sitting 115/72     SaO2: 97 2L  Pt moving well. Able to stand and walk with RW and 2L O2. No major c/o. Pt SAO2 low in chair but seemed to increase to high 90s after walk. Encouraged 2-3 more walks today and more IS. Pt compliant. He is interested in [facility] and we will send referral to [place], ACSM 10/04/2017 9:45 AM

## 2017-10-04 NOTE — Progress Notes (Addendum)
[address]       [employer] [address]             [phone]      3 Days Post-Op Procedure(s) (LRB): CORONARY ARTERY BYPASS GRAFTING (CABG) x two, using left internal mammary artery and right leg greater saphenous vein harvested endoscopically (N/A) AORTIC VALVE REPLACEMENT (AVR) (N/A) TRANSESOPHAGEAL ECHOCARDIOGRAM (TEE) (N/A) Subjective: Feels reasonably well   Objective: Vital signs in last 24 hours: Temp:  [98.4 F (36.9 C)-99.1 F (37.3 C)] 98.4 F (36.9 C) (08/26 0506) Pulse Rate:  [71-87] 79 (08/26 0506) Cardiac Rhythm: Normal sinus rhythm (08/26 0731) Resp:  [12-33] 23 (08/26 0512) BP: (85-139)/(60-76) 103/76 (08/26 0506) SpO2:  [91 %-98 %] 91 % (08/26 0512) FiO2 (%):  [36 %] 36 % (08/25 1114) Weight:  [92.3 kg] 92.3 kg (08/26 0512)  Hemodynamic parameters for last 24 hours:    Intake/Output from previous day: 08/25 0701 - 08/26 0700 In: 498.2 [P.O.:480; I.V.:9.6; IV Piggyback:8.6] Out: 1150 [Urine:1150] Intake/Output this shift: No intake/output data recorded.  General appearance: alert, cooperative and no distress Heart: regular rate and rhythm Lungs: min dim in bases Abdomen: benign Extremities: trace edema Wound: incis healing well  Lab Results: Recent Labs    10/03/17 0401 10/04/17 0406  WBC 13.3* 8.8  HGB 8.7* 8.6*  HCT 27.7* 27.0*  PLT 110* 119*   BMET:  Recent Labs    10/03/17 0401 10/04/17 0406  NA 135 138  K 4.0 4.2  CL 101 104  CO2 27 28  GLUCOSE 114* 98  BUN 16 15  CREATININE 1.12 1.12  CALCIUM 8.1* 8.0*    PT/INR:  Recent Labs    10/01/17 1336  LABPROT 17.9*  INR 1.49   ABG    Component Value Date/Time   PHART 7.399 10/01/2017 1830   HCO3 25.6 10/01/2017 1830   TCO2 26 10/02/2017 1643   ACIDBASEDEF 6.0 (H) 10/01/2017 1403   O2SAT 97.0 10/01/2017 1830   CBG (last 3)  Recent Labs    10/03/17 1704 10/03/17 2131 10/04/17 0647  GLUCAP 119* 116* 99    Meds Scheduled Meds: .  acetaminophen  1,000 mg Oral Q6H   Or  . acetaminophen (TYLENOL) oral liquid 160 mg/5 mL  1,000 mg Per Tube Q6H  . aspirin EC  325 mg Oral Daily   Or  . aspirin  324 mg Per Tube Daily  . atorvastatin  40 mg Oral q1800  . bisacodyl  10 mg Oral Daily   Or  . bisacodyl  10 mg Rectal Daily  . docusate sodium  200 mg Oral Daily  . enoxaparin (LOVENOX) injection  40 mg Subcutaneous QHS  . furosemide  40 mg Oral Daily  . insulin aspart  0-15 Units Subcutaneous TID WC  . levothyroxine  75 mcg Oral QAC breakfast  . mouth rinse  15 mL Mouth Rinse BID  . metoprolol tartrate  12.5 mg Oral BID  . pantoprazole  40 mg Oral Daily  . potassium chloride  20 mEq Oral BID  . sodium chloride flush  3 mL Intravenous Q12H   Continuous Infusions: . sodium chloride    . sodium chloride    . dexmedetomidine (PRECEDEX) IV infusion Stopped (10/02/17 0544)  . lactated ringers Stopped (10/01/17 2052)  . lactated ringers Stopped (10/03/17 0757)  . nitroGLYCERIN Stopped (10/02/17 0400)  . phenylephrine (NEO-SYNEPHRINE) Adult infusion Stopped (10/01/17 1415)   PRN Meds:.sodium chloride, alum & mag hydroxide-simeth, lactated ringers,  magnesium hydroxide, metoprolol tartrate, midazolam, morphine injection, ondansetron (ZOFRAN) IV, oxyCODONE, promethazine, sodium chloride flush, traMADol, zolpidem  Xrays Dg Chest 2 View  Result Date: 10/04/2017 CLINICAL DATA:  Status post aortic valve replacement. EXAM: CHEST - 2 VIEW COMPARISON:  10/03/2017 FINDINGS: Right jugular central venous catheter has been removed. Prosthetic aortic valve is present with median sternotomy wires. Few densities at the left lung base are suggestive for atelectasis. Atelectasis and/or small amount of pleural fluid along the right lung fissures. Negative for a pneumothorax. Heart size is stable. Mild blunting at the costophrenic angles. IMPRESSION: Small amount of atelectasis and pleural fluid in the chest. Postoperative changes. Removal of  central line. Electronically Signed   By: Markus Daft M.D.   On: 10/04/2017 07:10   Dg Chest Port 1 View  Result Date: 10/03/2017 CLINICAL DATA:  Status post coronary bypass graft. EXAM: PORTABLE CHEST 1 VIEW COMPARISON:  Radiograph of October 02, 2017. FINDINGS: Stable cardiomediastinal silhouette. Left-sided chest tube has been removed without pneumothorax. Right internal jugular Swan-Ganz catheter has been removed. Venous sheath remains. Status post aortic valve replacement. Stable bibasilar subsegmental atelectasis. Bony thorax is unremarkable. IMPRESSION: Left-sided chest tube is been removed without pneumothorax. Stable bibasilar subsegmental atelectasis. Electronically Signed   By: Marijo Conception, M.D.   On: 10/03/2017 07:33    Assessment/Plan: S/P Procedure(s) (LRB): CORONARY ARTERY BYPASS GRAFTING (CABG) x two, using left internal mammary artery and right leg greater saphenous vein harvested endoscopically (N/A) AORTIC VALVE REPLACEMENT (AVR) (N/A) TRANSESOPHAGEAL ECHOCARDIOGRAM (TEE) (N/A)  1 conts to do well overall 2 hemodyn stable in sinus rhythm, SBP 85-135 range 3 wean O2 , cont pulm toilet/routine CR1 4 cbg control in good- HGA1c 5.5 5 H/H stable ABL anemia 6 renal fxn is normal 7 d/c wires 8 poss home 1-2 days  LOS: 3 days    Danny Odom 10/04/2017 [id number] [phone]   Chart reviewed, patient examined, agree with above. Weight is still a little above preop so will diurese further. CXR ok. Continue IS, ambulation.

## 2017-10-04 NOTE — Progress Notes (Signed)
Pacing wires removed per order 

## 2017-10-05 LAB — GLUCOSE, CAPILLARY: Glucose-Capillary: 89 mg/dL (ref 70–99)

## 2017-10-05 MED ORDER — ATORVASTATIN CALCIUM 40 MG PO TABS: 40.0000 mg | ORAL_TABLET | Freq: Every day | ORAL | 1 refills | Status: DC

## 2017-10-05 MED ORDER — OXYCODONE HCL 5 MG PO TABS: 5.0000 mg | ORAL_TABLET | Freq: Four times a day (QID) | ORAL | 0 refills | Status: AC | PRN

## 2017-10-05 MED ORDER — METOPROLOL TARTRATE 25 MG PO TABS: 12.5000 mg | ORAL_TABLET | Freq: Two times a day (BID) | ORAL | 1 refills | Status: DC

## 2017-10-05 MED ORDER — ASPIRIN 325 MG PO TBEC: 325.0000 mg | DELAYED_RELEASE_TABLET | Freq: Every day | ORAL | Status: DC

## 2017-10-05 MED FILL — Magnesium Sulfate Inj 50%: INTRAMUSCULAR | Qty: 10 | Status: AC

## 2017-10-05 MED FILL — Potassium Chloride Inj 2 mEq/ML: INTRAVENOUS | Qty: 40 | Status: AC

## 2017-10-05 MED FILL — Heparin Sodium (Porcine) Inj 1000 Unit/ML: INTRAMUSCULAR | Qty: 30 | Status: AC

## 2017-10-05 NOTE — Progress Notes (Signed)
Danny Odom to be D/C'd Home per MD order. Discussed with the patient and all questions fully answered.    An After Visit Summary was printed and given to the patient.   Cyndra Numbers  10/05/2017 11:03 AM

## 2017-10-05 NOTE — Progress Notes (Signed)
[address]       [employer] [address]             [phone]      4 Days Post-Op Procedure(s) (LRB): CORONARY ARTERY BYPASS GRAFTING (CABG) x two, using left internal mammary artery and right leg greater saphenous vein harvested endoscopically (N/A) AORTIC VALVE REPLACEMENT (AVR) (N/A) TRANSESOPHAGEAL ECHOCARDIOGRAM (TEE) (N/A) Subjective: Feels fine , no specific issues  Objective: Vital signs in last 24 hours: Temp:  [98.1 F (36.7 C)-99 F (37.2 C)] 99 F (37.2 C) (08/27 0300) Pulse Rate:  [73-75] 75 (08/27 0300) Cardiac Rhythm: Normal sinus rhythm (08/27 0728) Resp:  [18-24] 20 (08/27 0300) BP: (95-132)/(72-83) 119/82 (08/27 0300) SpO2:  [92 %-97 %] 96 % (08/27 0300) Weight:  [90.5 kg] 90.5 kg (08/27 0629)  Hemodynamic parameters for last 24 hours:    Intake/Output from previous day: 08/26 0701 - 08/27 0700 In: 450 [P.O.:450] Out: 1250 [Urine:1250] Intake/Output this shift: No intake/output data recorded.  General appearance: alert, cooperative and no distress Heart: regular rate and rhythm Lungs: clear to auscultation bilaterally Abdomen: benign Extremities: min edema Wound: incis healing well  Lab Results: Recent Labs    10/03/17 0401 10/04/17 0406  WBC 13.3* 8.8  HGB 8.7* 8.6*  HCT 27.7* 27.0*  PLT 110* 119*   BMET:  Recent Labs    10/03/17 0401 10/04/17 0406  NA 135 138  K 4.0 4.2  CL 101 104  CO2 27 28  GLUCOSE 114* 98  BUN 16 15  CREATININE 1.12 1.12  CALCIUM 8.1* 8.0*    PT/INR: No results for input(s): LABPROT, INR in the last 72 hours. ABG    Component Value Date/Time   PHART 7.399 10/01/2017 1830   HCO3 25.6 10/01/2017 1830   TCO2 26 10/02/2017 1643   ACIDBASEDEF 6.0 (H) 10/01/2017 1403   O2SAT 97.0 10/01/2017 1830   CBG (last 3)  Recent Labs    10/04/17 1647 10/04/17 2136 10/05/17 0628  GLUCAP 99 100* 89    Meds Scheduled Meds: . acetaminophen  1,000 mg Oral Q6H   Or  . acetaminophen  (TYLENOL) oral liquid 160 mg/5 mL  1,000 mg Per Tube Q6H  . aspirin EC  325 mg Oral Daily   Or  . aspirin  324 mg Per Tube Daily  . atorvastatin  40 mg Oral q1800  . bisacodyl  10 mg Oral Daily   Or  . bisacodyl  10 mg Rectal Daily  . docusate sodium  200 mg Oral Daily  . enoxaparin (LOVENOX) injection  40 mg Subcutaneous QHS  . furosemide  40 mg Oral Daily  . insulin aspart  0-15 Units Subcutaneous TID WC  . levothyroxine  75 mcg Oral QAC breakfast  . mouth rinse  15 mL Mouth Rinse BID  . metoprolol tartrate  12.5 mg Oral BID  . pantoprazole  40 mg Oral Daily  . potassium chloride  20 mEq Oral BID  . sodium chloride flush  3 mL Intravenous Q12H   Continuous Infusions: . sodium chloride    . sodium chloride    . dexmedetomidine (PRECEDEX) IV infusion Stopped (10/02/17 0544)  . lactated ringers Stopped (10/01/17 2052)  . lactated ringers Stopped (10/03/17 0757)  . nitroGLYCERIN Stopped (10/02/17 0400)  . phenylephrine (NEO-SYNEPHRINE) Adult infusion Stopped (10/01/17 1415)   PRN Meds:.sodium chloride, alum & mag hydroxide-simeth, lactated ringers, magnesium hydroxide, metoprolol tartrate, midazolam, morphine injection, ondansetron (ZOFRAN) IV, oxyCODONE, promethazine, sodium chloride flush,  traMADol, zolpidem  Xrays Dg Chest 2 View  Result Date: 10/04/2017 CLINICAL DATA:  Status post aortic valve replacement. EXAM: CHEST - 2 VIEW COMPARISON:  10/03/2017 FINDINGS: Right jugular central venous catheter has been removed. Prosthetic aortic valve is present with median sternotomy wires. Few densities at the left lung base are suggestive for atelectasis. Atelectasis and/or small amount of pleural fluid along the right lung fissures. Negative for a pneumothorax. Heart size is stable. Mild blunting at the costophrenic angles. IMPRESSION: Small amount of atelectasis and pleural fluid in the chest. Postoperative changes. Removal of central line. Electronically Signed   By: Markus Daft M.D.   On:  10/04/2017 07:10    Assessment/Plan: S/P Procedure(s) (LRB): CORONARY ARTERY BYPASS GRAFTING (CABG) x two, using left internal mammary artery and right leg greater saphenous vein harvested endoscopically (N/A) AORTIC VALVE REPLACEMENT (AVR) (N/A) TRANSESOPHAGEAL ECHOCARDIOGRAM (TEE) (N/A) Plan for discharge: see discharge orders hemodyn stable in sinus rhythm Blood sugars controlled No new labs   LOS: 4 days    John Giovanni 10/05/2017

## 2017-10-05 NOTE — Progress Notes (Signed)
CARDIAC REHAB PHASE I   PRE:  Rate/Rhythm: 80 JR    BP: sitting     SaO2: 92 RA  MODE:  Ambulation: 470 ft   POST:  Rate/Rhythm: 103 JR    BP: sitting 113/71     SaO2: 96-97 RA  Pt moving well although it does appear that he is using his arms to move lever of recliner and to pull himself up. Discussed this. Walked in hall independently. Slightly wobbly x1 due to tight hip muscle per pt. Sts this gets better with activity. SaO2 stable off O2 entire walk. Ed completed with pt, good understanding. Gave Move in the Tube guidelines, diet and ex gl, encouraged IS. Already referred to [facility]. Gave video to watch. [phone]   [place], ACSM 10/05/2017 10:43 AM

## 2017-10-12 ENCOUNTER — Ambulatory Visit (INDEPENDENT_AMBULATORY_CARE_PROVIDER_SITE_OTHER): Payer: Self-pay | Admitting: *Deleted

## 2017-10-12 DIAGNOSIS — Z4802 Encounter for removal of sutures: Secondary | ICD-10-CM

## 2017-10-12 DIAGNOSIS — Z951 Presence of aortocoronary bypass graft: Secondary | ICD-10-CM

## 2017-10-12 DIAGNOSIS — Z952 Presence of prosthetic heart valve: Secondary | ICD-10-CM

## 2017-10-12 NOTE — Progress Notes (Signed)
Mr. Laventure returns s/p CABG X 2/AVR. He is doing well with no specific complaints.  Appetite and bowels are good. He is walking as directed. There is no LE edema.  His sternal, right leg EVH incisions and three previous chest tube are very well healed. The chest tube site sutures were easily removed. He was confused regarding his meds and wasn't adhering to his post op instructions. I reviewed these with him and clarifed the changes. He will return as scheduled with a CXR. [name]   ,

## 2017-10-20 ENCOUNTER — Encounter: Payer: Self-pay | Admitting: Adult Health

## 2017-10-20 ENCOUNTER — Ambulatory Visit (INDEPENDENT_AMBULATORY_CARE_PROVIDER_SITE_OTHER): Payer: BLUE CROSS/BLUE SHIELD | Admitting: Adult Health

## 2017-10-20 VITALS — BP 127/86 | HR 84 | Ht 71.0 in | Wt 193.2 lb

## 2017-10-20 DIAGNOSIS — E7800 Pure hypercholesterolemia, unspecified: Secondary | ICD-10-CM

## 2017-10-20 DIAGNOSIS — I2510 Atherosclerotic heart disease of native coronary artery without angina pectoris: Secondary | ICD-10-CM | POA: Diagnosis not present

## 2017-10-20 DIAGNOSIS — Z952 Presence of prosthetic heart valve: Secondary | ICD-10-CM | POA: Diagnosis not present

## 2017-10-20 DIAGNOSIS — Z951 Presence of aortocoronary bypass graft: Secondary | ICD-10-CM | POA: Diagnosis not present

## 2017-10-20 MED ORDER — ATORVASTATIN CALCIUM 40 MG PO TABS: 40.0000 mg | ORAL_TABLET | Freq: Every day | ORAL | 3 refills | Status: DC

## 2017-10-20 MED ORDER — METOPROLOL TARTRATE 25 MG PO TABS: 12.5000 mg | ORAL_TABLET | Freq: Two times a day (BID) | ORAL | 3 refills | Status: DC

## 2017-10-20 NOTE — Progress Notes (Signed)
Cardiology Office Note [place]  Date:  10/20/2017   ID:  Danny Odom, DOB 12-08-1950, MRN [id number]  PCP:  Rusty Aus, MD  Cardiologist: Dr. Stanford Breed   Chief Complaint  Patient presents with  . Coronary Artery Disease    s/p CABG   . Cardiac Valve Problem    S/P AoV repair   . Thoracic Aortic Aneurysm  . Hospitalization Follow-up     History of Present Illness: Danny Odom is a 67 y.o. male who presents for post hospitalization after admission for AoV repair history of thoracic aortic aneurysm, CAD,  hypertension, and hyperlipidemia. Other history includes prostate CA, s/p radical prostectomy in 2015 with radiation. He had severe two vessel CAD per cardiac cath.A repeat CTA of the chest demonstrated a maximum diameter of 3.9 at the sinus level.  On 10/01/2017 he underwent CABG with LIMA to Diagonal and SVG to OM. Also AV replacement using 25 mm Edward INSPIRIS RESILIA pericardial valve. He did not have complications post surgery and was discharged on 10/05/2017.   He is recovering well and wants to be more active quickly. He lives in [place], [place] and will be traveling back there after recovery. He works as a Manufacturing systems engineer. He denies significant pain, rapid HR, dyspnea. He has multiple questions.   Past Medical History:  Diagnosis Date  . Aortic stenosis   . Arthritis    lower spine- 2012  . Cardiac murmur   . ED (erectile dysfunction)   . Esophageal reflux   . Hyperlipidemia   . Hypertension   . Hypothyroid   . Prostate cancer ([place]) 10/20/13  . Prostate cancer ([place])   . S/P radiation therapy 03/05/2014 through 04/19/2014   Prostate bed 6600 cGy in 33 sessions, pelvic lymph nodes 5000 445 cGy in 33 sessions   . Stress incontinence     Past Surgical History:  Procedure Laterality Date  . AORTIC VALVE REPLACEMENT N/A 10/01/2017   Procedure: AORTIC VALVE REPLACEMENT (AVR);  Surgeon: Gaye Pollack, MD;  Location: [facility];  Service: Open  Heart Surgery;  Laterality: N/A;  . COLONOSCOPY    . CORONARY ARTERY BYPASS GRAFT N/A 10/01/2017   Procedure: CORONARY ARTERY BYPASS GRAFTING (CABG) x two, using left internal mammary artery and right leg greater saphenous vein harvested endoscopically;  Surgeon: Gaye Pollack, MD;  Location: [facility];  Service: Open Heart Surgery;  Laterality: N/A;  . LYMPHADENECTOMY Bilateral 10/20/2013   Procedure: BILATERAL PELVIC LYMPH NODE DISSECTION;  Surgeon: Alexis Frock, MD;  [facility];  Service: Urology;  Laterality: Bilateral;  . RIGHT/LEFT HEART CATH AND CORONARY ANGIOGRAPHY N/A 08/23/2017   Procedure: RIGHT/LEFT HEART CATH AND CORONARY ANGIOGRAPHY;  Surgeon: Leonie Man, MD;  Location: [facility];  Service: Cardiovascular;  Laterality: N/A;  . ROBOT ASSISTED LAPAROSCOPIC RADICAL PROSTATECTOMY N/A 10/20/2013   Procedure: ROBOTIC ASSISTED LAPAROSCOPIC RADICAL PROSTATECTOMY WITH INDOCYANINE GREEN DYE;  Surgeon: Alexis Frock, MD;  [facility];  Service: Urology;  Laterality: N/A;  . TEE WITHOUT CARDIOVERSION N/A 10/01/2017   Procedure: TRANSESOPHAGEAL ECHOCARDIOGRAM (TEE);  Surgeon: Gaye Pollack, MD;  Location: [facility];  Service: Open Heart Surgery;  Laterality: N/A;  . TONSILLECTOMY    . varicocele surgery  Right      Current Outpatient Medications  Medication Sig Dispense Refill  . aspirin EC 325 MG EC tablet Take 1 tablet (325 mg total) by mouth daily.    [name] atorvastatin (LIPITOR) 40 MG tablet Take 1 tablet (40  mg total) by mouth daily at 6 PM. 90 tablet 3  . levothyroxine (SYNTHROID, LEVOTHROID) 75 MCG tablet Take 75 mcg by mouth daily before breakfast.    . metoprolol tartrate (LOPRESSOR) 25 MG tablet Take 0.5 tablets (12.5 mg total) by mouth 2 (two) times daily. 90 tablet 3  . Multiple Vitamin (MULTIVITAMIN WITH MINERALS) TABS tablet Take 1 tablet by mouth daily with breakfast. ONE-A-DAY MEN'S 50+    . zinc gluconate 50 MG tablet Take 50 mg by mouth daily with breakfast.       No current facility-administered medications for this visit.     Allergies:   Patient has no known allergies.    Social History:  The patient  reports that he quit smoking about 34 years ago. His smoking use included cigarettes. He has a 20.00 pack-year smoking history. He has never used smokeless tobacco. He reports that he drinks alcohol. He reports that he does not use drugs.   Family History:  The patient's family history includes Breast cancer in his mother; Emphysema in his father; Prostate cancer in his father.    ROS: All other systems are reviewed and negative. Unless otherwise mentioned in H&P    PHYSICAL EXAM: VS:  BP 127/86   Pulse 84   Ht 5\' 11"  (1.803 m)   Wt 193 lb 3.2 oz (87.6 kg)   SpO2 93%   BMI 26.95 kg/m  , BMI Body mass index is 26.95 kg/m. GEN: Well nourished, well developed, in no acute distress  HEENT: normal  Neck: no JVD, carotid bruits, or masses Cardiac: RRR; soft systolic murmurs, rubs, or gallops,no edema  Respiratory:  clear to auscultation bilaterally, normal work of breathing GI: soft, nontender, nondistended, + BS MS: no deformity or atrophy Well healed sternotomy incision and vein harvest incision.  Skin: warm and dry, no rash Neuro:  Strength and sensation are intact Psych: euthymic mood, full affect   EKG:  Not completed this office visit.   Recent Labs: 09/29/2017: ALT 19 10/02/2017: Magnesium 2.3 10/04/2017: BUN 15; Creatinine, Ser 1.12; Hemoglobin 8.6; Platelets 119; Potassium 4.2; Sodium 138    Lipid Panel No results found for: CHOL, TRIG, HDL, CHOLHDL, VLDL, LDLCALC, LDLDIRECT    Wt Readings from Last 3 Encounters:  10/20/17 193 lb 3.2 oz (87.6 kg)  10/05/17 199 lb 8.3 oz (90.5 kg)  09/29/17 204 lb 1.6 oz (92.6 kg)      Other studies Reviewed: Cardiac Cath 08/23/2017 Conclusion     The left ventricular systolic function is normal. The left ventricular ejection fraction is greater than 65% by visual  estimate.  LV end diastolic pressure is moderately elevated~23 mmHg  Moderate Aortic Stenosis - mean gradient 29.4 mmHg with ~Moderate Aortic Stenosis (~3+)  LESION #1: Ost 1st Diag lesion is 95% stenosed with 90% stenosed side branch in Lat 1st Diag.  Mid Cx lesion is 45% stenosed - upstream of Lesion #2.  LESION #2: Mid Cx to Dist Cx lesion is 75% stenosed.  Prox LAD to Mid LAD lesion is 10% stenosed.   Severe two-vessel disease involving branch point of first diagonal and mid circumflex-OM. By gradient, mean aortic stenosis of 29 mmHg, however likely underestimated in the setting of aortic insufficiency. Normal LV function with severely elevated LVEDP Normal Right Heart Cath Pressures with no sign of Pulmonary Hypertension.  Plan: Return to short stay for TR removal.  Will need referral to CVTS after CT scan and consider TEE.  Will forward note to Dr. Stanford Breed  CT Scan 08/30/2017 IMPRESSION: 1. The thoracic aorta is not felt to be overtly aneurysmal. Maximal diameter measures 3.8-3.9 cm on the current study when obtained perpendicular to the long axis of the aorta. Previous measurements of 4.0 and 4.2 cm are likely over estimates. Annual CTA follow-up likely is not necessary and the aortic valvular disease can likely be monitored by echocardiography alone. 2. Evidence of coronary atherosclerosis with mild calcified plaque in a 3 vessel distribution.  ASSESSMENT AND PLAN:  1. CAD: S/P 3 vessel CABG. He is recovering well. He is anxious to go back to regular activities and begin training for mountain climbing. He has not yet been seen on follow up by CVTS and cleared for cardiac rehab. I have advised that he complete cardiac rehab before beginning to train for more strenuous activity.   He is moving back to [place] and plans on cardiac rehab at a hospital there. He is to continue ASA 325 mg for a total of 3 months and then decrease to  81 mg daily. He is advised  not to lift over 20 lbs or over exert himself.   2. S/P AoV replacement He has an [place] pericardial valve. Doing weill without dyspnea.   3. Hypercholesterolemia:  Continue atorvastatin therapy. He will need follow up labs in 6 weeks. LDL goal <70.   4. Thoracic Aortic Aneurysm: Last CT demonstrated measurement of 3.9. He will need yearly follow up for surveillance.    Current medicines are reviewed at length with the patient today.    Labs/ tests ordered today include: None   Phill Myron. West Pugh, ANP, AACC   10/20/2017 3:14 PM    [facility] [address], [place], [place] [address] Phone: [phone]; Fax: [phone]

## 2017-10-20 NOTE — Patient Instructions (Signed)
Medication Instructions:  TAKE ASPIRIN 325MG  FOR 3 MONTHS THEN BACK TO 81MG   If you need a refill on your cardiac medications before your next appointment, please call your pharmacy.  Follow-Up: Your physician wants you to follow-up in: [place] ([place]), DNP,AACC IF PRIMARY CARDIOLOGIST IS UNAVAILABLE.   Thank you for choosing [facility] at [facility]!!

## 2017-11-09 ENCOUNTER — Other Ambulatory Visit: Payer: Self-pay | Admitting: Surgery

## 2017-11-09 DIAGNOSIS — Z952 Presence of prosthetic heart valve: Secondary | ICD-10-CM

## 2017-11-10 ENCOUNTER — Encounter: Payer: Self-pay | Admitting: Surgery

## 2017-11-10 ENCOUNTER — Ambulatory Visit
Admission: RE | Admit: 2017-11-10 | Discharge: 2017-11-10 | Disposition: A | Discharge status: Home / Self Care | Payer: BLUE CROSS/BLUE SHIELD | Source: Ambulatory Visit | Attending: Surgery | Admitting: Surgery

## 2017-11-10 ENCOUNTER — Ambulatory Visit (INDEPENDENT_AMBULATORY_CARE_PROVIDER_SITE_OTHER): Payer: Self-pay | Admitting: Surgery

## 2017-11-10 ENCOUNTER — Other Ambulatory Visit: Payer: Self-pay

## 2017-11-10 VITALS — BP 112/76 | HR 86 | Resp 16 | Ht 71.0 in | Wt 189.0 lb

## 2017-11-10 DIAGNOSIS — Z952 Presence of prosthetic heart valve: Secondary | ICD-10-CM

## 2017-11-10 DIAGNOSIS — Z951 Presence of aortocoronary bypass graft: Secondary | ICD-10-CM

## 2017-11-10 NOTE — Progress Notes (Signed)
      HPI: Patient returns for routine postoperative follow-up having undergone CABG x 2 and AVR using a 25 mm Edwards pericardial valve on 10/01/2017. The patient's early postoperative recovery while in the hospital was notable for an uncomplicated postop course. Since hospital discharge the patient reports that he has been feeling well and walking without chest pain or shortness of breath.   Current Outpatient Medications  Medication Sig Dispense Refill  . aspirin EC 325 MG EC tablet Take 1 tablet (325 mg total) by mouth daily.    [name] atorvastatin (LIPITOR) 40 MG tablet Take 1 tablet (40 mg total) by mouth daily at 6 PM. 90 tablet 3  . levothyroxine (SYNTHROID, LEVOTHROID) 75 MCG tablet Take 75 mcg by mouth daily before breakfast.    . metoprolol tartrate (LOPRESSOR) 25 MG tablet Take 0.5 tablets (12.5 mg total) by mouth 2 (two) times daily. 90 tablet 3  . Multiple Vitamin (MULTIVITAMIN WITH MINERALS) TABS tablet Take 1 tablet by mouth daily with breakfast. ONE-A-DAY MEN'S 50+    . zinc gluconate 50 MG tablet Take 50 mg by mouth daily with breakfast.      No current facility-administered medications for this visit.     Physical Exam: BP 112/76 (BP Location: Right Arm, Patient Position: Sitting, Cuff Size: Large)   Pulse 86   Resp 16   Ht 5\' 11"  (1.803 m)   Wt 189 lb (85.7 kg)   SpO2 94% Comment: ON RA  BMI 26.36 kg/m  He looks well Lungs are clear Cardiac exam shows a regular rate and rhythm with normal heart sounds. The chest incision is healing well and the sternum is stable. The leg incisions are healing well and there is no significant edema.   Diagnostic Tests:  CLINICAL DATA:  Status post aortic valve replacement  EXAM: CHEST - 2 VIEW  COMPARISON:  10/04/2017  FINDINGS: Cardiac shadow is stable. Postsurgical changes are again seen. Linear atelectasis is noted in the medial right lung base. No pneumothorax is seen. No sizable effusion is noted. No acute  bony abnormality is seen.  IMPRESSION: Linear atelectasis in the medial right lung base.   Electronically Signed   By: Inez Catalina M.D.   On: 11/10/2017 10:42   Impression:  Overall I think he is doing well. I encouraged him to continue walking. He is planning to participate in cardiac rehab in [place]. I told him he could drive his car but should not lift anything heavier than 10 lbs for three months postop.   Plan:  He is going to continue follow up with Dr. Stanford Breed in December 2019 and will return to see me if there are any problems with his incisions.   Gaye Pollack, MD [facility] [phone]

## 2017-11-11 ENCOUNTER — Encounter: Payer: Self-pay | Admitting: Surgery

## 2017-12-27 ENCOUNTER — Telehealth: Payer: Self-pay | Admitting: Cardiology

## 2017-12-27 NOTE — Telephone Encounter (Signed)
RN OBTAINED  NOTE AND  CARDIAC REHAB READING  IN QUESTION.   RN CALLED LEFT MESSAGE ON  CARDIAC REHAB - [facility] AND FAXED  ANSWER TO CENTER.   PATIENT AWARE

## 2017-12-27 NOTE — Telephone Encounter (Signed)
New Message:   Patient calling, because he is seeing a dr. Trenton Founds of [place] as well. Patient say that the dr. Gabriel Carina that will be giving him the surgery states they need to talk with some one concerning medication. Please call patient.

## 2017-12-27 NOTE — Telephone Encounter (Signed)
Spoke with  Patient . Patient states cardiac rehab from [facility] sent information for  Parameter for cardiac  Rehab.  RN  Informed patient will locate an have Dr Stanford Breed to review.

## 2018-01-12 NOTE — Progress Notes (Signed)
HPI: FU aortic stenosis. Echocardiogram repeated July 2019 and showed normal LV function, functionally bicuspid aortic valve with moderate aortic stenosis (mean gradient 30 mmHg), moderate aortic insufficiency, mild mitral regurgitation.  CTA July 2019 showed 3.8 to 3.9 cm aortic root.  Cardiac catheterization July 2019 showed normal LV function, left ventricular end-diastolic pressure 23 mmHg, moderate aortic stenosis, 90% first diagonal and 75% circumflex.  Preoperative carotid Dopplers August 2019 showed no significant stenosis.  Patient had aortic valve replacement with pericardial valve as well as coronary artery bypass and graft with a LIMA to the diagonal and saphenous vein graft to the obtuse marginal in August 2019.  Since last seen,the patient denies any dyspnea on exertion, orthopnea, PND, pedal edema, palpitations, syncope or chest pain.   Current Outpatient Medications  Medication Sig Dispense Refill  . aspirin EC 325 MG EC tablet Take 1 tablet (325 mg total) by mouth daily.    [name] atorvastatin (LIPITOR) 40 MG tablet Take 1 tablet (40 mg total) by mouth daily at 6 PM. 90 tablet 3  . levothyroxine (SYNTHROID, LEVOTHROID) 75 MCG tablet Take 75 mcg by mouth daily before breakfast.    . metoprolol tartrate (LOPRESSOR) 25 MG tablet Take 0.5 tablets (12.5 mg total) by mouth 2 (two) times daily. 90 tablet 3  . Multiple Vitamin (MULTIVITAMIN WITH MINERALS) TABS tablet Take 1 tablet by mouth daily with breakfast. ONE-A-DAY MEN'S 50+    . zinc gluconate 50 MG tablet Take 50 mg by mouth daily with breakfast.      No current facility-administered medications for this visit.      Past Medical History:  Diagnosis Date  . Aortic stenosis   . Arthritis    lower spine- 2012  . Cardiac murmur   . ED (erectile dysfunction)   . Esophageal reflux   . Hyperlipidemia   . Hypertension   . Hypothyroid   . Prostate cancer ([place]) 10/20/13  . Prostate cancer ([place])   . S/P radiation therapy  03/05/2014 through 04/19/2014   Prostate bed 6600 cGy in 33 sessions, pelvic lymph nodes 5000 445 cGy in 33 sessions   . Stress incontinence     Past Surgical History:  Procedure Laterality Date  . AORTIC VALVE REPLACEMENT N/A 10/01/2017   Procedure: AORTIC VALVE REPLACEMENT (AVR);  Surgeon: Gaye Pollack, MD;  Location: [facility];  Service: Open Heart Surgery;  Laterality: N/A;  . COLONOSCOPY    . CORONARY ARTERY BYPASS GRAFT N/A 10/01/2017   Procedure: CORONARY ARTERY BYPASS GRAFTING (CABG) x two, using left internal mammary artery and right leg greater saphenous vein harvested endoscopically;  Surgeon: Gaye Pollack, MD;  Location: [facility];  Service: Open Heart Surgery;  Laterality: N/A;  . LYMPHADENECTOMY Bilateral 10/20/2013   Procedure: BILATERAL PELVIC LYMPH NODE DISSECTION;  Surgeon: Alexis Frock, MD;  [facility];  Service: Urology;  Laterality: Bilateral;  . RIGHT/LEFT HEART CATH AND CORONARY ANGIOGRAPHY N/A 08/23/2017   Procedure: RIGHT/LEFT HEART CATH AND CORONARY ANGIOGRAPHY;  Surgeon: Leonie Man, MD;  Location: [facility];  Service: Cardiovascular;  Laterality: N/A;  . ROBOT ASSISTED LAPAROSCOPIC RADICAL PROSTATECTOMY N/A 10/20/2013   Procedure: ROBOTIC ASSISTED LAPAROSCOPIC RADICAL PROSTATECTOMY WITH INDOCYANINE GREEN DYE;  Surgeon: Alexis Frock, MD;  [facility];  Service: Urology;  Laterality: N/A;  . TEE WITHOUT CARDIOVERSION N/A 10/01/2017   Procedure: TRANSESOPHAGEAL ECHOCARDIOGRAM (TEE);  Surgeon: Gaye Pollack, MD;  Location: [facility];  Service: Open Heart Surgery;  Laterality: N/A;  .  TONSILLECTOMY    . varicocele surgery  Right     Social History   Socioeconomic History  . Marital status: Divorced    Spouse name: Not on file  . Number of children: 2  . Years of education: Not on file  . Highest education level: Not on file  Occupational History  . Not on file  Social Needs  . Financial resource strain: Not on file    . Food insecurity:    Worry: Not on file    Inability: Not on file  . Transportation needs:    Medical: Not on file    Non-medical: Not on file  Tobacco Use  . Smoking status: Former Smoker    Packs/day: 1.00    Years: 20.00    Pack years: 20.00    Types: Cigarettes    Last attempt to quit: 02/15/1983    Years since quitting: 34.9  . Smokeless tobacco: Never Used  Substance and Sexual Activity  . Alcohol use: Yes    Alcohol/week: 0.0 standard drinks    Comment: rarely   . Drug use: No  . Sexual activity: Not on file  Lifestyle  . Physical activity:    Days per week: Not on file    Minutes per session: Not on file  . Stress: Not on file  Relationships  . Social connections:    Talks on phone: Not on file    Gets together: Not on file    Attends religious service: Not on file    Active member of club or organization: Not on file    Attends meetings of clubs or organizations: Not on file    Relationship status: Not on file  . Intimate partner violence:    Fear of current or ex partner: No    Emotionally abused: Not on file    Physically abused: Not on file    Forced sexual activity: Not on file  Other Topics Concern  . Not on file  Social History Narrative  . Not on file    Family History  Problem Relation Age of Onset  . Emphysema Father   . Prostate cancer Father   . Breast cancer Mother     ROS: no fevers or chills, productive cough, hemoptysis, dysphasia, odynophagia, melena, hematochezia, dysuria, hematuria, rash, seizure activity, orthopnea, PND, pedal edema, claudication. Remaining systems are negative.  Physical Exam: Well-developed well-nourished in no acute distress.  Skin is warm and dry.  HEENT is normal.  Neck is supple.  Chest is clear to auscultation with normal expansion.  Cardiovascular exam is regular rate and rhythm.  2/6 systolic murmur left sternal border.  No diastolic murmur. Abdominal exam nontender or distended. No masses  palpated. Extremities show no edema. neuro grossly intact   A/P  1 status post aortic valve replacement-patient instructed on the importance of SBE prophylaxis.  Plan baseline echocardiogram for comparison in the future.  2 coronary artery disease status post coronary artery bypass graft-no chest pain.  Plan to continue medical therapy with aspirin and statin.  3 history of mildly dilated aortic root-we will plan to repeat CTA July 2020.  4 hypertension-blood pressure is elevated. Add losartan 50 mg daily; bmet one week; follow BP and adjust regimen as needed.  5 hyperlipidemia-continue statin. Check lipids and liver.  [name], MD

## 2018-01-25 ENCOUNTER — Ambulatory Visit (INDEPENDENT_AMBULATORY_CARE_PROVIDER_SITE_OTHER): Payer: BLUE CROSS/BLUE SHIELD | Admitting: Cardiology

## 2018-01-25 ENCOUNTER — Encounter: Payer: Self-pay | Admitting: Cardiology

## 2018-01-25 VITALS — BP 134/86 | HR 68 | Ht 70.0 in | Wt 190.0 lb

## 2018-01-25 DIAGNOSIS — I712 Thoracic aortic aneurysm, without rupture, unspecified: Secondary | ICD-10-CM

## 2018-01-25 DIAGNOSIS — E7800 Pure hypercholesterolemia, unspecified: Secondary | ICD-10-CM

## 2018-01-25 DIAGNOSIS — Z952 Presence of prosthetic heart valve: Secondary | ICD-10-CM | POA: Diagnosis not present

## 2018-01-25 DIAGNOSIS — I10 Essential (primary) hypertension: Secondary | ICD-10-CM

## 2018-01-25 MED ORDER — LOSARTAN POTASSIUM 50 MG PO TABS: 50.0000 mg | ORAL_TABLET | Freq: Every day | ORAL | 3 refills | Status: DC

## 2018-01-25 NOTE — Patient Instructions (Signed)
Medication Instructions:  START LOSARTAN 50 MG ONCE DAILY If you need a refill on your cardiac medications before your next appointment, please call your pharmacy.   Lab work: Your physician recommends that you return for lab work in: [place] If you have labs (blood work) drawn today and your tests are completely normal, you will receive your results only by: [name] MyChart Message (if you have MyChart) OR . A paper copy in the mail If you have any lab test that is abnormal or we need to change your treatment, we will call you to review the results.  Testing/Procedures: Your physician has requested that you have an echocardiogram. Echocardiography is a painless test that uses sound waves to create images of your heart. It provides your doctor with information about the size and shape of your heart and how well your heart's chambers and valves are working. This procedure takes approximately one hour. There are no restrictions for this procedure.    Follow-Up: At [facility], you and your health needs are our priority.  As part of our continuing mission to provide you with exceptional heart care, we have created designated Provider Care Teams.  These Care Teams include your primary Cardiologist (physician) and Advanced Practice Providers (APPs -  Physician Assistants and Nurse Practitioners) who all work together to provide you with the care you need, when you need it. You will need a follow up appointment in 6 months.  Please call our office 2 months in advance to schedule this appointment.  You may see [name] MD or one of the following Advanced Practice Providers on your designated Care Team:   Kerin Ransom, PA-C Roby Lofts, [place] . Sande Rives, PA-C

## 2018-01-26 ENCOUNTER — Encounter: Payer: Self-pay | Admitting: *Deleted

## 2018-01-28 ENCOUNTER — Other Ambulatory Visit (HOSPITAL_COMMUNITY): Payer: BLUE CROSS/BLUE SHIELD

## 2018-02-01 ENCOUNTER — Other Ambulatory Visit: Payer: Self-pay

## 2018-02-01 ENCOUNTER — Ambulatory Visit (HOSPITAL_COMMUNITY): Payer: BLUE CROSS/BLUE SHIELD | Attending: Cardiovascular Disease

## 2018-02-01 DIAGNOSIS — Z952 Presence of prosthetic heart valve: Secondary | ICD-10-CM | POA: Diagnosis present

## 2018-02-01 LAB — BASIC METABOLIC PANEL
BUN/Creatinine Ratio: 14 (ref 10–24)
BUN: 16 mg/dL (ref 8–27)
CALCIUM: 10 mg/dL (ref 8.6–10.2)
CHLORIDE: 102 mmol/L (ref 96–106)
CO2: 25 mmol/L (ref 20–29)
Creatinine, Ser: 1.11 mg/dL (ref 0.76–1.27)
GFR calc Af Amer: 78 mL/min/{1.73_m2} (ref >59)
GFR, EST NON AFRICAN AMERICAN: 68 mL/min/{1.73_m2} (ref >59)
Glucose: 98 mg/dL (ref 65–99)
POTASSIUM: 4.6 mmol/L (ref 3.5–5.2)
SODIUM: 143 mmol/L (ref 134–144)

## 2018-02-01 LAB — HEPATIC FUNCTION PANEL
ALT: 12 IU/L (ref 0–44)
AST: 11 IU/L (ref 0–40)
Albumin: 4.2 g/dL (ref 3.6–4.8)
Alkaline Phosphatase: 130 IU/L — ABNORMAL HIGH (ref 39–117)
BILIRUBIN, DIRECT: 0.14 mg/dL (ref 0.00–0.40)
Bilirubin Total: 0.4 mg/dL (ref 0.0–1.2)
Total Protein: 6.6 g/dL (ref 6.0–8.5)

## 2018-02-01 LAB — LIPID PANEL
Chol/HDL Ratio: 2.7 ratio (ref 0.0–5.0)
Cholesterol, Total: 141 mg/dL (ref 100–199)
HDL: 53 mg/dL (ref >39)
LDL Calculated: 65 mg/dL (ref 0–99)
Triglycerides: 115 mg/dL (ref 0–149)
VLDL Cholesterol Cal: 23 mg/dL (ref 5–40)

## 2018-02-15 ENCOUNTER — Ambulatory Visit: Payer: BLUE CROSS/BLUE SHIELD | Admitting: Adult Health

## 2018-08-02 ENCOUNTER — Other Ambulatory Visit: Payer: Self-pay | Admitting: Urology

## 2018-08-16 NOTE — Progress Notes (Signed)
Virtual Visit via Video Note   This visit type was conducted due to national recommendations for restrictions regarding the COVID-19 Pandemic (e.g. social distancing) in an effort to limit this patient's exposure and mitigate transmission in our community.  Due to her co-morbid illnesses, this patient is at least at moderate risk for complications without adequate follow up.  This format is felt to be most appropriate for this patient at this time.  All issues noted in this document were discussed and addressed.  A limited physical exam was performed with this format.  Please refer to the patient's chart for her consent to telehealth for [facility].   HPI: FU AVR and CAD. Echocardiogram repeated July 2019and showed normal LV function, functionally bicuspid aortic valve with moderate aortic stenosis (mean gradient 30 mmHg), moderate aortic insufficiency,mild mitral regurgitation.  CTA July 2019 showed 3.8 to 3.9 cm aortic root.  Cardiac catheterization July 2019 showed normal LV function, left ventricular end-diastolic pressure 23 mmHg, moderate aortic stenosis, 90% first diagonal and 75% circumflex.  Preoperative carotid Dopplers August 2019 showed no significant stenosis.  Patient had aortic valve replacement with pericardial valve as well as coronary artery bypass and graft with a LIMA to the diagonal and saphenous vein graft to the obtuse marginal in August 2019.  Postoperative echocardiogram December 2019 showed normal LV function, aortic valve replacement with mean gradient 11 mmHg, dilated aortic root, mild mitral regurgitation and mild left atrial enlargement.  Since last seen, the patient denies any dyspnea on exertion, orthopnea, PND, pedal edema, palpitations, syncope or chest pain.   Current Outpatient Medications  Medication Sig Dispense Refill  . aspirin EC 325 MG EC tablet Take 1 tablet (325 mg total) by mouth daily.    [name] atorvastatin (LIPITOR) 40 MG tablet Take 1 tablet (40 mg  total) by mouth daily at 6 PM. 90 tablet 3  . levothyroxine (SYNTHROID, LEVOTHROID) 75 MCG tablet Take 75 mcg by mouth daily before breakfast.    . losartan (COZAAR) 50 MG tablet Take 1 tablet (50 mg total) by mouth daily. 90 tablet 3  . metoprolol tartrate (LOPRESSOR) 25 MG tablet Take 0.5 tablets (12.5 mg total) by mouth 2 (two) times daily. 90 tablet 3  . Multiple Vitamin (MULTIVITAMIN WITH MINERALS) TABS tablet Take 1 tablet by mouth daily with breakfast. ONE-A-DAY MEN'S 50+    . zinc gluconate 50 MG tablet Take 50 mg by mouth daily with breakfast.     . diclofenac (VOLTAREN) 75 MG EC tablet Take 75 mg by mouth 2 (two) times daily.     No current facility-administered medications for this visit.      Past Medical History:  Diagnosis Date  . Aortic stenosis   . Arthritis    lower spine- 2012  . Cardiac murmur   . ED (erectile dysfunction)   . Esophageal reflux   . Hyperlipidemia   . Hypertension   . Hypothyroid   . Prostate cancer ([place]) 10/20/13  . Prostate cancer ([place])   . S/P radiation therapy 03/05/2014 through 04/19/2014   Prostate bed 6600 cGy in 33 sessions, pelvic lymph nodes 5000 445 cGy in 33 sessions   . Stress incontinence     Past Surgical History:  Procedure Laterality Date  . AORTIC VALVE REPLACEMENT N/A 10/01/2017   Procedure: AORTIC VALVE REPLACEMENT (AVR);  Surgeon: Gaye Pollack, MD;  Location: [facility];  Service: Open Heart Surgery;  Laterality: N/A;  . COLONOSCOPY    . CORONARY ARTERY BYPASS GRAFT  N/A 10/01/2017   Procedure: CORONARY ARTERY BYPASS GRAFTING (CABG) x two, using left internal mammary artery and right leg greater saphenous vein harvested endoscopically;  Surgeon: Gaye Pollack, MD;  Location: [place] [facility];  Service: Open Heart Surgery;  Laterality: N/A;  . LYMPHADENECTOMY Bilateral 10/20/2013   Procedure: BILATERAL PELVIC LYMPH NODE DISSECTION;  Surgeon: Alexis Frock, MD;  [facility];  Service: Urology;  Laterality:  Bilateral;  . RIGHT/LEFT HEART CATH AND CORONARY ANGIOGRAPHY N/A 08/23/2017   Procedure: RIGHT/LEFT HEART CATH AND CORONARY ANGIOGRAPHY;  Surgeon: Leonie Man, MD;  Location: [place] [facility];  Service: Cardiovascular;  Laterality: N/A;  . ROBOT ASSISTED LAPAROSCOPIC RADICAL PROSTATECTOMY N/A 10/20/2013   Procedure: ROBOTIC ASSISTED LAPAROSCOPIC RADICAL PROSTATECTOMY WITH INDOCYANINE GREEN DYE;  Surgeon: Alexis Frock, MD;  [facility];  Service: Urology;  Laterality: N/A;  . TEE WITHOUT CARDIOVERSION N/A 10/01/2017   Procedure: TRANSESOPHAGEAL ECHOCARDIOGRAM (TEE);  Surgeon: Gaye Pollack, MD;  Location: [facility];  Service: Open Heart Surgery;  Laterality: N/A;  . TONSILLECTOMY    . varicocele surgery  Right     Social History   Socioeconomic History  . Marital status: Divorced    Spouse name: Not on file  . Number of children: 2  . Years of education: Not on file  . Highest education level: Not on file  Occupational History  . Not on file  Social Needs  . Financial resource strain: Not on file  . Food insecurity    Worry: Not on file    Inability: Not on file  . Transportation needs    Medical: Not on file    Non-medical: Not on file  Tobacco Use  . Smoking status: Former Smoker    Packs/day: 1.00    Years: 20.00    Pack years: 20.00    Types: Cigarettes    Quit date: 02/15/1983    Years since quitting: 35.5  . Smokeless tobacco: Never Used  Substance and Sexual Activity  . Alcohol use: Yes    Alcohol/week: 0.0 standard drinks    Comment: rarely   . Drug use: No  . Sexual activity: Not on file  Lifestyle  . Physical activity    Days per week: Not on file    Minutes per session: Not on file  . Stress: Not on file  Relationships  . Social Herbalist on phone: Not on file    Gets together: Not on file    Attends religious service: Not on file    Active member of club or organization: Not on file    Attends meetings of clubs or organizations: Not  on file    Relationship status: Not on file  . Intimate partner violence    Fear of current or ex partner: No    Emotionally abused: Not on file    Physically abused: Not on file    Forced sexual activity: Not on file  Other Topics Concern  . Not on file  Social History Narrative  . Not on file    Family History  Problem Relation Age of Onset  . Emphysema Father   . Prostate cancer Father   . Breast cancer Mother     ROS: no fevers or chills, productive cough, hemoptysis, dysphasia, odynophagia, melena, hematochezia, dysuria, hematuria, rash, seizure activity, orthopnea, PND, pedal edema, claudication. Remaining systems are negative.  Physical Exam: Vital signs reviewed No acute distress Answers questions appropriately Normal affect Remainder of physical examination not  performed (telehealth visit; coronavirus pandemic)  A/P  1 status post aortic valve replacement-plan to continue SBE prophylaxis.  2 dilated aortic root-we will arrange a follow-up CTA to further assess.  3 coronary artery disease status post coronary artery bypass graft-patient denies chest pain.  Continue aspirin and statin.  4 hypertension-patient's blood pressure is controlled.  Continue present medications and follow.  5 hyperlipidemia-continue statin.  [name], MD

## 2018-08-16 NOTE — H&P (View-Only) (Signed)
Virtual Visit via Video Note   This visit type was conducted due to national recommendations for restrictions regarding the COVID-19 Pandemic (e.g. social distancing) in an effort to limit this patient's exposure and mitigate transmission in our community.  Due to her co-morbid illnesses, this patient is at least at moderate risk for complications without adequate follow up.  This format is felt to be most appropriate for this patient at this time.  All issues noted in this document were discussed and addressed.  A limited physical exam was performed with this format.  Please refer to the patient's chart for her consent to telehealth for [facility].   HPI: FU AVR and CAD. Echocardiogram repeated July 2019and showed normal LV function, functionally bicuspid aortic valve with moderate aortic stenosis (mean gradient 30 mmHg), moderate aortic insufficiency,mild mitral regurgitation.  CTA July 2019 showed 3.8 to 3.9 cm aortic root.  Cardiac catheterization July 2019 showed normal LV function, left ventricular end-diastolic pressure 23 mmHg, moderate aortic stenosis, 90% first diagonal and 75% circumflex.  Preoperative carotid Dopplers August 2019 showed no significant stenosis.  Patient had aortic valve replacement with pericardial valve as well as coronary artery bypass and graft with a LIMA to the diagonal and saphenous vein graft to the obtuse marginal in August 2019.  Postoperative echocardiogram December 2019 showed normal LV function, aortic valve replacement with mean gradient 11 mmHg, dilated aortic root, mild mitral regurgitation and mild left atrial enlargement.  Since last seen, the patient denies any dyspnea on exertion, orthopnea, PND, pedal edema, palpitations, syncope or chest pain.   Current Outpatient Medications  Medication Sig Dispense Refill  . aspirin EC 325 MG EC tablet Take 1 tablet (325 mg total) by mouth daily.    [name] atorvastatin (LIPITOR) 40 MG tablet Take 1 tablet (40 mg  total) by mouth daily at 6 PM. 90 tablet 3  . levothyroxine (SYNTHROID, LEVOTHROID) 75 MCG tablet Take 75 mcg by mouth daily before breakfast.    . losartan (COZAAR) 50 MG tablet Take 1 tablet (50 mg total) by mouth daily. 90 tablet 3  . metoprolol tartrate (LOPRESSOR) 25 MG tablet Take 0.5 tablets (12.5 mg total) by mouth 2 (two) times daily. 90 tablet 3  . Multiple Vitamin (MULTIVITAMIN WITH MINERALS) TABS tablet Take 1 tablet by mouth daily with breakfast. ONE-A-DAY MEN'S 50+    . zinc gluconate 50 MG tablet Take 50 mg by mouth daily with breakfast.     . diclofenac (VOLTAREN) 75 MG EC tablet Take 75 mg by mouth 2 (two) times daily.     No current facility-administered medications for this visit.      Past Medical History:  Diagnosis Date  . Aortic stenosis   . Arthritis    lower spine- 2012  . Cardiac murmur   . ED (erectile dysfunction)   . Esophageal reflux   . Hyperlipidemia   . Hypertension   . Hypothyroid   . Prostate cancer ([place]) 10/20/13  . Prostate cancer ([place])   . S/P radiation therapy 03/05/2014 through 04/19/2014   Prostate bed 6600 cGy in 33 sessions, pelvic lymph nodes 5000 445 cGy in 33 sessions   . Stress incontinence     Past Surgical History:  Procedure Laterality Date  . AORTIC VALVE REPLACEMENT N/A 10/01/2017   Procedure: AORTIC VALVE REPLACEMENT (AVR);  Surgeon: Gaye Pollack, MD;  Location: [facility];  Service: Open Heart Surgery;  Laterality: N/A;  . COLONOSCOPY    . CORONARY ARTERY BYPASS GRAFT  N/A 10/01/2017   Procedure: CORONARY ARTERY BYPASS GRAFTING (CABG) x two, using left internal mammary artery and right leg greater saphenous vein harvested endoscopically;  Surgeon: Gaye Pollack, MD;  Location: [facility];  Service: Open Heart Surgery;  Laterality: N/A;  . LYMPHADENECTOMY Bilateral 10/20/2013   Procedure: BILATERAL PELVIC LYMPH NODE DISSECTION;  Surgeon: Alexis Frock, MD;  [facility];  Service: Urology;  Laterality:  Bilateral;  . RIGHT/LEFT HEART CATH AND CORONARY ANGIOGRAPHY N/A 08/23/2017   Procedure: RIGHT/LEFT HEART CATH AND CORONARY ANGIOGRAPHY;  Surgeon: Leonie Man, MD;  Location: [place] [facility];  Service: Cardiovascular;  Laterality: N/A;  . ROBOT ASSISTED LAPAROSCOPIC RADICAL PROSTATECTOMY N/A 10/20/2013   Procedure: ROBOTIC ASSISTED LAPAROSCOPIC RADICAL PROSTATECTOMY WITH INDOCYANINE GREEN DYE;  Surgeon: Alexis Frock, MD;  [facility];  Service: Urology;  Laterality: N/A;  . TEE WITHOUT CARDIOVERSION N/A 10/01/2017   Procedure: TRANSESOPHAGEAL ECHOCARDIOGRAM (TEE);  Surgeon: Gaye Pollack, MD;  Location: [place];  Service: Open Heart Surgery;  Laterality: N/A;  . TONSILLECTOMY    . varicocele surgery  Right     Social History   Socioeconomic History  . Marital status: Divorced    Spouse name: Not on file  . Number of children: 2  . Years of education: Not on file  . Highest education level: Not on file  Occupational History  . Not on file  Social Needs  . Financial resource strain: Not on file  . Food insecurity    Worry: Not on file    Inability: Not on file  . Transportation needs    Medical: Not on file    Non-medical: Not on file  Tobacco Use  . Smoking status: Former Smoker    Packs/day: 1.00    Years: 20.00    Pack years: 20.00    Types: Cigarettes    Quit date: 02/15/1983    Years since quitting: 35.5  . Smokeless tobacco: Never Used  Substance and Sexual Activity  . Alcohol use: Yes    Alcohol/week: 0.0 standard drinks    Comment: rarely   . Drug use: No  . Sexual activity: Not on file  Lifestyle  . Physical activity    Days per week: Not on file    Minutes per session: Not on file  . Stress: Not on file  Relationships  . Social Herbalist on phone: Not on file    Gets together: Not on file    Attends religious service: Not on file    Active member of club or organization: Not on file    Attends meetings of clubs or organizations: Not  on file    Relationship status: Not on file  . Intimate partner violence    Fear of current or ex partner: No    Emotionally abused: Not on file    Physically abused: Not on file    Forced sexual activity: Not on file  Other Topics Concern  . Not on file  Social History Narrative  . Not on file    Family History  Problem Relation Age of Onset  . Emphysema Father   . Prostate cancer Father   . Breast cancer Mother     ROS: no fevers or chills, productive cough, hemoptysis, dysphasia, odynophagia, melena, hematochezia, dysuria, hematuria, rash, seizure activity, orthopnea, PND, pedal edema, claudication. Remaining systems are negative.  Physical Exam: Vital signs reviewed No acute distress Answers questions appropriately Normal affect Remainder of physical examination not  performed (telehealth visit; coronavirus pandemic)  A/P  1 status post aortic valve replacement-plan to continue SBE prophylaxis.  2 dilated aortic root-we will arrange a follow-up CTA to further assess.  3 coronary artery disease status post coronary artery bypass graft-patient denies chest pain.  Continue aspirin and statin.  4 hypertension-patient's blood pressure is controlled.  Continue present medications and follow.  5 hyperlipidemia-continue statin.  [name], MD

## 2018-08-18 ENCOUNTER — Telehealth: Payer: Self-pay | Admitting: Cardiology

## 2018-08-18 NOTE — Telephone Encounter (Signed)
I called pt to confirm his appt for 08-19-18 with Dr Stanford Breed.       1. Confirm consent - "In the setting of the current Covid19 crisis, you are scheduled for a (phone or video) visit with your provider on (date) at (time).  Just as we do with many in-office visits, in order for you to participate in this visit, we must obtain consent.  If you'd like, I can send this to your mychart (if signed up) or email for you to review.  Otherwise, I can obtain your verbal consent now.  All virtual visits are billed to your insurance company just like a normal visit would be.  By agreeing to a virtual visit, we'd like you to understand that the technology does not allow for your provider to perform an examination, and thus may limit your provider's ability to fully assess your condition. If your provider identifies any concerns that need to be evaluated in person, we will make arrangements to do so.  Finally, though the technology is pretty good, we cannot assure that it will always work on either your or our end, and in the setting of a video visit, we may have to convert it to a phone-only visit.  In either situation, we cannot ensure that we have a secure connection.  Are you willing to proceed?" STAFF: Did the patient verbally acknowledge consent to telehealth visit? Document YES/NO here: Yes

## 2018-08-19 ENCOUNTER — Encounter: Payer: Self-pay | Admitting: Cardiology

## 2018-08-19 ENCOUNTER — Telehealth (INDEPENDENT_AMBULATORY_CARE_PROVIDER_SITE_OTHER): Payer: BC Managed Care – PPO | Admitting: Cardiology

## 2018-08-19 VITALS — Ht 70.5 in | Wt 190.0 lb

## 2018-08-19 DIAGNOSIS — I2510 Atherosclerotic heart disease of native coronary artery without angina pectoris: Secondary | ICD-10-CM

## 2018-08-19 DIAGNOSIS — I712 Thoracic aortic aneurysm, without rupture, unspecified: Secondary | ICD-10-CM

## 2018-08-19 DIAGNOSIS — I119 Hypertensive heart disease without heart failure: Secondary | ICD-10-CM

## 2018-08-19 DIAGNOSIS — E7800 Pure hypercholesterolemia, unspecified: Secondary | ICD-10-CM

## 2018-08-19 DIAGNOSIS — Z952 Presence of prosthetic heart valve: Secondary | ICD-10-CM

## 2018-08-19 DIAGNOSIS — I10 Essential (primary) hypertension: Secondary | ICD-10-CM

## 2018-08-19 MED ORDER — ASPIRIN EC 81 MG PO TBEC: 81.0000 mg | DELAYED_RELEASE_TABLET | Freq: Every day | ORAL | Status: DC

## 2018-08-19 NOTE — Patient Instructions (Signed)
Medication Instructions:  DECREASE ASPIRIN TO 81 MG ONCE DAILY If you need a refill on your cardiac medications before your next appointment, please call your pharmacy.   Lab work: If you have labs (blood work) drawn today and your tests are completely normal, you will receive your results only by: [name] MyChart Message (if you have MyChart) OR . A paper copy in the mail If you have any lab test that is abnormal or we need to change your treatment, we will call you to review the results.  Testing/Procedures: CTA OF THE AORTA TO FOLLOW UP ANEURYSM AT [place]  Follow-Up: At [facility], you and your health needs are our priority.  As part of our continuing mission to provide you with exceptional heart care, we have created designated Provider Care Teams.  These Care Teams include your primary Cardiologist (physician) and Advanced Practice Providers (APPs -  Physician Assistants and Nurse Practitioners) who all work together to provide you with the care you need, when you need it. You will need a follow up appointment in 12 months.  Please call our office 2 months in advance to schedule this appointment.  You may see [name] MD or one of the following Advanced Practice Providers on your designated Care Team:   Kerin Ransom, PA-C Roby Lofts, [place] . Sande Rives, PA-C

## 2018-08-23 ENCOUNTER — Other Ambulatory Visit (HOSPITAL_COMMUNITY)
Admission: RE | Admit: 2018-08-23 | Discharge: 2018-08-23 | Disposition: A | Discharge status: Home / Self Care | Payer: Medicare Other | Source: Ambulatory Visit | Attending: Urology | Admitting: Urology

## 2018-08-23 DIAGNOSIS — Z1159 Encounter for screening for other viral diseases: Secondary | ICD-10-CM | POA: Diagnosis present

## 2018-08-23 LAB — SARS CORONAVIRUS 2 (TAT 6-24 HRS): SARS Coronavirus 2: NEGATIVE

## 2018-08-24 NOTE — Progress Notes (Signed)
Called and lvm for selita, or scheduler for dr Tresa Moore, requested cardiac clearance .

## 2018-08-25 ENCOUNTER — Encounter (HOSPITAL_BASED_OUTPATIENT_CLINIC_OR_DEPARTMENT_OTHER): Payer: Self-pay | Admitting: *Deleted

## 2018-08-25 ENCOUNTER — Telehealth: Payer: Self-pay | Admitting: Cardiology

## 2018-08-25 NOTE — Telephone Encounter (Signed)
SBE prophylaxis is only needed prior to dental procedures. For this procedure patient would NOT need SBP.  The hospital should follow whatever is standard surgical prophylaxis for the procedure being preformed.

## 2018-08-25 NOTE — Telephone Encounter (Signed)
I called the patient, he is in [place]. His surgery is tomorrow.  Dr Jacalyn Lefevre last office note indicates he needs SBE prophylaxis before procedures, the patient is not aware of that.  Can Pharm comment on what SBE prophylaxis he will need ?  Kerin Ransom PA-C 08/25/2018 9:21 AM

## 2018-08-25 NOTE — Progress Notes (Signed)
Spoke w/ pt via phone for pre-op interview.  Npo after mn.  Arrive at 1015.  Needs istat.  Current ekg in chart and epic.  Pt had covd test done 08-23-2018.  Will take lopressor and synthroid am dos w/ sips of water.  Pt last cardiology office visit, telemedicine, dated 08-19-2018 with dr Stanford Breed w/ chart and epic.  Also telephone cardiac clearance dated 08-25-2018 in epic and chart.  Pt takes asa 81 mg, managed by cardiology.  Per pt last dose 08-14-2018.  Pt denies cardiac S&S or SOB.  Chart to be reviewed by anesthesia, Willeen Cass, NP.

## 2018-08-25 NOTE — Telephone Encounter (Signed)
   Primary Cardiologist: Dr Stanford Breed  Chart reviewed and patient contacted by phone today as part of pre-operative protocol coverage. Given past medical history and time since last visit, based on ACC/AHA guidelines, Danny Odom would be at acceptable risk for the planned procedure without further cardiovascular testing.   Ok to hold aspirin 5 days pre op.  No SBE required for this procedure.   I will route this recommendation to the requesting party via Epic fax function and remove from pre-op pool.  Please call with questions.  Kerin Ransom, PA-C 08/25/2018, 9:51 AM

## 2018-08-25 NOTE — Telephone Encounter (Signed)
° °  [facility] Pre-operative Risk Assessment    Request for surgical clearance:  1. What type of surgery is being performed?  Bilateral Orhiectomy   2. When is this surgery scheduled?  08-26-18- tomorrow - Need  This asap please- she said she just received a call from the hospital saying they need this 3.  4. What type of clearance is required (medical clearance vs. Pharmacy clearance to hold med vs. Both)?  Both  5. Are there any medications that need to be held prior to surgery and how long? Pt stopped his Aspirin 5 days ago, is that alright?  6. Practice name and name of physician performing surgery?  Dr Tresa Moore   7. What is your office phone number* [phone]    7.   What is your office fax number [phone]  8.   Anesthesia type (None, local, MAC, general) ? General*   Danny Odom 08/25/2018, 8:27 AM  _________________________________________________________________   (provider comments below)

## 2018-08-26 ENCOUNTER — Encounter (HOSPITAL_BASED_OUTPATIENT_CLINIC_OR_DEPARTMENT_OTHER)
Admission: RE | Disposition: A | Discharge status: Home / Self Care | Payer: Self-pay | Source: Home / Self Care | Attending: Urology

## 2018-08-26 ENCOUNTER — Ambulatory Visit (HOSPITAL_BASED_OUTPATIENT_CLINIC_OR_DEPARTMENT_OTHER): Payer: BC Managed Care – PPO | Admitting: Anesthesiology

## 2018-08-26 ENCOUNTER — Ambulatory Visit (HOSPITAL_BASED_OUTPATIENT_CLINIC_OR_DEPARTMENT_OTHER)
Admission: RE | Admit: 2018-08-26 | Discharge: 2018-08-26 | Disposition: A | Discharge status: Home / Self Care | Payer: BC Managed Care – PPO | Attending: Urology | Admitting: Urology

## 2018-08-26 ENCOUNTER — Encounter (HOSPITAL_BASED_OUTPATIENT_CLINIC_OR_DEPARTMENT_OTHER): Payer: Self-pay

## 2018-08-26 ENCOUNTER — Other Ambulatory Visit: Payer: Self-pay

## 2018-08-26 DIAGNOSIS — C61 Malignant neoplasm of prostate: Secondary | ICD-10-CM | POA: Diagnosis not present

## 2018-08-26 DIAGNOSIS — Z79899 Other long term (current) drug therapy: Secondary | ICD-10-CM | POA: Insufficient documentation

## 2018-08-26 DIAGNOSIS — N500 Atrophy of testis: Secondary | ICD-10-CM | POA: Diagnosis not present

## 2018-08-26 DIAGNOSIS — Z791 Long term (current) use of non-steroidal anti-inflammatories (NSAID): Secondary | ICD-10-CM | POA: Insufficient documentation

## 2018-08-26 DIAGNOSIS — E039 Hypothyroidism, unspecified: Secondary | ICD-10-CM | POA: Insufficient documentation

## 2018-08-26 DIAGNOSIS — N4611 Organic oligospermia: Secondary | ICD-10-CM | POA: Diagnosis not present

## 2018-08-26 DIAGNOSIS — Z952 Presence of prosthetic heart valve: Secondary | ICD-10-CM | POA: Diagnosis not present

## 2018-08-26 DIAGNOSIS — Z923 Personal history of irradiation: Secondary | ICD-10-CM | POA: Insufficient documentation

## 2018-08-26 DIAGNOSIS — Z9079 Acquired absence of other genital organ(s): Secondary | ICD-10-CM | POA: Diagnosis not present

## 2018-08-26 DIAGNOSIS — E785 Hyperlipidemia, unspecified: Secondary | ICD-10-CM | POA: Diagnosis not present

## 2018-08-26 DIAGNOSIS — Z79890 Hormone replacement therapy (postmenopausal): Secondary | ICD-10-CM | POA: Diagnosis not present

## 2018-08-26 DIAGNOSIS — Z951 Presence of aortocoronary bypass graft: Secondary | ICD-10-CM | POA: Insufficient documentation

## 2018-08-26 DIAGNOSIS — I2510 Atherosclerotic heart disease of native coronary artery without angina pectoris: Secondary | ICD-10-CM | POA: Diagnosis not present

## 2018-08-26 DIAGNOSIS — Z87891 Personal history of nicotine dependence: Secondary | ICD-10-CM | POA: Diagnosis not present

## 2018-08-26 DIAGNOSIS — I10 Essential (primary) hypertension: Secondary | ICD-10-CM | POA: Insufficient documentation

## 2018-08-26 DIAGNOSIS — Z9221 Personal history of antineoplastic chemotherapy: Secondary | ICD-10-CM | POA: Diagnosis not present

## 2018-08-26 DIAGNOSIS — Z7982 Long term (current) use of aspirin: Secondary | ICD-10-CM | POA: Insufficient documentation

## 2018-08-26 HISTORY — DX: Nocturia: R35.1

## 2018-08-26 HISTORY — DX: Presence of spectacles and contact lenses: Z97.3

## 2018-08-26 HISTORY — DX: Atherosclerotic heart disease of native coronary artery without angina pectoris: I25.10

## 2018-08-26 HISTORY — PX: ORCHIECTOMY: SHX2116

## 2018-08-26 HISTORY — DX: Malignant neoplasm of prostate: C61

## 2018-08-26 LAB — POCT I-STAT, CHEM 8
BUN: 20 mg/dL (ref 8–23)
Calcium, Ion: 1.25 mmol/L (ref 1.15–1.40)
Chloride: 107 mmol/L (ref 98–111)
Creatinine, Ser: 1 mg/dL (ref 0.61–1.24)
Glucose, Bld: 98 mg/dL (ref 70–99)
HCT: 45 % (ref 39.0–52.0)
Hemoglobin: 15.3 g/dL (ref 13.0–17.0)
Potassium: 4 mmol/L (ref 3.5–5.1)
Sodium: 142 mmol/L (ref 135–145)
TCO2: 24 mmol/L (ref 22–32)

## 2018-08-26 SURGERY — ORCHIECTOMY
Anesthesia: General | Site: Scrotum | Laterality: Bilateral

## 2018-08-26 MED ORDER — DICLOFENAC SODIUM 75 MG PO TBEC: 75.0000 mg | DELAYED_RELEASE_TABLET | Freq: Two times a day (BID) | ORAL | 2 refills | Status: DC | PRN

## 2018-08-26 MED ORDER — FENTANYL CITRATE (PF) 100 MCG/2ML IJ SOLN
25.0000 ug | INTRAMUSCULAR | Status: DC | PRN
  Filled 2018-08-26: qty 1

## 2018-08-26 MED ORDER — LIDOCAINE 2% (20 MG/ML) 5 ML SYRINGE
INTRAMUSCULAR | Status: AC
  Filled 2018-08-26: qty 5

## 2018-08-26 MED ORDER — PROPOFOL 10 MG/ML IV BOLUS
INTRAVENOUS | Status: DC | PRN
  Administered 2018-08-26: 200 mg via INTRAVENOUS

## 2018-08-26 MED ORDER — DEXAMETHASONE SODIUM PHOSPHATE 10 MG/ML IJ SOLN
INTRAMUSCULAR | Status: AC
  Filled 2018-08-26: qty 1

## 2018-08-26 MED ORDER — CLINDAMYCIN PHOSPHATE 900 MG/50ML IV SOLN
INTRAVENOUS | Status: AC
  Filled 2018-08-26: qty 50

## 2018-08-26 MED ORDER — OXYCODONE-ACETAMINOPHEN 5-325 MG PO TABS: 1.0000 | ORAL_TABLET | Freq: Four times a day (QID) | ORAL | 0 refills | Status: DC | PRN

## 2018-08-26 MED ORDER — ONDANSETRON HCL 4 MG/2ML IJ SOLN
INTRAMUSCULAR | Status: AC
  Filled 2018-08-26: qty 2

## 2018-08-26 MED ORDER — MIDAZOLAM HCL 2 MG/2ML IJ SOLN
INTRAMUSCULAR | Status: AC
  Filled 2018-08-26: qty 2

## 2018-08-26 MED ORDER — LACTATED RINGERS IV SOLN
INTRAVENOUS | Status: DC
  Administered 2018-08-26: 50 mL/h via INTRAVENOUS
  Filled 2018-08-26: qty 1000

## 2018-08-26 MED ORDER — PROPOFOL 10 MG/ML IV BOLUS
INTRAVENOUS | Status: AC
  Filled 2018-08-26: qty 40

## 2018-08-26 MED ORDER — DEXAMETHASONE SODIUM PHOSPHATE 10 MG/ML IJ SOLN
INTRAMUSCULAR | Status: DC | PRN
  Administered 2018-08-26: 5 mg via INTRAVENOUS

## 2018-08-26 MED ORDER — FENTANYL CITRATE (PF) 100 MCG/2ML IJ SOLN
INTRAMUSCULAR | Status: DC | PRN
  Administered 2018-08-26 (×2): 50 ug via INTRAVENOUS

## 2018-08-26 MED ORDER — SENNOSIDES-DOCUSATE SODIUM 8.6-50 MG PO TABS: 1.0000 | ORAL_TABLET | Freq: Two times a day (BID) | ORAL | 0 refills | Status: DC

## 2018-08-26 MED ORDER — ONDANSETRON HCL 4 MG/2ML IJ SOLN
INTRAMUSCULAR | Status: DC | PRN
  Administered 2018-08-26: 4 mg via INTRAVENOUS

## 2018-08-26 MED ORDER — LIDOCAINE 2% (20 MG/ML) 5 ML SYRINGE
INTRAMUSCULAR | Status: DC | PRN
  Administered 2018-08-26: 100 mg via INTRAVENOUS

## 2018-08-26 MED ORDER — STERILE WATER FOR IRRIGATION IR SOLN
Status: DC | PRN
  Administered 2018-08-26: 500 mL

## 2018-08-26 MED ORDER — CLINDAMYCIN PHOSPHATE 900 MG/50ML IV SOLN
900.0000 mg | INTRAVENOUS | Status: AC
  Administered 2018-08-26: 900 mg via INTRAVENOUS
  Filled 2018-08-26: qty 50

## 2018-08-26 MED ORDER — FENTANYL CITRATE (PF) 100 MCG/2ML IJ SOLN
INTRAMUSCULAR | Status: AC
  Filled 2018-08-26: qty 2

## 2018-08-26 MED FILL — OXYCODONE-ACETAMINOPHEN 5-3: 5-325 | 3 days supply | Qty: 15 | Fill #0

## 2018-08-26 MED FILL — DICLOFENAC SODIUM 75 MG TAB: 75 | 30 days supply | Qty: 60 | Fill #0

## 2018-08-26 SURGICAL SUPPLY — 44 items
BLADE CLIPPER SENSICLIP SURGIC (BLADE) ×1 IMPLANT
BLADE SURG 15 STRL LF DISP TIS (BLADE) ×1 IMPLANT
BLADE SURG 15 STRL SS (BLADE) ×2
BNDG GAUZE ELAST 4 BULKY (GAUZE/BANDAGES/DRESSINGS) ×1 IMPLANT
BRIEF STRETCH FOR OB PAD LRG (UNDERPADS AND DIAPERS) ×1 IMPLANT
COVER BACK TABLE 60X90IN (DRAPES) ×2 IMPLANT
COVER MAYO STAND STRL (DRAPES) ×2 IMPLANT
DISSECTOR ROUND CHERRY 3/8 STR (MISCELLANEOUS) IMPLANT
DRAIN PENROSE 18X1/2 LTX STRL (DRAIN) ×1 IMPLANT
DRAIN PENROSE 18X1/4 LTX STRL (DRAIN) ×1 IMPLANT
DRAPE LAPAROTOMY 100X72 PEDS (DRAPES) ×2 IMPLANT
ELECT NDL TIP 2.8 STRL (NEEDLE) ×1 IMPLANT
ELECT NEEDLE TIP 2.8 STRL (NEEDLE) ×2 IMPLANT
ELECT REM PT RETURN 9FT ADLT (ELECTROSURGICAL) ×2
ELECTRODE REM PT RTRN 9FT ADLT (ELECTROSURGICAL) ×1 IMPLANT
GAUZE SPONGE 4X4 12PLY STRL LF (GAUZE/BANDAGES/DRESSINGS) ×1 IMPLANT
GLOVE BIO SURGEON STRL SZ7 (GLOVE) ×1 IMPLANT
GLOVE BIO SURGEON STRL SZ7.5 (GLOVE) ×3 IMPLANT
GLOVE BIOGEL PI IND STRL 7.5 (GLOVE) IMPLANT
GLOVE BIOGEL PI IND STRL 8 (GLOVE) IMPLANT
GLOVE BIOGEL PI INDICATOR 7.5 (GLOVE) ×1
GLOVE BIOGEL PI INDICATOR 8 (GLOVE) ×1
GOWN STRL REUS W/ TWL LRG LVL3 (GOWN DISPOSABLE) ×1 IMPLANT
GOWN STRL REUS W/ TWL XL LVL3 (GOWN DISPOSABLE) ×1 IMPLANT
GOWN STRL REUS W/TWL LRG LVL3 (GOWN DISPOSABLE) ×4
GOWN STRL REUS W/TWL XL LVL3 (GOWN DISPOSABLE) ×2
KIT TURNOVER CYSTO (KITS) ×2 IMPLANT
NDL HYPO 25X1 1.5 SAFETY (NEEDLE) ×1 IMPLANT
NEEDLE HYPO 25X1 1.5 SAFETY (NEEDLE) ×2 IMPLANT
NS IRRIG 500ML POUR BTL (IV SOLUTION) IMPLANT
PACK BASIN DAY SURGERY FS (CUSTOM PROCEDURE TRAY) ×2 IMPLANT
PAD ABD 8X10 STRL (GAUZE/BANDAGES/DRESSINGS) ×2 IMPLANT
PENCIL BUTTON HOLSTER BLD 10FT (ELECTRODE) ×2 IMPLANT
SUT MNCRL AB 4-0 PS2 18 (SUTURE) ×3 IMPLANT
SUT SILK 0 TIES 10X30 (SUTURE) ×2 IMPLANT
SUT VIC AB 3-0 SH 27 (SUTURE) ×2
SUT VIC AB 3-0 SH 27X BRD (SUTURE) ×1 IMPLANT
SUT VICRYL 0 TIES 12 18 (SUTURE) ×1 IMPLANT
SYR CONTROL 10ML LL (SYRINGE) ×2 IMPLANT
TOWEL OR 17X26 10 PK STRL BLUE (TOWEL DISPOSABLE) ×3 IMPLANT
TRAY DSU PREP LF (CUSTOM PROCEDURE TRAY) ×2 IMPLANT
TUBE CONNECTING 12X1/4 (SUCTIONS) ×2 IMPLANT
WATER STERILE IRR 500ML POUR (IV SOLUTION) ×1 IMPLANT
YANKAUER SUCT BULB TIP NO VENT (SUCTIONS) ×2 IMPLANT

## 2018-08-26 NOTE — Brief Op Note (Signed)
08/26/2018  11:24 AM  PATIENT:  Danny Odom  68 y.o. male  PRE-OPERATIVE DIAGNOSIS:  METASTATIC PROSTATE CANCER  POST-OPERATIVE DIAGNOSIS:  METASTATIC PROSTATE CANCER  PROCEDURE:  Procedure(s): ORCHIECTOMY (Bilateral)  SURGEON:  Surgeon(s) and Role:    * Alexis Frock, MD - Primary  PHYSICIAN ASSISTANT:   ASSISTANTS: none   ANESTHESIA:   local and general  EBL:  5 mL   BLOOD ADMINISTERED:none  DRAINS: Penrose drain in the dependant scrotum   LOCAL MEDICATIONS USED:  MARCAINE     SPECIMEN:  Source of Specimen:  bilateral testes  DISPOSITION OF SPECIMEN:  PATHOLOGY  COUNTS:  YES  TOURNIQUET:  * No tourniquets in log *  DICTATION: .Other Dictation: Dictation Number [id number]  PLAN OF CARE: Discharge to home after PACU  PATIENT DISPOSITION:  PACU - hemodynamically stable.   Delay start of Pharmacological VTE agent (>24hrs) due to surgical blood loss or risk of bleeding: yes

## 2018-08-26 NOTE — H&P (Signed)
Danny Odom is an 68 y.o. male.    Chief Complaint: Pre-OP BILATERAL orchiectomy  HPI:   1 - Metastatic Prostate Cancer - s/p robotic prostatectomy 10/2013 with ICG sentinel + template pelvic lymphadenectomy for pT3aN1M0 Gleason 4+3=7 adenocarcinoma with negative margins. Single positive node adjacent to left ureter and ICG sentinel, other 22 lymph nodes benign. Pre-op PSA 7.81.    Recent Course:   12/2013 PSA 0.63; 01/2014 PSA 0.7 --> Adjuvant radiation to prostate fossa + pelvic node areas by Danny Odom   04/2014 PSA 0.3; 07/2014 PSA 0.1; 10/2014 PSA <0.1   04/2017 PSA 1.6 T <10 ==> Lupron 45; 10/2017 ==> Lupron 45   05/2018 PSA <0.01 / T 14 ==> Lupron 80    PMH sig for AVR/CABG (tissue valve 2019 by Danny Odom), rt inguinal varicocelectomy. His PCP is Danny Filbert MD with [facility] in [place].    Danny Odom is seen to proceed with bilateral simple orchiectomy for permanent androgen deprivation / cost savings over chronic Danny Odom therapy.    Past Medical History:  Diagnosis Date  . Aortic stenosis 2014   08-2017  severe;   s/p  avr   . Arthritis    back and joints  . Cardiac murmur   . Coronary artery disease cardiologist-- Danny Danny Odom   10-01-2017  s/p  cabg x2  . ED (erectile dysfunction)   . Hyperlipidemia   . Hypertension   . Hypothyroid    followed by pcp  . Nocturia   . Primary prostate cancer with metastasis from prostate to other site [facility]) urologist-- Danny Danny Odom   first dx 08-01-2013;  Stage T3a, gleason 3+4;  s/p  radical prostatectomy w/ node dissection 10-20-2013  and completed post surgery IMRT  04-19-2014  . S/P aortic valve replacement 10/01/2017   edwards pericardial valve  . S/P CABG x 2 10/01/2017   LIMA to Diagonal;   SVG to OM  . S/P radiation therapy 03/05/2014 through 04/19/2014   Prostate bed 6600 cGy in 33 sessions, pelvic lymph nodes 5000 445 cGy in 33 sessions   . Wears glasses     Past Surgical History:  Procedure  Laterality Date  . AORTIC VALVE REPLACEMENT N/A 10/01/2017   Procedure: AORTIC VALVE REPLACEMENT (AVR);  Surgeon: Danny Pollack, MD;  Location: [facility];  Service: Open Heart Surgery;  Laterality: N/A;  . COLONOSCOPY    . CORONARY ARTERY BYPASS GRAFT N/A 10/01/2017   Procedure: CORONARY ARTERY BYPASS GRAFTING (CABG) x two, using left internal mammary artery and right leg greater saphenous vein harvested endoscopically;  Surgeon: Danny Pollack, MD;  Location: [facility];  Service: Open Heart Surgery;  Laterality: N/A;  . LYMPHADENECTOMY Bilateral 10/20/2013   Procedure: BILATERAL PELVIC LYMPH NODE DISSECTION;  Surgeon: Danny Frock, MD;  Location: [facility];  Service: Urology;  Laterality: Bilateral;  . RIGHT/LEFT HEART CATH AND CORONARY ANGIOGRAPHY N/A 08/23/2017   Procedure: RIGHT/LEFT HEART CATH AND CORONARY ANGIOGRAPHY;  Surgeon: Danny Man, MD;  Location: [facility];  Service: Cardiovascular;  Laterality: N/A;  . ROBOT ASSISTED LAPAROSCOPIC RADICAL PROSTATECTOMY N/A 10/20/2013   Procedure: ROBOTIC ASSISTED LAPAROSCOPIC RADICAL PROSTATECTOMY WITH INDOCYANINE GREEN DYE;  Surgeon: Danny Frock, MD;  Location: [facility];  Service: Urology;  Laterality: N/A;  . TEE WITHOUT CARDIOVERSION N/A 10/01/2017   Procedure: TRANSESOPHAGEAL ECHOCARDIOGRAM (TEE);  Surgeon: Danny Pollack, MD;  Location: [facility];  Service: Open Heart Surgery;  Laterality: N/A;  . TONSILLECTOMY  child  . varicocele surgery  Right  1983 aaprx    Family History  Problem Relation Age of Onset  . Emphysema Father   . Prostate cancer Father   . Breast cancer Mother    Social History:  reports that he quit smoking about 35 years ago. His smoking use included cigarettes. He has a 20.00 pack-year smoking history. He has never used smokeless tobacco. He reports previous alcohol use. He reports that he does not use drugs.  Allergies: No Known Allergies  No medications prior to admission.    No results found for this or any  previous visit (from the past 48 hour(s)). No results found.  Review of Systems  Constitutional: Negative.   HENT: Negative.   Eyes: Negative.   Respiratory: Negative.   Cardiovascular: Negative.   Gastrointestinal: Negative.   Genitourinary: Negative.   Musculoskeletal: Negative.   Skin: Negative.   Neurological: Negative.   Endo/Heme/Allergies: Negative.   Psychiatric/Behavioral: Negative.     There were no vitals taken for this visit. Physical Exam  Constitutional: He appears well-developed.  HENT:  Head: Normocephalic.  Eyes: Pupils are equal, round, and reactive to light.  Neck: Normal range of motion.  Cardiovascular: Normal rate.  Respiratory: Effort normal.  GI:  Prior chest and abd scars w/o hernias.   Genitourinary:    Genitourinary Comments: NO CVAT.    Musculoskeletal: Normal range of motion.  Neurological: He is alert.  Skin: Skin is warm.  Psychiatric: He has a normal mood and affect.     Assessment/Plan  Proceed as planned with BILATERAL orchiectomy. Risks, benefits, alternatives, expected peri-op course discussed previously and reiterated Danny.   Danny Frock, MD 08/26/2018, 6:46 AM

## 2018-08-26 NOTE — Transfer of Care (Signed)
Immediate Anesthesia Transfer of Care Note  Patient: LEBERT LOVERN  Procedure(s) Performed: ORCHIECTOMY (Bilateral Scrotum)  Patient Location: PACU  Anesthesia Type:General  Level of Consciousness: awake, alert , oriented and patient cooperative  Airway & Oxygen Therapy: Patient Spontanous Breathing and Patient connected to nasal cannula oxygen  Post-op Assessment: Report given to RN, Post -op Vital signs reviewed and stable and Patient moving all extremities  Post vital signs: Reviewed and stable  Last Vitals:  Vitals Value Taken Time  BP    Temp    Pulse 48 08/26/18 1138  Resp 10 08/26/18 1138  SpO2 100 % 08/26/18 1138  Vitals shown include unvalidated device data.  Last Pain:  Vitals:   08/26/18 0902  TempSrc:   PainSc: 0-No pain      Patients Stated Pain Goal: 5 (19/62/22 9798)  Complications: No apparent anesthesia complications

## 2018-08-26 NOTE — Anesthesia Procedure Notes (Signed)
Procedure Name: LMA Insertion Date/Time: 08/26/2018 10:45 AM Performed by: Myna Bright, CRNA Pre-anesthesia Checklist: Patient identified, Emergency Drugs available, Suction available and Patient being monitored Patient Re-evaluated:Patient Re-evaluated prior to induction Oxygen Delivery Method: Circle system utilized Preoxygenation: Pre-oxygenation with 100% oxygen Induction Type: IV induction Ventilation: Mask ventilation without difficulty LMA: LMA inserted LMA Size: 4.0 Number of attempts: 1 Placement Confirmation: positive ETCO2 and breath sounds checked- equal and bilateral Tube secured with: Tape Dental Injury: Teeth and Oropharynx as per pre-operative assessment

## 2018-08-26 NOTE — Anesthesia Postprocedure Evaluation (Signed)
Anesthesia Post Note  Patient: Danny Odom  Procedure(s) Performed: ORCHIECTOMY (Bilateral Scrotum)     Patient location during evaluation: PACU Anesthesia Type: General Level of consciousness: awake Pain management: pain level controlled Vital Signs Assessment: post-procedure vital signs reviewed and stable Respiratory status: spontaneous breathing Cardiovascular status: stable Postop Assessment: no apparent nausea or vomiting Anesthetic complications: no    Last Vitals:  Vitals:   08/26/18 1242 08/26/18 1345  BP:  (!) 170/91  Pulse:  (!) 59  Resp:  20  Temp: 36.6 C 37.2 C  SpO2:  95%    Last Pain:  Vitals:   08/26/18 1345  TempSrc:   PainSc: 0-No pain                 [name]

## 2018-08-26 NOTE — Interval H&P Note (Signed)
Anesthesia H&P Update: History and Physical Exam reviewed; patient is OK for planned anesthetic and procedure. ? ?

## 2018-08-26 NOTE — Anesthesia Preprocedure Evaluation (Signed)
Anesthesia Evaluation  Patient identified by MRN, date of birth, ID band Patient awake    Reviewed: Allergy & Precautions, NPO status , Patient's Chart, lab work & pertinent test results  Airway Mallampati: II  TM Distance: >3 FB     Dental   Pulmonary former smoker,    breath sounds clear to auscultation       Cardiovascular hypertension, + CAD   Rhythm:Regular Rate:Normal     Neuro/Psych    GI/Hepatic negative GI ROS, Neg liver ROS,   Endo/Other  Hypothyroidism   Renal/GU negative Renal ROS     Musculoskeletal   Abdominal   Peds  Hematology   Anesthesia Other Findings   Reproductive/Obstetrics                             Anesthesia Physical Anesthesia Plan  ASA: III  Anesthesia Plan: General   Post-op Pain Management:    Induction: Intravenous  PONV Risk Score and Plan: 2 and Ondansetron, Dexamethasone and Midazolam  Airway Management Planned: LMA  Additional Equipment:   Intra-op Plan:   Post-operative Plan:   Informed Consent: I have reviewed the patients History and Physical, chart, labs and discussed the procedure including the risks, benefits and alternatives for the proposed anesthesia with the patient or authorized representative who has indicated his/her understanding and acceptance.     Dental advisory given  Plan Discussed with: CRNA and Anesthesiologist  Anesthesia Plan Comments:         Anesthesia Quick Evaluation

## 2018-08-26 NOTE — Discharge Instructions (Signed)
°  Post Anesthesia Home Care Instructions  Activity: Get plenty of rest for the remainder of the day. A responsible adult should stay with you for 24 hours following the procedure.  For the next 24 hours, DO NOT: -Drive a car -Paediatric nurse -Drink alcoholic beverages -Take any medication unless instructed by your physician -Make any legal decisions or sign important papers.  Meals: Start with liquid foods such as gelatin or soup. Progress to regular foods as tolerated. Avoid greasy, spicy, heavy foods. If nausea and/or vomiting occur, drink only clear liquids until the nausea and/or vomiting subsides. Call your physician if vomiting continues.  Special Instructions/Symptoms: Your throat may feel dry or sore from the anesthesia or the breathing tube placed in your throat during surgery. If this causes discomfort, gargle with warm salt water. The discomfort should disappear within 24 hours.  If you had a scopolamine patch placed behind your ear for the management of post- operative nausea and/or vomiting:  1. The medication in the patch is effective for 72 hours, after which it should be removed.  Wrap patch in a tissue and discard in the trash. Wash hands thoroughly with soap and water. 2. You may remove the patch earlier than 72 hours if you experience unpleasant side effects which may include dry mouth, dizziness or visual disturbances. 3. Avoid touching the patch. Wash your hands with soap and water after contact with the patch.   1 - You may have small pink-tinged drainage from area of scrotal drain x few days. This is normal. You can shower anytime. No tub bathing / swimming pools x 2 weeks.   2 - Call MD or go to ER for fever >102, severe pain / nausea / vomiting not relieved by medications, or acute change in medical status

## 2018-08-26 NOTE — Op Note (Signed)
NAME: Danny Odom, CASPERS MEDICAL RECORD YO:[id number] ACCOUNT [id number] DATE OF BIRTH:[birth year] FACILITY: [facility] LOCATION: [facility] PHYSICIAN:Taejon Irani Tresa Moore, MD  OPERATIVE REPORT  DATE OF PROCEDURE:  08/26/2018  PREOPERATIVE DIAGNOSIS:  Metastatic prostate cancer.  PROCEDURE:  Bilateral orchiectomy.  ESTIMATED BLOOD LOSS:  Nil.  MEDICATIONS:  None.  SPECIMEN:  Bilateral testes for pathology.  DRAINS:  Penrose drain to dependent scrotal drainage.  FINDINGS:  Unremarkable bilateral testes.  INDICATIONS:  The patient is a very pleasant but unfortunate 68 year old man with a history of aggressive prostate cancer.  He is status post prostatectomy and no dissection.  He is known node positive.  He had a local recurrence and now is on androgen  deprivation lifelong.  He has been compliant with [facility] therapy; however, this has been quite expensive for him with over $1000 out of pocket multiple times a year.  Options were discussed for long-term more cost effective post-androgen deprivation  including surgical orchiectomy, and he wished to proceed with this indication.  Informed consent was then placed in the medical record.  DESCRIPTION OF PROCEDURE:  The patient being identified, procedure being bilateral orchiectomy was confirmed.  Procedure time-out was performed.  Intravenous antibiotics were administered.  General anesthesia was induced.  The patient was placed in the  supine position.  Sterile fields were created by first clipper shaving, then prepping and draping the patient's entire penis, scrotum, and proximal thighs using iodine.  Next, a linear incision was made approximately 4 cm in length along the median  raphe, and dissection proceeded directly into the left scrotal compartment.  The left testis was somewhat atrophic.  It was delivered into the operative field.  The cord was palpated with the vas, placed in one half, vessels in the other.  Each cord half  was clamped, then  transected.  The right testis was set aside for pathology.  Each cord half was ligated using a 0 silk tie with another silk tie used to tie proximal to this, which resulted in excellent hemostasis.  The right cord was delivered into  the right hemiscrotum.  Through the same incision, the left testis was delivered.  Similarly, the cord was divided in 2 segments, ligated and transected in a similar fashion as per the right.  The left testis was set aside for permanent pathology.   Hemostasis was excellent.  A small counter incision was made in the dependent scrotum.  A 1/4-inch Penrose drain was placed such that it traversed both scrotal compartments.  The dartos was reapproximated using running 3-0 Vicryl.  The incisions were  infiltrated with dilute Marcaine and closed at the level of skin using running Monocryl.  Anesthesia was terminated.  The patient tolerated the procedure well.  No immediate complications.  The patient was taken to postanesthesia care unit in stable  condition.  LN/NUANCE  D:08/26/2018 T:08/26/2018 JOB:[id number]

## 2018-08-29 ENCOUNTER — Encounter (HOSPITAL_BASED_OUTPATIENT_CLINIC_OR_DEPARTMENT_OTHER): Payer: Self-pay | Admitting: Urology

## 2018-09-20 ENCOUNTER — Ambulatory Visit (INDEPENDENT_AMBULATORY_CARE_PROVIDER_SITE_OTHER)
Admission: RE | Admit: 2018-09-20 | Discharge: 2018-09-20 | Disposition: A | Discharge status: Home / Self Care | Payer: BC Managed Care – PPO | Source: Ambulatory Visit | Attending: Cardiology | Admitting: Cardiology

## 2018-09-20 ENCOUNTER — Inpatient Hospital Stay (HOSPITAL_COMMUNITY): Payer: BC Managed Care – PPO

## 2018-09-20 ENCOUNTER — Other Ambulatory Visit: Payer: Self-pay

## 2018-09-20 ENCOUNTER — Inpatient Hospital Stay (HOSPITAL_COMMUNITY)
Admission: EM | Admit: 2018-09-20 | Discharge: 2018-09-25 | DRG: 220 | Disposition: A | Discharge status: Home / Self Care | Payer: BC Managed Care – PPO | Attending: Cardiothoracic Surgery | Admitting: Cardiothoracic Surgery

## 2018-09-20 ENCOUNTER — Encounter (HOSPITAL_COMMUNITY): Payer: Self-pay

## 2018-09-20 DIAGNOSIS — I340 Nonrheumatic mitral (valve) insufficiency: Secondary | ICD-10-CM | POA: Diagnosis present

## 2018-09-20 DIAGNOSIS — Z825 Family history of asthma and other chronic lower respiratory diseases: Secondary | ICD-10-CM

## 2018-09-20 DIAGNOSIS — D62 Acute posthemorrhagic anemia: Secondary | ICD-10-CM | POA: Diagnosis not present

## 2018-09-20 DIAGNOSIS — Z09 Encounter for follow-up examination after completed treatment for conditions other than malignant neoplasm: Secondary | ICD-10-CM

## 2018-09-20 DIAGNOSIS — Z9079 Acquired absence of other genital organ(s): Secondary | ICD-10-CM | POA: Diagnosis not present

## 2018-09-20 DIAGNOSIS — I7101 Dissection of ascending aorta: Secondary | ICD-10-CM

## 2018-09-20 DIAGNOSIS — I10 Essential (primary) hypertension: Secondary | ICD-10-CM | POA: Diagnosis present

## 2018-09-20 DIAGNOSIS — I161 Hypertensive emergency: Secondary | ICD-10-CM | POA: Diagnosis present

## 2018-09-20 DIAGNOSIS — I739 Peripheral vascular disease, unspecified: Secondary | ICD-10-CM | POA: Diagnosis present

## 2018-09-20 DIAGNOSIS — I361 Nonrheumatic tricuspid (valve) insufficiency: Secondary | ICD-10-CM | POA: Diagnosis not present

## 2018-09-20 DIAGNOSIS — I71019 Dissection of thoracic aorta, unspecified: Secondary | ICD-10-CM | POA: Diagnosis present

## 2018-09-20 DIAGNOSIS — Z20828 Contact with and (suspected) exposure to other viral communicable diseases: Secondary | ICD-10-CM | POA: Diagnosis present

## 2018-09-20 DIAGNOSIS — Z953 Presence of xenogenic heart valve: Secondary | ICD-10-CM

## 2018-09-20 DIAGNOSIS — I2510 Atherosclerotic heart disease of native coronary artery without angina pectoris: Secondary | ICD-10-CM | POA: Diagnosis present

## 2018-09-20 DIAGNOSIS — I371 Nonrheumatic pulmonary valve insufficiency: Secondary | ICD-10-CM | POA: Diagnosis not present

## 2018-09-20 DIAGNOSIS — Z79899 Other long term (current) drug therapy: Secondary | ICD-10-CM

## 2018-09-20 DIAGNOSIS — Z951 Presence of aortocoronary bypass graft: Secondary | ICD-10-CM | POA: Diagnosis not present

## 2018-09-20 DIAGNOSIS — E8779 Other fluid overload: Secondary | ICD-10-CM | POA: Diagnosis not present

## 2018-09-20 DIAGNOSIS — Z8042 Family history of malignant neoplasm of prostate: Secondary | ICD-10-CM

## 2018-09-20 DIAGNOSIS — Z79890 Hormone replacement therapy (postmenopausal): Secondary | ICD-10-CM

## 2018-09-20 DIAGNOSIS — E039 Hypothyroidism, unspecified: Secondary | ICD-10-CM | POA: Diagnosis present

## 2018-09-20 DIAGNOSIS — I712 Thoracic aortic aneurysm, without rupture, unspecified: Secondary | ICD-10-CM

## 2018-09-20 DIAGNOSIS — Z9889 Other specified postprocedural states: Secondary | ICD-10-CM

## 2018-09-20 DIAGNOSIS — Z8546 Personal history of malignant neoplasm of prostate: Secondary | ICD-10-CM | POA: Diagnosis not present

## 2018-09-20 DIAGNOSIS — Z87891 Personal history of nicotine dependence: Secondary | ICD-10-CM | POA: Diagnosis not present

## 2018-09-20 LAB — POCT I-STAT 7, (LYTES, BLD GAS, ICA,H+H)
Acid-Base Excess: 1 mmol/L (ref 0.0–2.0)
Bicarbonate: 23.7 mmol/L (ref 20.0–28.0)
Calcium, Ion: 1.19 mmol/L (ref 1.15–1.40)
HCT: 42 % (ref 39.0–52.0)
Hemoglobin: 14.3 g/dL (ref 13.0–17.0)
O2 Saturation: 92 %
Patient temperature: 98
Potassium: 3.8 mmol/L (ref 3.5–5.1)
Sodium: 141 mmol/L (ref 135–145)
TCO2: 25 mmol/L (ref 22–32)
pCO2 arterial: 32.6 mmHg (ref 32.0–48.0)
pH, Arterial: 7.468 — ABNORMAL HIGH (ref 7.350–7.450)
pO2, Arterial: 59 mmHg — ABNORMAL LOW (ref 83.0–108.0)

## 2018-09-20 LAB — CBC WITH DIFFERENTIAL/PLATELET
Abs Immature Granulocytes: 0.02 10*3/uL (ref 0.00–0.07)
Basophils Absolute: 0 10*3/uL (ref 0.0–0.1)
Basophils Relative: 0 %
Eosinophils Absolute: 0.1 10*3/uL (ref 0.0–0.5)
Eosinophils Relative: 2 %
HCT: 43.8 % (ref 39.0–52.0)
Hemoglobin: 14 g/dL (ref 13.0–17.0)
Immature Granulocytes: 0 %
Lymphocytes Relative: 21 %
Lymphs Abs: 1.2 10*3/uL (ref 0.7–4.0)
MCH: 30.9 pg (ref 26.0–34.0)
MCHC: 32 g/dL (ref 30.0–36.0)
MCV: 96.7 fL (ref 80.0–100.0)
Monocytes Absolute: 0.7 10*3/uL (ref 0.1–1.0)
Monocytes Relative: 12 %
Neutro Abs: 3.7 10*3/uL (ref 1.7–7.7)
Neutrophils Relative %: 65 %
Platelets: 212 10*3/uL (ref 150–400)
RBC: 4.53 MIL/uL (ref 4.22–5.81)
RDW: 14.2 % (ref 11.5–15.5)
WBC: 5.7 10*3/uL (ref 4.0–10.5)
nRBC: 0 % (ref 0.0–0.2)

## 2018-09-20 LAB — URINALYSIS, ROUTINE W REFLEX MICROSCOPIC
Bacteria, UA: NONE SEEN
Bilirubin Urine: NEGATIVE
Glucose, UA: NEGATIVE mg/dL
Ketones, ur: NEGATIVE mg/dL
Leukocytes,Ua: NEGATIVE
Nitrite: NEGATIVE
Protein, ur: NEGATIVE mg/dL
Specific Gravity, Urine: 1.043 — ABNORMAL HIGH (ref 1.005–1.030)
pH: 6 (ref 5.0–8.0)

## 2018-09-20 LAB — PROTIME-INR
INR: 1 (ref 0.8–1.2)
Prothrombin Time: 13.3 seconds (ref 11.4–15.2)

## 2018-09-20 LAB — BASIC METABOLIC PANEL
Anion gap: 9 (ref 5–15)
BUN: 22 mg/dL (ref 8–23)
CO2: 25 mmol/L (ref 22–32)
Calcium: 9.3 mg/dL (ref 8.9–10.3)
Chloride: 107 mmol/L (ref 98–111)
Creatinine, Ser: 1.21 mg/dL (ref 0.61–1.24)
GFR calc Af Amer: >60 mL/min (ref >60)
GFR calc non Af Amer: >60 mL/min (ref >60)
Glucose, Bld: 104 mg/dL — ABNORMAL HIGH (ref 70–99)
Potassium: 4.2 mmol/L (ref 3.5–5.1)
Sodium: 141 mmol/L (ref 135–145)

## 2018-09-20 LAB — SURGICAL PCR SCREEN
MRSA, PCR: NEGATIVE
Staphylococcus aureus: NEGATIVE

## 2018-09-20 LAB — ECHOCARDIOGRAM COMPLETE
Height: 70 in
Weight: 3056 oz

## 2018-09-20 LAB — PREPARE RBC (CROSSMATCH)

## 2018-09-20 LAB — SARS CORONAVIRUS 2 BY RT PCR (HOSPITAL ORDER, PERFORMED IN ~~LOC~~ HOSPITAL LAB): SARS Coronavirus 2: NEGATIVE

## 2018-09-20 MED ORDER — TRANEXAMIC ACID (OHS) PUMP PRIME SOLUTION
2.0000 mg/kg | INTRAVENOUS | Status: DC
  Filled 2018-09-20: qty 1.73

## 2018-09-20 MED ORDER — MANNITOL 20 % IV SOLN
Freq: Once | INTRAVENOUS | Status: DC
  Filled 2018-09-20: qty 13

## 2018-09-20 MED ORDER — LORAZEPAM 0.5 MG PO TABS: 0.5000 mg | ORAL_TABLET | ORAL | Status: DC | PRN

## 2018-09-20 MED ORDER — SODIUM CHLORIDE 0.9 % IV SOLN
INTRAVENOUS | Status: DC
  Filled 2018-09-20: qty 30

## 2018-09-20 MED ORDER — NICARDIPINE HCL IN NACL 40-0.83 MG/200ML-% IV SOLN
3.0000 mg/h | INTRAVENOUS | Status: DC
  Administered 2018-09-20: 17:00:00 12.5 mg/h via INTRAVENOUS
  Administered 2018-09-20: 7.5 mg/h via INTRAVENOUS
  Filled 2018-09-20 (×4): qty 200

## 2018-09-20 MED ORDER — CHLORHEXIDINE GLUCONATE CLOTH 2 % EX PADS
6.0000 | MEDICATED_PAD | Freq: Once | CUTANEOUS | Status: AC
  Administered 2018-09-20: 6 via TOPICAL

## 2018-09-20 MED ORDER — BISACODYL 5 MG PO TBEC
5.0000 mg | DELAYED_RELEASE_TABLET | Freq: Once | ORAL | Status: AC
  Administered 2018-09-20: 5 mg via ORAL
  Filled 2018-09-20: qty 1

## 2018-09-20 MED ORDER — MILRINONE LACTATE IN DEXTROSE 20-5 MG/100ML-% IV SOLN
0.3000 ug/kg/min | INTRAVENOUS | Status: DC
  Filled 2018-09-20: qty 100

## 2018-09-20 MED ORDER — VANCOMYCIN HCL 10 G IV SOLR
1500.0000 mg | INTRAVENOUS | Status: DC
  Filled 2018-09-20: qty 1500

## 2018-09-20 MED ORDER — EPINEPHRINE HCL 5 MG/250ML IV SOLN IN NS
0.0000 ug/min | INTRAVENOUS | Status: DC
  Filled 2018-09-20: qty 250

## 2018-09-20 MED ORDER — CHLORHEXIDINE GLUCONATE 0.12 % MT SOLN
15.0000 mL | Freq: Once | OROMUCOSAL | Status: AC
  Administered 2018-09-21: 15 mL via OROMUCOSAL
  Filled 2018-09-20: qty 15

## 2018-09-20 MED ORDER — POTASSIUM CHLORIDE 2 MEQ/ML IV SOLN
80.0000 meq | INTRAVENOUS | Status: DC
  Filled 2018-09-20: qty 40

## 2018-09-20 MED ORDER — SODIUM CHLORIDE 0.9 % IV SOLN
1.5000 g | INTRAVENOUS | Status: DC
  Filled 2018-09-20: qty 1.5

## 2018-09-20 MED ORDER — METOPROLOL TARTRATE 12.5 MG HALF TABLET
12.5000 mg | ORAL_TABLET | Freq: Once | ORAL | Status: DC
  Filled 2018-09-20: qty 1

## 2018-09-20 MED ORDER — TRANEXAMIC ACID 1000 MG/10ML IV SOLN
1.5000 mg/kg/h | INTRAVENOUS | Status: DC
  Filled 2018-09-20 (×3): qty 25

## 2018-09-20 MED ORDER — TRANEXAMIC ACID (OHS) BOLUS VIA INFUSION
15.0000 mg/kg | INTRAVENOUS | Status: AC
  Administered 2018-09-21: 09:00:00 1299 mg via INTRAVENOUS
  Filled 2018-09-20: qty 1299

## 2018-09-20 MED ORDER — SODIUM CHLORIDE 0.9 % IV SOLN
750.0000 mg | INTRAVENOUS | Status: DC
  Filled 2018-09-20: qty 750

## 2018-09-20 MED ORDER — PHENYLEPHRINE HCL-NACL 20-0.9 MG/250ML-% IV SOLN
30.0000 ug/min | INTRAVENOUS | Status: DC
  Filled 2018-09-20: qty 250

## 2018-09-20 MED ORDER — CHLORHEXIDINE GLUCONATE CLOTH 2 % EX PADS
6.0000 | MEDICATED_PAD | Freq: Once | CUTANEOUS | Status: AC
  Administered 2018-09-20: 22:00:00 6 via TOPICAL

## 2018-09-20 MED ORDER — IOHEXOL 350 MG/ML SOLN
100.0000 mL | Freq: Once | INTRAVENOUS | Status: AC | PRN
  Administered 2018-09-20: 100 mL via INTRAVENOUS

## 2018-09-20 MED ORDER — DOPAMINE-DEXTROSE 3.2-5 MG/ML-% IV SOLN
0.0000 ug/kg/min | INTRAVENOUS | Status: DC
  Filled 2018-09-20: qty 250

## 2018-09-20 MED ORDER — INSULIN REGULAR(HUMAN) IN NACL 100-0.9 UT/100ML-% IV SOLN
INTRAVENOUS | Status: AC
  Administered 2018-09-21: 1 [IU]/h via INTRAVENOUS
  Filled 2018-09-20: qty 100

## 2018-09-20 MED ORDER — TEMAZEPAM 15 MG PO CAPS: 15.0000 mg | ORAL_CAPSULE | Freq: Once | ORAL | Status: DC | PRN

## 2018-09-20 MED ORDER — NICARDIPINE HCL IN NACL 20-0.86 MG/200ML-% IV SOLN
3.0000 mg/h | INTRAVENOUS | Status: DC
  Administered 2018-09-20: 13:00:00 5 mg/h via INTRAVENOUS
  Administered 2018-09-20: 10 mg/h via INTRAVENOUS
  Filled 2018-09-20 (×3): qty 200

## 2018-09-20 MED ORDER — NITROGLYCERIN IN D5W 200-5 MCG/ML-% IV SOLN
2.0000 ug/min | INTRAVENOUS | Status: AC
  Administered 2018-09-21: 09:00:00 16.6 ug/min via INTRAVENOUS
  Filled 2018-09-20: qty 250

## 2018-09-20 MED ORDER — PLASMA-LYTE 148 IV SOLN
INTRAVENOUS | Status: AC
  Administered 2018-09-21: 500 mL
  Filled 2018-09-20: qty 2.5

## 2018-09-20 MED ORDER — DEXMEDETOMIDINE HCL IN NACL 400 MCG/100ML IV SOLN
0.1000 ug/kg/h | INTRAVENOUS | Status: DC
  Filled 2018-09-20: qty 100

## 2018-09-20 NOTE — Anesthesia Preprocedure Evaluation (Addendum)
Anesthesia Evaluation  Patient identified by MRN, date of birth, ID band Patient awake    Reviewed: Allergy & Precautions, H&P , NPO status , Patient's Chart, lab work & pertinent test results, reviewed documented beta blocker date and time   Airway Mallampati: II  TM Distance: >3 FB Neck ROM: Full    Dental no notable dental hx. (+) Teeth Intact, Dental Advisory Given   Pulmonary neg pulmonary ROS, former smoker,    Pulmonary exam normal breath sounds clear to auscultation       Cardiovascular Exercise Tolerance: Good hypertension, Pt. on medications and Pt. on home beta blockers + CAD and + Peripheral Vascular Disease   Rhythm:Regular Rate:Normal     Neuro/Psych negative neurological ROS  negative psych ROS   GI/Hepatic negative GI ROS, Neg liver ROS,   Endo/Other  Hypothyroidism   Renal/GU negative Renal ROS  negative genitourinary   Musculoskeletal  (+) Arthritis , Osteoarthritis,    Abdominal   Peds  Hematology negative hematology ROS (+)   Anesthesia Other Findings   Reproductive/Obstetrics negative OB ROS                            Anesthesia Physical Anesthesia Plan  ASA: IV  Anesthesia Plan: General   Post-op Pain Management:    Induction: Intravenous  PONV Risk Score and Plan: 2 and Midazolam and Ondansetron  Airway Management Planned: Oral ETT  Additional Equipment: Arterial line, CVP, PA Cath, TEE and Ultrasound Guidance Line Placement  Intra-op Plan:   Post-operative Plan: Post-operative intubation/ventilation  Informed Consent: I have reviewed the patients History and Physical, chart, labs and discussed the procedure including the risks, benefits and alternatives for the proposed anesthesia with the patient or authorized representative who has indicated his/her understanding and acceptance.     Dental advisory given  Plan Discussed with: CRNA  Anesthesia  Plan Comments:         Anesthesia Quick Evaluation

## 2018-09-20 NOTE — ED Notes (Signed)
Cardiology at bedside.

## 2018-09-20 NOTE — ED Notes (Signed)
ED TO INPATIENT HANDOFF REPORT  ED Nurse Name and Phone #: Davene Costain [phone]  S Name/Age/Gender Danny Odom 68 y.o. male Room/Bed: 032C/032C  Code Status   Code Status: Full Code  Home/SNF/Other Home Patient oriented to: self, place, time and situation Is this baseline? Yes   Triage Complete: Triage complete  Chief Complaint abnormal CT, sent by MD  Triage Note Pt sent by cardiologist for further evaluation of aortic dissection (annual screening since discovery of same last year post valve replacement) found to be increased in size by 1 cm since last july; denies CP, sob   Allergies No Known Allergies  Level of Care/Admitting Diagnosis ED Disposition    ED Disposition Condition [place]: [place] [100100]  Level of Care: ICU [6]  Covid Evaluation: Person Under Investigation (PUI)  Diagnosis: Aortic dissection [facility]) [[id number]]  Admitting Physician: Cyndie Chime  Attending Physician: [name] [[id number]]  Estimated length of stay: 3 - 4 days  Certification:: I certify this patient will need inpatient services for at least 2 midnights  PT Class (Do Not Modify): Inpatient [101]  PT Acc Code (Do Not Modify): Private [1]       B Medical/Surgery History Past Medical History:  Diagnosis Date  . Aortic stenosis 2014   08-2017  severe;   s/p  avr   . Arthritis    back and joints  . Cardiac murmur   . Coronary artery disease cardiologist-- dr Stanford Breed   10-01-2017  s/p  cabg x2  . ED (erectile dysfunction)   . Hyperlipidemia   . Hypertension   . Hypothyroid    followed by pcp  . Nocturia   . Primary prostate cancer with metastasis from prostate to other site [facility]) urologist-- dr Tresa Moore   first dx 08-01-2013;  Stage T3a, gleason 3+4;  s/p  radical prostatectomy w/ node dissection 10-20-2013  and completed post surgery IMRT  04-19-2014  . S/P aortic valve replacement 10/01/2017   edwards pericardial valve   . S/P CABG x 2 10/01/2017   LIMA to Diagonal;   SVG to OM  . S/P radiation therapy 03/05/2014 through 04/19/2014   Prostate bed 6600 cGy in 33 sessions, pelvic lymph nodes 5000 445 cGy in 33 sessions   . Wears glasses    Past Surgical History:  Procedure Laterality Date  . AORTIC VALVE REPLACEMENT N/A 10/01/2017   Procedure: AORTIC VALVE REPLACEMENT (AVR);  Surgeon: Gaye Pollack, MD;  Location: [place];  Service: Open Heart Surgery;  Laterality: N/A;  . COLONOSCOPY    . CORONARY ARTERY BYPASS GRAFT N/A 10/01/2017   Procedure: CORONARY ARTERY BYPASS GRAFTING (CABG) x two, using left internal mammary artery and right leg greater saphenous vein harvested endoscopically;  Surgeon: Gaye Pollack, MD;  Location: [place] OR;  Service: Open Heart Surgery;  Laterality: N/A;  . LYMPHADENECTOMY Bilateral 10/20/2013   Procedure: BILATERAL PELVIC LYMPH NODE DISSECTION;  Surgeon: Alexis Frock, MD;  [facility];  Service: Urology;  Laterality: Bilateral;  . ORCHIECTOMY Bilateral 08/26/2018   Procedure: ORCHIECTOMY;  Surgeon: Alexis Frock, MD;  [facility];  Service: Urology;  Laterality: Bilateral;  . RIGHT/LEFT HEART CATH AND CORONARY ANGIOGRAPHY N/A 08/23/2017   Procedure: RIGHT/LEFT HEART CATH AND CORONARY ANGIOGRAPHY;  Surgeon: Leonie Man, MD;  Location: [place] CV LAB;  Service: Cardiovascular;  Laterality: N/A;  . ROBOT ASSISTED LAPAROSCOPIC RADICAL PROSTATECTOMY N/A 10/20/2013   Procedure: ROBOTIC ASSISTED  LAPAROSCOPIC RADICAL PROSTATECTOMY WITH INDOCYANINE GREEN DYE;  Surgeon: Alexis Frock, MD;  [facility];  Service: Urology;  Laterality: N/A;  . TEE WITHOUT CARDIOVERSION N/A 10/01/2017   Procedure: TRANSESOPHAGEAL ECHOCARDIOGRAM (TEE);  Surgeon: Gaye Pollack, MD;  Location: [place];  Service: Open Heart Surgery;  Laterality: N/A;  . TONSILLECTOMY  child  . varicocele surgery  Right 1983 aaprx     A IV  Location/Drains/Wounds Patient Lines/Drains/Airways Status   Active Line/Drains/Airways    Name:   Placement date:   Placement time:   Site:   Days:   Peripheral IV 09/20/18 Right Hand   09/20/18    1213    Hand   less than 1   Peripheral IV 09/20/18 Right Antecubital   09/20/18    1203    Antecubital   less than 1   Open Drain 1 Midline Other (Comment) 12 Fr.   08/26/18    1103    Other (Comment)   25   Incision (Closed) 10/01/17 Chest Other (Comment)   10/01/17    0852     354   Incision (Closed) 10/01/17 Leg Right   10/01/17    0929     354   Incision (Closed) 08/26/18 Scrotum Other (Comment)   08/26/18    1139     25          Intake/Output Last 24 hours No intake or output data in the 24 hours ending 09/20/18 1252  Labs/Imaging Results for orders placed or performed during the hospital encounter of 09/20/18 (from the past 48 hour(s))  CBC with Differential     Status: None   Collection Time: 09/20/18 12:00 PM  Result Value Ref Range   WBC 5.7 4.0 - 10.5 K/uL   RBC 4.53 4.22 - 5.81 MIL/uL   Hemoglobin 14.0 13.0 - 17.0 g/dL   HCT 43.8 39.0 - 52.0 %   MCV 96.7 80.0 - 100.0 fL   MCH 30.9 26.0 - 34.0 pg   MCHC 32.0 30.0 - 36.0 g/dL   RDW 14.2 11.5 - 15.5 %   Platelets 212 150 - 400 K/uL   nRBC 0.0 0.0 - 0.2 %   Neutrophils Relative % 65 %   Neutro Abs 3.7 1.7 - 7.7 K/uL   Lymphocytes Relative 21 %   Lymphs Abs 1.2 0.7 - 4.0 K/uL   Monocytes Relative 12 %   Monocytes Absolute 0.7 0.1 - 1.0 K/uL   Eosinophils Relative 2 %   Eosinophils Absolute 0.1 0.0 - 0.5 K/uL   Basophils Relative 0 %   Basophils Absolute 0.0 0.0 - 0.1 K/uL   Immature Granulocytes 0 %   Abs Immature Granulocytes 0.02 0.00 - 0.07 K/uL    Comment: Performed at [facility], [address] [place], [place] [address]  Basic metabolic panel     Status: Abnormal   Collection Time: 09/20/18 12:00 PM  Result Value Ref Range   Sodium 141 135 - 145 mmol/L   Potassium 4.2 3.5 - 5.1 mmol/L   Chloride 107  98 - 111 mmol/L   CO2 25 22 - 32 mmol/L   Glucose, Bld 104 (H) 70 - 99 mg/dL   BUN 22 8 - 23 mg/dL   Creatinine, Ser 1.21 0.61 - 1.24 mg/dL   Calcium 9.3 8.9 - 10.3 mg/dL   GFR calc non Af Amer >60 >60 mL/min   GFR calc Af Amer >60 >60 mL/min   Anion gap 9 5 -  15    Comment: Performed at [facility], [place] [address], [place], [place] [address]  Protime-INR     Status: None   Collection Time: 09/20/18 12:00 PM  Result Value Ref Range   Prothrombin Time 13.3 11.4 - 15.2 seconds   INR 1.0 0.8 - 1.2    Comment: (NOTE) INR goal varies based on device and disease states. Performed at [facility], [place] [address], [place], [place] [address]    Ct Angio Chest Aorta W &/or Wo Contrast  Result Date: 09/20/2018 CLINICAL DATA:  Follow-up dilated aortic root. EXAM: CT ANGIOGRAPHY CHEST WITH CONTRAST TECHNIQUE: Multidetector CT imaging of the chest was performed using the standard protocol during bolus administration of intravenous contrast. Multiplanar CT image reconstructions and MIPs were obtained to evaluate the vascular anatomy. CONTRAST:  146mL OMNIPAQUE IOHEXOL 350 MG/ML SOLN COMPARISON:  08/30/2017 FINDINGS: Cardiovascular: Interval aortic valve replacement and CABG. There is significant progression of ascending aortic dilatation, now 5 cm in diameter with partially enhancing false lumen along the outer wall. The solitary venous graft and the native coronary ostia are spared from the dissection. No pericardial effusion. Normal diameter arch and descending segment. Mediastinum/Nodes: Negative for adenopathy or hematoma. No inflammatory changes Lungs/Pleura: Mild linear scarring and microcalcification at the lung bases. Upper Abdomen: Negative Musculoskeletal: No acute or aggressive finding These results were called by telephone at the time of interpretation on 09/20/2018 at 11:21 am to Dr. Sallyanne Kuster, who verbally acknowledged these results. The patient is being referred to the [place].  Review of the MIP images confirms the above findings. IMPRESSION: 1. Interval (July 2019) ascending aortic dissection with partially enhancing false lumen. An associated ascending aortic aneurysm has increased from 4 cm to 5 cm diameter. 2. Interval CABG. The solitary saphenous graft is uninvolved by the dissection. 3. Interval aortic valve replacement. Electronically Signed   By: Monte Fantasia M.D.   On: 09/20/2018 11:32    Pending Labs Unresulted Labs (From admission, onward)    Start     Ordered   09/20/18 1244  HIV antibody (Routine Testing)  Once,   STAT     09/20/18 1246   09/20/18 1201  SARS Coronavirus 2 [facility] order, Performed in [facility] hospital lab) Nasopharyngeal Nasopharyngeal Swab  (Symptomatic/High Risk of Exposure/Tier 1 Patients Labs with Precautions)  Once,   STAT    Question Answer Comment  Is this test for diagnosis or screening Screening   Symptomatic for COVID-19 as defined by CDC No   Hospitalized for COVID-19 No   Admitted to ICU for COVID-19 No   Previously tested for COVID-19 No   Resident in a congregate (group) care setting No   Employed in healthcare setting No      09/20/18 1200          Vitals/Pain Today's Vitals   09/20/18 1200 09/20/18 1204 09/20/18 1215  BP:  (!) 172/101 (!) 194/89  Pulse:  (!) 58 (!) 57  Resp:  17 16  Temp:  98.7 F (37.1 C)   TempSrc:  Oral   SpO2:  96% 96%  Weight: 86.6 kg    Height: 5\' 10"  (1.778 m)    PainSc: 0-No pain      Isolation Precautions No active isolations  Medications Medications  nicardipine (CARDENE) 20mg  in 0.86% saline 232ml IV infusion (0.1 mg/ml) (has no administration in time range)    Mobility walks Low fall risk   Focused Assessments Cardiac Assessment Handoff:  Cardiac  Rhythm: Sinus bradycardia No results found for: CKTOTAL, CKMB, CKMBINDEX, TROPONINI No results found for: DDIMER Does the Patient currently have chest pain? No      R Recommendations: See Admitting  Provider Note  Report given to:   Additional Notes:

## 2018-09-20 NOTE — Consult Note (Signed)
[address]       [place] [address]             [phone]        Seward M Guest [place] Medical Record #[id number] Date of Birth: 12/29/1950  Referring: No ref. provider found Primary Care: Rusty Aus, MD Primary Cardiologist:Brian Stanford Breed, MD  Chief Complaint:    Chief Complaint  Patient presents with  . Thoracic Aortic Dissection    History of Present Illness:     68 yo man with history of bicuspid aortic valve disease and CAD s/p AVR/CABG in 8/19 [facility]) presented today for routine surveillance CT of chest. Findings suggestive of aortic dissection (contained rupture) at level of the aortic root. Sent to [facility] ED. Here, his is asymptomatic but hypertensive. Has been working lots around house lately to prepare for home sell, and denies any sx of chest pain, back pain, heart burn, shortness of breath or DOE.    Current Activity/ Functional Status: Patient is independent with mobility/ambulation, transfers, ADL's, IADL's.   Zubrod Score: At the time of surgery this patient's most appropriate activity status/level should be described as: [x]     0    Normal activity, no symptoms []     1    Restricted in physical strenuous activity but ambulatory, able to do out light work []     2    Ambulatory and capable of self care, unable to do work activities, up and about                 more than 50%  Of the time                            []     3    Only limited self care, in bed greater than 50% of waking hours []     4    Completely disabled, no self care, confined to bed or chair []     5    Moribund  Past Medical History:  Diagnosis Date  . Aortic stenosis 2014   08-2017  severe;   s/p  avr   . Arthritis    back and joints  . Cardiac murmur   . Coronary artery disease cardiologist-- dr Stanford Breed   10-01-2017  s/p  cabg x2  . ED (erectile dysfunction)   . Hyperlipidemia   . Hypertension   . Hypothyroid    followed by pcp  . Nocturia   .  Primary prostate cancer with metastasis from prostate to other site [facility]) urologist-- dr Tresa Moore   first dx 08-01-2013;  Stage T3a, gleason 3+4;  s/p  radical prostatectomy w/ node dissection 10-20-2013  and completed post surgery IMRT  04-19-2014  . S/P aortic valve replacement 10/01/2017   edwards pericardial valve  . S/P CABG x 2 10/01/2017   LIMA to Diagonal;   SVG to OM  . S/P radiation therapy 03/05/2014 through 04/19/2014   Prostate bed 6600 cGy in 33 sessions, pelvic lymph nodes 5000 445 cGy in 33 sessions   . Wears glasses     Past Surgical History:  Procedure Laterality Date  . AORTIC VALVE REPLACEMENT N/A 10/01/2017   Procedure: AORTIC VALVE REPLACEMENT (AVR);  Surgeon: Gaye Pollack, MD;  Location: [place];  Service: Open Heart Surgery;  Laterality: N/A;  . COLONOSCOPY    . CORONARY ARTERY BYPASS GRAFT N/A 10/01/2017   Procedure: CORONARY ARTERY  BYPASS GRAFTING (CABG) x two, using left internal mammary artery and right leg greater saphenous vein harvested endoscopically;  Surgeon: Gaye Pollack, MD;  Location: [place] [facility];  Service: Open Heart Surgery;  Laterality: N/A;  . LYMPHADENECTOMY Bilateral 10/20/2013   Procedure: BILATERAL PELVIC LYMPH NODE DISSECTION;  Surgeon: Alexis Frock, MD;  [facility];  Service: Urology;  Laterality: Bilateral;  . ORCHIECTOMY Bilateral 08/26/2018   Procedure: ORCHIECTOMY;  Surgeon: Alexis Frock, MD;  Location: [facility];  Service: Urology;  Laterality: Bilateral;  . RIGHT/LEFT HEART CATH AND CORONARY ANGIOGRAPHY N/A 08/23/2017   Procedure: RIGHT/LEFT HEART CATH AND CORONARY ANGIOGRAPHY;  Surgeon: Leonie Man, MD;  Location: [place] CV LAB;  Service: Cardiovascular;  Laterality: N/A;  . ROBOT ASSISTED LAPAROSCOPIC RADICAL PROSTATECTOMY N/A 10/20/2013   Procedure: ROBOTIC ASSISTED LAPAROSCOPIC RADICAL PROSTATECTOMY WITH INDOCYANINE GREEN DYE;  Surgeon: Alexis Frock, MD;  [facility];  Service:  Urology;  Laterality: N/A;  . TEE WITHOUT CARDIOVERSION N/A 10/01/2017   Procedure: TRANSESOPHAGEAL ECHOCARDIOGRAM (TEE);  Surgeon: Gaye Pollack, MD;  Location: [place];  Service: Open Heart Surgery;  Laterality: N/A;  . TONSILLECTOMY  child  . varicocele surgery  Right 1983 aaprx    Social History   Tobacco Use  Smoking Status Former Smoker  . Packs/day: 1.00  . Years: 20.00  . Pack years: 20.00  . Types: Cigarettes  . Quit date: 02/15/1983  . Years since quitting: 35.6  Smokeless Tobacco Never Used    Social History   Substance and Sexual Activity  Alcohol Use Not Currently  . Alcohol/week: 0.0 standard drinks   Comment: rarely      No Known Allergies  Current Facility-Administered Medications  Medication Dose Route Frequency Provider Last Rate Last Dose  . nicardipine (CARDENE) 20mg  in 0.86% saline 231ml IV infusion (0.1 mg/ml)  3-15 mg/hr Intravenous Continuous Curatolo, Adam, DO       Current Outpatient Medications  Medication Sig Dispense Refill  . atorvastatin (LIPITOR) 40 MG tablet Take 1 tablet (40 mg total) by mouth daily at 6 PM. (Patient taking differently: Take 40 mg by mouth at bedtime. ) 90 tablet 3  . diclofenac (VOLTAREN) 75 MG EC tablet Take 1 tablet (75 mg total) by mouth 2 (two) times daily as needed. 60 tablet 2  . levothyroxine (SYNTHROID, LEVOTHROID) 75 MCG tablet Take 75 mcg by mouth daily before breakfast.     . losartan (COZAAR) 50 MG tablet Take 1 tablet (50 mg total) by mouth daily. (Patient taking differently: Take 50 mg by mouth at bedtime. ) 90 tablet 3  . metoprolol tartrate (LOPRESSOR) 25 MG tablet Take 0.5 tablets (12.5 mg total) by mouth 2 (two) times daily. 90 tablet 3  . Multiple Vitamin (MULTIVITAMIN WITH MINERALS) TABS tablet Take 1 tablet by mouth daily with breakfast. ONE-A-DAY MEN'S 50+    . oxyCODONE-acetaminophen (PERCOCET) 5-325 MG tablet Take 1-2 tablets by mouth every 6 (six) hours as needed for moderate pain or severe pain.  Post-operatively 15 tablet 0  . senna-docusate (SENOKOT-S) 8.6-50 MG tablet Take 1 tablet by mouth 2 (two) times daily. While taking strong pain meds to prevent constipation 20 tablet 0  . zinc gluconate 50 MG tablet Take 50 mg by mouth daily with breakfast.       (Not in a hospital admission)   Family History  Problem Relation Age of Onset  . Emphysema Father   . Prostate cancer Father   . Breast cancer Mother  Review of Systems:   ROS Pertinent items noted in HPI and remainder of comprehensive ROS otherwise negative.     Cardiac Review of Systems: Y or  [    ]= no  Chest Pain [n    ]  Resting SOB [ n  ] Exertional SOB  [  ]  Orthopnea [  ]   Pedal Edema [   ]    Palpitations [  ] Syncope  [  ]   Presyncope [   ]  General Review of Systems: [Y] = yes [  ]=no Constitional: recent weight change [n  ]; anorexia [  ]; fatigue [n  ]; nausea [  ]; night sweats [  ]; fever [  ]; or chills [  ]                                                               Dental: Last Dentist visit:   Eye : blurred vision [  ]; diplopia [   ]; vision changes [  ];  Amaurosis fugax[  ]; Resp: cough [  ];  wheezing[  ];  hemoptysis[  ]; shortness of breath[  ]; paroxysmal nocturnal dyspnea[  ]; dyspnea on exertion[  ]; or orthopnea[  ];  GI:  gallstones[  ], vomiting[n  ];  dysphagia[  ]; melena[  ];  hematochezia [  ]; heartburn[  ];   Hx of  Colonoscopy[  ]; GU: kidney stones [  ]; hematuria[  ];   dysuria [  ];  nocturia[  ];  history of     obstruction [  ]; urinary frequency [  ]             Skin: rash, swelling[  ];, hair loss[  ];  peripheral edema[  ];  or itching[  ]; Musculosketetal: myalgias[ n ];  joint swelling[ n ];  joint erythema[  ];  joint pain[  ];  back pain[  ];  Heme/Lymph: bruising[n  ];  bleeding[  ];  anemia[  ];  Neuro: TIA[ n ];  headaches[ n ];  stroke[  ];  vertigo[  ];  seizures[  ];   paresthesias[  ];  difficulty walking[  ];  Psych:depression[  ]; anxiety[  ];   Endocrine: diabetes[  ];  thyroid dysfunction[  ];               Physical Exam: BP (!) 194/89   Pulse (!) 57   Temp 98.7 F (37.1 C) (Oral)   Resp 16   Ht 5\' 10"  (1.778 m)   Wt 86.6 kg   SpO2 96%   BMI 27.41 kg/m    General appearance: alert, appears stated age and no distress Head: Normocephalic, without obvious abnormality, atraumatic Neck: no adenopathy, no carotid bruit, no JVD, supple, symmetrical, trachea midline and thyroid not enlarged, symmetric, no tenderness/mass/nodules Lymph nodes: Cervical, supraclavicular, and axillary nodes normal. Resp: clear to auscultation bilaterally Cardio: regular rate and rhythm, S1, S2 normal, no murmur, click, rub or gallop GI: soft, non-tender; bowel sounds normal; no masses,  no organomegaly Extremities: extremities normal, atraumatic, no cyanosis or edema Neurologic: Alert and oriented X 3, normal strength and tone. Normal symmetric reflexes. Normal coordination and gait  Diagnostic Studies & Laboratory  data:  Recent Radiology Findings:   Ct Angio Chest Aorta W &/or Wo Contrast  Result Date: 09/20/2018 CLINICAL DATA:  Follow-up dilated aortic root. EXAM: CT ANGIOGRAPHY CHEST WITH CONTRAST TECHNIQUE: Multidetector CT imaging of the chest was performed using the standard protocol during bolus administration of intravenous contrast. Multiplanar CT image reconstructions and MIPs were obtained to evaluate the vascular anatomy. CONTRAST:  165mL OMNIPAQUE IOHEXOL 350 MG/ML SOLN COMPARISON:  08/30/2017 FINDINGS: Cardiovascular: Interval aortic valve replacement and CABG. There is significant progression of ascending aortic dilatation, now 5 cm in diameter with partially enhancing false lumen along the outer wall. The solitary venous graft and the native coronary ostia are spared from the dissection. No pericardial effusion. Normal diameter arch and descending segment. Mediastinum/Nodes: Negative for adenopathy or hematoma. No inflammatory changes  Lungs/Pleura: Mild linear scarring and microcalcification at the lung bases. Upper Abdomen: Negative Musculoskeletal: No acute or aggressive finding These results were called by telephone at the time of interpretation on 09/20/2018 at 11:21 am to Dr. Sallyanne Kuster, who verbally acknowledged these results. The patient is being referred to the [facility]. Review of the MIP images confirms the above findings. IMPRESSION: 1. Interval (July 2019) ascending aortic dissection with partially enhancing false lumen. An associated ascending aortic aneurysm has increased from 4 cm to 5 cm diameter. 2. Interval CABG. The solitary saphenous graft is uninvolved by the dissection. 3. Interval aortic valve replacement. Electronically Signed   By: Monte Fantasia M.D.   On: 09/20/2018 11:32     I have independently reviewed the above radiologic studies and discussed with the patient   Recent Lab Findings: Lab Results  Component Value Date   WBC 5.7 09/20/2018   HGB 14.0 09/20/2018   HCT 43.8 09/20/2018   PLT 212 09/20/2018   GLUCOSE 104 (H) 09/20/2018   CHOL 141 02/01/2018   TRIG 115 02/01/2018   HDL 53 02/01/2018   LDLCALC 65 02/01/2018   ALT 12 02/01/2018   AST 11 02/01/2018   NA 141 09/20/2018   K 4.2 09/20/2018   CL 107 09/20/2018   CREATININE 1.21 09/20/2018   BUN 22 09/20/2018   CO2 25 09/20/2018   INR 1.0 09/20/2018   HGBA1C 5.5 09/29/2017   BMP Latest Ref Rng & Units 09/20/2018 08/26/2018 02/01/2018  Glucose 70 - 99 mg/dL 104(H) 98 98  BUN 8 - 23 mg/dL 22 20 16   Creatinine 0.61 - 1.24 mg/dL 1.21 1.00 1.11  BUN/Creat Ratio 10 - 24 - - 14  Sodium 135 - 145 mmol/L 141 142 143  Potassium 3.5 - 5.1 mmol/L 4.2 4.0 4.6  Chloride 98 - 111 mmol/L 107 107 102  CO2 22 - 32 mmol/L 25 - 25  Calcium 8.9 - 10.3 mg/dL 9.3 - 10.0    Assessment / Plan:     68 yo man with history of AVR/CABG one year ago. Needs urgent surgery for aortic root dissection. 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 I think he could be safely monitored in ICU with strict BP control and plan for urgent surgery in the morning.      I  spent 30 minutes counseling the patient face to face. His questions at present have been answered to his apparent satisfaction and he wishes to proceed with this plan including delayed OR until 8/12   B. Murvin Natal, MD, FACS [phone] 09/20/2018 12:50 PM

## 2018-09-20 NOTE — ED Triage Notes (Signed)
Pt sent by cardiologist for further evaluation of aortic dissection (annual screening since discovery of same last year post valve replacement) found to be increased in size by 1 cm since last july; denies CP, sob

## 2018-09-20 NOTE — ED Provider Notes (Signed)
[facility] EMERGENCY DEPARTMENT Provider Note   CSN: [id number] Arrival date & time: 09/20/18  1149    History   Chief Complaint Chief Complaint  Patient presents with  . Thoracic Aortic Dissection    HPI Danny Odom is a 68 y.o. male.     Patient found on outpatient CT scan to have increase in size of his known aortic aneurysm with concern for acute dissection.  He denies any symptoms including chest pain, shortness of breath.  This CT scan was ordered as an annual scan.  The history is provided by the patient.  Illness Location:  Chest Severity:  Mild Onset quality:  Gradual Timing:  Constant Progression:  Unchanged Chronicity:  Chronic Associated symptoms: no abdominal pain, no chest pain, no cough, no ear pain, no fever, no rash, no shortness of breath, no sore throat and no vomiting     Past Medical History:  Diagnosis Date  . Aortic stenosis 2014   08-2017  severe;   s/p  avr   . Arthritis    back and joints  . Cardiac murmur   . Coronary artery disease cardiologist-- dr Stanford Breed   10-01-2017  s/p  cabg x2  . ED (erectile dysfunction)   . Hyperlipidemia   . Hypertension   . Hypothyroid    followed by pcp  . Nocturia   . Primary prostate cancer with metastasis from prostate to other site [facility]) urologist-- dr Tresa Moore   first dx 08-01-2013;  Stage T3a, gleason 3+4;  s/p  radical prostatectomy w/ node dissection 10-20-2013  and completed post surgery IMRT  04-19-2014  . S/P aortic valve replacement 10/01/2017   edwards pericardial valve  . S/P CABG x 2 10/01/2017   LIMA to Diagonal;   SVG to OM  . S/P radiation therapy 03/05/2014 through 04/19/2014   Prostate bed 6600 cGy in 33 sessions, pelvic lymph nodes 5000 445 cGy in 33 sessions   . Wears glasses     Patient Active Problem List   Diagnosis Date Noted  . Aortic dissection ([place]) 09/20/2018  . S/P AVR 10/01/2017  . Severe aortic valve stenosis 08/23/2017   . Hypertension   . Cardiac murmur   . Malignant neoplasm of prostate ([place]) 10/20/2013    Past Surgical History:  Procedure Laterality Date  . AORTIC VALVE REPLACEMENT N/A 10/01/2017   Procedure: AORTIC VALVE REPLACEMENT (AVR);  Surgeon: Gaye Pollack, MD;  Location: [facility];  Service: Open Heart Surgery;  Laterality: N/A;  . COLONOSCOPY    . CORONARY ARTERY BYPASS GRAFT N/A 10/01/2017   Procedure: CORONARY ARTERY BYPASS GRAFTING (CABG) x two, using left internal mammary artery and right leg greater saphenous vein harvested endoscopically;  Surgeon: Gaye Pollack, MD;  Location: [facility] OR;  Service: Open Heart Surgery;  Laterality: N/A;  . LYMPHADENECTOMY Bilateral 10/20/2013   Procedure: BILATERAL PELVIC LYMPH NODE DISSECTION;  Surgeon: Alexis Frock, MD;  Location: [facility];  Service: Urology;  Laterality: Bilateral;  . ORCHIECTOMY Bilateral 08/26/2018   Procedure: ORCHIECTOMY;  Surgeon: Alexis Frock, MD;  Location: [facility];  Service: Urology;  Laterality: Bilateral;  . RIGHT/LEFT HEART CATH AND CORONARY ANGIOGRAPHY N/A 08/23/2017   Procedure: RIGHT/LEFT HEART CATH AND CORONARY ANGIOGRAPHY;  Surgeon: Leonie Man, MD;  Location: [facility] CV LAB;  Service: Cardiovascular;  Laterality: N/A;  . ROBOT ASSISTED LAPAROSCOPIC RADICAL PROSTATECTOMY N/A 10/20/2013   Procedure: ROBOTIC ASSISTED LAPAROSCOPIC RADICAL PROSTATECTOMY WITH INDOCYANINE GREEN DYE;  Surgeon: Alexis Frock, MD;  Location: [facility];  Service: Urology;  Laterality: N/A;  . TEE WITHOUT CARDIOVERSION N/A 10/01/2017   Procedure: TRANSESOPHAGEAL ECHOCARDIOGRAM (TEE);  Surgeon: Gaye Pollack, MD;  Location: [facility];  Service: Open Heart Surgery;  Laterality: N/A;  . TONSILLECTOMY  child  . varicocele surgery  Right 1983 aaprx        Home Medications    Prior to Admission medications   Medication Sig Start Date End Date Taking? Authorizing Provider  atorvastatin (LIPITOR) 40 MG tablet Take 1 tablet (40 mg  total) by mouth daily at 6 PM. Patient taking differently: Take 40 mg by mouth at bedtime.  10/20/17   Lendon Colonel, NP  diclofenac (VOLTAREN) 75 MG EC tablet Take 1 tablet (75 mg total) by mouth 2 (two) times daily as needed. 08/26/18   Alexis Frock, MD  levothyroxine (SYNTHROID, LEVOTHROID) 75 MCG tablet Take 75 mcg by mouth daily before breakfast.     [provider]  losartan (COZAAR) 50 MG tablet Take 1 tablet (50 mg total) by mouth daily. Patient taking differently: Take 50 mg by mouth at bedtime.  01/25/18 08/25/18  [name], MD  metoprolol tartrate (LOPRESSOR) 25 MG tablet Take 0.5 tablets (12.5 mg total) by mouth 2 (two) times daily. 10/20/17   Lendon Colonel, NP  Multiple Vitamin (MULTIVITAMIN WITH MINERALS) TABS tablet Take 1 tablet by mouth daily with breakfast. ONE-A-DAY MEN'S 50+    [provider]  oxyCODONE-acetaminophen (PERCOCET) 5-325 MG tablet Take 1-2 tablets by mouth every 6 (six) hours as needed for moderate pain or severe pain. Post-operatively 08/26/18 08/26/19  Alexis Frock, MD  senna-docusate (SENOKOT-S) 8.6-50 MG tablet Take 1 tablet by mouth 2 (two) times daily. While taking strong pain meds to prevent constipation 08/26/18   Alexis Frock, MD  zinc gluconate 50 MG tablet Take 50 mg by mouth daily with breakfast.     [provider]    Family History Family History  Problem Relation Age of Onset  . Emphysema Father   . Prostate cancer Father   . Breast cancer Mother     Social History Social History   Tobacco Use  . Smoking status: Former Smoker    Packs/day: 1.00    Years: 20.00    Pack years: 20.00    Types: Cigarettes    Quit date: 02/15/1983    Years since quitting: 35.6  . Smokeless tobacco: Never Used  Substance Use Topics  . Alcohol use: Not Currently    Alcohol/week: 0.0 standard drinks    Comment: rarely   . Drug use: Never     Allergies   Patient has no known allergies.   Review of  Systems Review of Systems  Constitutional: Negative for chills and fever.  HENT: Negative for ear pain and sore throat.   Eyes: Negative for pain and visual disturbance.  Respiratory: Negative for cough and shortness of breath.   Cardiovascular: Negative for chest pain and palpitations.  Gastrointestinal: Negative for abdominal pain and vomiting.  Genitourinary: Negative for dysuria and hematuria.  Musculoskeletal: Negative for arthralgias and back pain.  Skin: Negative for color change and rash.  Neurological: Negative for seizures and syncope.  All other systems reviewed and are negative.    Physical Exam Updated Vital Signs  ED Triage Vitals  Enc Vitals Group     BP 09/20/18 1204 (!) 172/101     Pulse Rate 09/20/18 1204 (!) 58     Resp 09/20/18 1204 17  Temp 09/20/18 1204 98.7 F (37.1 C)     Temp Source 09/20/18 1204 Oral     SpO2 09/20/18 1204 96 %     Weight 09/20/18 1200 191 lb (86.6 kg)     Height 09/20/18 1200 5\' 10"  (1.778 m)     Head Circumference --      Peak Flow --      Pain Score 09/20/18 1200 0     Pain Loc --      Pain Edu? --      Excl. in [place]? --     Physical Exam Vitals signs and nursing note reviewed.  Constitutional:      General: He is not in acute distress.    Appearance: He is well-developed. He is not ill-appearing.  HENT:     Head: Normocephalic and atraumatic.     Nose: Nose normal.     Mouth/Throat:     Mouth: Mucous membranes are moist.  Eyes:     Extraocular Movements: Extraocular movements intact.     Conjunctiva/sclera: Conjunctivae normal.     Pupils: Pupils are equal, round, and reactive to light.  Neck:     Musculoskeletal: Normal range of motion and neck supple.  Cardiovascular:     Rate and Rhythm: Normal rate and regular rhythm.     Pulses: Normal pulses.     Heart sounds: Normal heart sounds. No murmur.  Pulmonary:     Effort: Pulmonary effort is normal. No respiratory distress.     Breath sounds: Normal breath  sounds.  Abdominal:     General: There is no distension.     Palpations: Abdomen is soft.     Tenderness: There is no abdominal tenderness.  Musculoskeletal: Normal range of motion.        General: No tenderness.  Skin:    General: Skin is warm and dry.     Capillary Refill: Capillary refill takes less than 2 seconds.  Neurological:     General: No focal deficit present.     Mental Status: He is alert and oriented to person, place, and time.     Cranial Nerves: No cranial nerve deficit.     Sensory: No sensory deficit.     Motor: No weakness.     Coordination: Coordination normal.  Psychiatric:        Mood and Affect: Mood normal.      ED Treatments / Results  Labs (all labs ordered are listed, but only abnormal results are displayed) Labs Reviewed  BASIC METABOLIC PANEL - Abnormal; Notable for the following components:      Result Value   Glucose, Bld 104 (*)    All other components within normal limits  SARS CORONAVIRUS 2 (HOSPITAL ORDER, [facility])  SURGICAL PCR SCREEN  CBC WITH DIFFERENTIAL/PLATELET  PROTIME-INR  HIV ANTIBODY (ROUTINE TESTING W REFLEX)  URINALYSIS, ROUTINE W REFLEX MICROSCOPIC  BLOOD GAS, ARTERIAL  TYPE AND SCREEN  PREPARE RBC (CROSSMATCH)  PREPARE PLATELET PHERESIS    EKG EKG Interpretation  Date/Time:  Tuesday September 20 2018 11:57:54 EDT Ventricular Rate:  62 PR Interval:    QRS Duration: 96 QT Interval:  449 QTC Calculation: 456 R Axis:   73 Text Interpretation:  Sinus rhythm Probable left atrial enlargement Abnormal R-wave progression, late transition Nonspecific T abnrm, anterolateral leads Confirmed by Lennice Sites [phone]) on 09/20/2018 12:02:29 PM   Radiology Ct Angio Chest Aorta W &/or Wo Contrast  Result Date: 09/20/2018 CLINICAL DATA:  Follow-up dilated aortic root. EXAM: CT ANGIOGRAPHY CHEST WITH CONTRAST TECHNIQUE: Multidetector CT imaging of the chest was performed using the standard protocol  during bolus administration of intravenous contrast. Multiplanar CT image reconstructions and MIPs were obtained to evaluate the vascular anatomy. CONTRAST:  169mL OMNIPAQUE IOHEXOL 350 MG/ML SOLN COMPARISON:  08/30/2017 FINDINGS: Cardiovascular: Interval aortic valve replacement and CABG. There is significant progression of ascending aortic dilatation, now 5 cm in diameter with partially enhancing false lumen along the outer wall. The solitary venous graft and the native coronary ostia are spared from the dissection. No pericardial effusion. Normal diameter arch and descending segment. Mediastinum/Nodes: Negative for adenopathy or hematoma. No inflammatory changes Lungs/Pleura: Mild linear scarring and microcalcification at the lung bases. Upper Abdomen: Negative Musculoskeletal: No acute or aggressive finding These results were called by telephone at the time of interpretation on 09/20/2018 at 11:21 am to Dr. Sallyanne Kuster, who verbally acknowledged these results. The patient is being referred to the [facility]. Review of the MIP images confirms the above findings. IMPRESSION: 1. Interval (July 2019) ascending aortic dissection with partially enhancing false lumen. An associated ascending aortic aneurysm has increased from 4 cm to 5 cm diameter. 2. Interval CABG. The solitary saphenous graft is uninvolved by the dissection. 3. Interval aortic valve replacement. Electronically Signed   By: Monte Fantasia M.D.   On: 09/20/2018 11:32    Procedures .Critical Care Performed by: Lennice Sites, DO Authorized by: Lennice Sites, DO   Critical care provider statement:    Critical care time (minutes):  35   Critical care was necessary to treat or prevent imminent or life-threatening deterioration of the following conditions:  Cardiac failure   Critical care was time spent personally by me on the following activities:  Blood draw for specimens, development of treatment plan with patient or surrogate, discussions with  primary provider, evaluation of patient's response to treatment, examination of patient, ordering and performing treatments and interventions, ordering and review of radiographic studies, ordering and review of laboratory studies, pulse oximetry, re-evaluation of patient's condition and review of old charts   I assumed direction of critical care for this patient from another provider in my specialty: no     (including critical care time)  Medications Ordered in ED Medications  nicardipine (CARDENE) 20mg  in 0.86% saline 263ml IV infusion (0.1 mg/ml) (3 mg/hr Intravenous Transfusing/Transfer 09/20/18 1318)  chlorhexidine (PERIDEX) 0.12 % solution 15 mL (has no administration in time range)  temazepam (RESTORIL) capsule 15 mg (has no administration in time range)  metoprolol tartrate (LOPRESSOR) tablet 12.5 mg (has no administration in time range)  Chlorhexidine Gluconate Cloth 2 % PADS 6 each (has no administration in time range)    And  Chlorhexidine Gluconate Cloth 2 % PADS 6 each (6 each Topical Given 09/20/18 1351)  LORazepam (ATIVAN) tablet 0.5-1 mg (has no administration in time range)  dexmedetomidine (PRECEDEX) 400 MCG/100ML (4 mcg/mL) infusion (has no administration in time range)  insulin regular, human (MYXREDLIN) 100 units/ 100 mL infusion (has no administration in time range)  EPINEPHrine (ADRENALIN) 4 mg in NS 250 mL (0.016 mg/mL) premix infusion (has no administration in time range)  DOPamine (INTROPIN) 800 mg in dextrose 5 % 250 mL (3.2 mg/mL) infusion (has no administration in time range)  milrinone (PRIMACOR) 20 MG/100 ML (0.2 mg/mL) infusion (has no administration in time range)  nitroGLYCERIN 50 mg in dextrose 5 % 250 mL (0.2 mg/mL) infusion (has no administration in time range)  phenylephrine (NEOSYNEPHRINE) 20-0.9 MG/250ML-%  infusion (has no administration in time range)  potassium chloride injection 80 mEq (has no administration in time range)  heparin 30,000 units/NS 1000  mL solution for CELLSAVER (has no administration in time range)  heparin 2,500 Units, papaverine 30 mg in electrolyte-148 (PLASMALYTE-148) 500 mL irrigation (has no administration in time range)  tranexamic acid (CYKLOKAPRON) pump prime solution 173 mg (has no administration in time range)  tranexamic acid (CYKLOKAPRON) bolus via infusion - over 30 minutes 1,299 mg (has no administration in time range)  tranexamic acid (CYKLOKAPRON) 2,500 mg in sodium chloride 0.9 % 250 mL (10 mg/mL) infusion (has no administration in time range)  vancomycin (VANCOCIN) 1,500 mg in sodium chloride 0.9 % 250 mL IVPB (has no administration in time range)  cefUROXime (ZINACEF) 1.5 g in sodium chloride 0.9 % 100 mL IVPB (has no administration in time range)  cefUROXime (ZINACEF) 750 mg in sodium chloride 0.9 % 100 mL IVPB (has no administration in time range)  [place] Blood Cardioplegia vial (lidocaine/magnesium/mannitol 0.26g-4g-6.4g) (has no administration in time range)  bisacodyl (DULCOLAX) EC tablet 5 mg (5 mg Oral Given 09/20/18 1442)     Initial Impression / Assessment and Plan / ED Course  I have reviewed the triage vital signs and the nursing notes.  Pertinent labs & imaging results that were available during my care of the patient were reviewed by me and considered in my medical decision making (see chart for details).     Danny Odom is a 69 year old male with history of hypertension, status post CABG, status post aortic valve replacement who presents to the ED after having outpatient CT scan that showed concern for acute dissection of his a sending aorta.  He has had yearly CT scans to evaluate for progression of his aneurysm and today a CT scan showed a false lumen concerning for dissection.  Surprisingly, patient is asymptomatic, has no chest pain, no shortness of breath.  No signs of volume overload.  He is extremely hypertensive however and will be started on nicardipine for aggressive blood  pressure control.  Dr. Orvan Seen with cardiothoracic surgery came down to the ED to evaluate the patient and will take him to the OR tomorrow morning for repair.  Currently he is hemodynamically stable and asymptomatic.  CT surgery states that likely his heart valve has allowed him to tolerate this dissection at this standpoint.  Patient had negative coronavirus test.  Blood pressure improved on IV nicardipine infusion.  Lab work otherwise unremarkable.  Admitted to the cardiothoracic ICU in stable condition.  This chart was dictated using voice recognition software.  Despite best efforts to proofread,  errors can occur which can change the documentation meaning.    Final Clinical Impressions(s) / ED Diagnoses   Final diagnoses:  Ascending aortic dissection [facility])  Hypertensive emergency without congestive heart failure    ED Discharge Orders    None       Lennice Sites, DO 09/20/18 1450

## 2018-09-20 NOTE — Progress Notes (Signed)
Echocardiogram 2D Echocardiogram has been performed.  Danny Odom 09/20/2018, 2:30 PM

## 2018-09-21 ENCOUNTER — Inpatient Hospital Stay (HOSPITAL_COMMUNITY)
Admission: EM | Disposition: A | Discharge status: Home / Self Care | Payer: Self-pay | Source: Home / Self Care | Attending: Cardiothoracic Surgery

## 2018-09-21 ENCOUNTER — Inpatient Hospital Stay (HOSPITAL_COMMUNITY): Payer: BC Managed Care – PPO

## 2018-09-21 ENCOUNTER — Inpatient Hospital Stay (HOSPITAL_COMMUNITY): Payer: BC Managed Care – PPO | Admitting: Certified Registered Nurse Anesthetist

## 2018-09-21 DIAGNOSIS — I71019 Dissection of thoracic aorta, unspecified: Secondary | ICD-10-CM | POA: Diagnosis present

## 2018-09-21 DIAGNOSIS — I7101 Dissection of thoracic aorta: Secondary | ICD-10-CM

## 2018-09-21 HISTORY — PX: CORONARY ARTERY BYPASS GRAFT: SHX141

## 2018-09-21 HISTORY — PX: ASCENDING AORTIC ROOT REPLACEMENT: SHX5729

## 2018-09-21 HISTORY — PX: TEE WITHOUT CARDIOVERSION: SHX5443

## 2018-09-21 LAB — POCT I-STAT 7, (LYTES, BLD GAS, ICA,H+H)
Acid-Base Excess: 1 mmol/L (ref 0.0–2.0)
Acid-base deficit: 3 mmol/L — ABNORMAL HIGH (ref 0.0–2.0)
Acid-base deficit: 3 mmol/L — ABNORMAL HIGH (ref 0.0–2.0)
Acid-base deficit: 3 mmol/L — ABNORMAL HIGH (ref 0.0–2.0)
Acid-base deficit: 2 mmol/L (ref 0.0–2.0)
Acid-base deficit: 3 mmol/L — ABNORMAL HIGH (ref 0.0–2.0)
Bicarbonate: 26.8 mmol/L (ref 20.0–28.0)
Bicarbonate: 22.3 mmol/L (ref 20.0–28.0)
Bicarbonate: 22 mmol/L (ref 20.0–28.0)
Bicarbonate: 21.8 mmol/L (ref 20.0–28.0)
Bicarbonate: 22.3 mmol/L (ref 20.0–28.0)
Bicarbonate: 21.7 mmol/L (ref 20.0–28.0)
Calcium, Ion: 1.24 mmol/L (ref 1.15–1.40)
Calcium, Ion: 1 mmol/L — ABNORMAL LOW (ref 1.15–1.40)
Calcium, Ion: 0.96 mmol/L — ABNORMAL LOW (ref 1.15–1.40)
Calcium, Ion: 1 mmol/L — ABNORMAL LOW (ref 1.15–1.40)
Calcium, Ion: 1.03 mmol/L — ABNORMAL LOW (ref 1.15–1.40)
Calcium, Ion: 1.04 mmol/L — ABNORMAL LOW (ref 1.15–1.40)
HCT: 36 % — ABNORMAL LOW (ref 39.0–52.0)
HCT: 26 % — ABNORMAL LOW (ref 39.0–52.0)
HCT: 25 % — ABNORMAL LOW (ref 39.0–52.0)
HCT: 21 % — ABNORMAL LOW (ref 39.0–52.0)
HCT: 20 % — ABNORMAL LOW (ref 39.0–52.0)
HCT: 20 % — ABNORMAL LOW (ref 39.0–52.0)
Hemoglobin: 12.2 g/dL — ABNORMAL LOW (ref 13.0–17.0)
Hemoglobin: 8.8 g/dL — ABNORMAL LOW (ref 13.0–17.0)
Hemoglobin: 8.5 g/dL — ABNORMAL LOW (ref 13.0–17.0)
Hemoglobin: 7.1 g/dL — ABNORMAL LOW (ref 13.0–17.0)
Hemoglobin: 6.8 g/dL — CL (ref 13.0–17.0)
Hemoglobin: 6.8 g/dL — CL (ref 13.0–17.0)
O2 Saturation: 100 %
O2 Saturation: 100 %
O2 Saturation: 99 %
O2 Saturation: 94 %
O2 Saturation: 91 %
O2 Saturation: 95 %
Patient temperature: 36.1
Patient temperature: 36.6
Patient temperature: 37.2
Potassium: 4.3 mmol/L (ref 3.5–5.1)
Potassium: 4.2 mmol/L (ref 3.5–5.1)
Potassium: 4.6 mmol/L (ref 3.5–5.1)
Potassium: 4.5 mmol/L (ref 3.5–5.1)
Potassium: 3.6 mmol/L (ref 3.5–5.1)
Potassium: 3.5 mmol/L (ref 3.5–5.1)
Sodium: 140 mmol/L (ref 135–145)
Sodium: 141 mmol/L (ref 135–145)
Sodium: 142 mmol/L (ref 135–145)
Sodium: 143 mmol/L (ref 135–145)
Sodium: 143 mmol/L (ref 135–145)
Sodium: 143 mmol/L (ref 135–145)
TCO2: 28 mmol/L (ref 22–32)
TCO2: 24 mmol/L (ref 22–32)
TCO2: 23 mmol/L (ref 22–32)
TCO2: 23 mmol/L (ref 22–32)
TCO2: 23 mmol/L (ref 22–32)
TCO2: 23 mmol/L (ref 22–32)
pCO2 arterial: 49 mmHg — ABNORMAL HIGH (ref 32.0–48.0)
pCO2 arterial: 40.8 mmHg (ref 32.0–48.0)
pCO2 arterial: 38 mmHg (ref 32.0–48.0)
pCO2 arterial: 37.7 mmHg (ref 32.0–48.0)
pCO2 arterial: 35.8 mmHg (ref 32.0–48.0)
pCO2 arterial: 34.3 mmHg (ref 32.0–48.0)
pH, Arterial: 7.346 — ABNORMAL LOW (ref 7.350–7.450)
pH, Arterial: 7.346 — ABNORMAL LOW (ref 7.350–7.450)
pH, Arterial: 7.372 (ref 7.350–7.450)
pH, Arterial: 7.366 (ref 7.350–7.450)
pH, Arterial: 7.401 (ref 7.350–7.450)
pH, Arterial: 7.41 (ref 7.350–7.450)
pO2, Arterial: 255 mmHg — ABNORMAL HIGH (ref 83.0–108.0)
pO2, Arterial: 377 mmHg — ABNORMAL HIGH (ref 83.0–108.0)
pO2, Arterial: 149 mmHg — ABNORMAL HIGH (ref 83.0–108.0)
pO2, Arterial: 72 mmHg — ABNORMAL LOW (ref 83.0–108.0)
pO2, Arterial: 59 mmHg — ABNORMAL LOW (ref 83.0–108.0)
pO2, Arterial: 74 mmHg — ABNORMAL LOW (ref 83.0–108.0)

## 2018-09-21 LAB — POCT I-STAT 4, (NA,K, GLUC, HGB,HCT)
Glucose, Bld: 99 mg/dL (ref 70–99)
Glucose, Bld: 106 mg/dL — ABNORMAL HIGH (ref 70–99)
Glucose, Bld: 105 mg/dL — ABNORMAL HIGH (ref 70–99)
Glucose, Bld: 91 mg/dL (ref 70–99)
Glucose, Bld: 134 mg/dL — ABNORMAL HIGH (ref 70–99)
Glucose, Bld: 124 mg/dL — ABNORMAL HIGH (ref 70–99)
Glucose, Bld: 165 mg/dL — ABNORMAL HIGH (ref 70–99)
HCT: 35 % — ABNORMAL LOW (ref 39.0–52.0)
HCT: 35 % — ABNORMAL LOW (ref 39.0–52.0)
HCT: 26 % — ABNORMAL LOW (ref 39.0–52.0)
HCT: 32 % — ABNORMAL LOW (ref 39.0–52.0)
HCT: 23 % — ABNORMAL LOW (ref 39.0–52.0)
HCT: 24 % — ABNORMAL LOW (ref 39.0–52.0)
HCT: 24 % — ABNORMAL LOW (ref 39.0–52.0)
Hemoglobin: 11.9 g/dL — ABNORMAL LOW (ref 13.0–17.0)
Hemoglobin: 11.9 g/dL — ABNORMAL LOW (ref 13.0–17.0)
Hemoglobin: 10.9 g/dL — ABNORMAL LOW (ref 13.0–17.0)
Hemoglobin: 8.8 g/dL — ABNORMAL LOW (ref 13.0–17.0)
Hemoglobin: 7.8 g/dL — ABNORMAL LOW (ref 13.0–17.0)
Hemoglobin: 8.2 g/dL — ABNORMAL LOW (ref 13.0–17.0)
Hemoglobin: 8.2 g/dL — ABNORMAL LOW (ref 13.0–17.0)
Potassium: 4.2 mmol/L (ref 3.5–5.1)
Potassium: 4.1 mmol/L (ref 3.5–5.1)
Potassium: 4.1 mmol/L (ref 3.5–5.1)
Potassium: 4.1 mmol/L (ref 3.5–5.1)
Potassium: 3.7 mmol/L (ref 3.5–5.1)
Potassium: 4.5 mmol/L (ref 3.5–5.1)
Potassium: 4.5 mmol/L (ref 3.5–5.1)
Sodium: 140 mmol/L (ref 135–145)
Sodium: 141 mmol/L (ref 135–145)
Sodium: 141 mmol/L (ref 135–145)
Sodium: 141 mmol/L (ref 135–145)
Sodium: 144 mmol/L (ref 135–145)
Sodium: 142 mmol/L (ref 135–145)
Sodium: 142 mmol/L (ref 135–145)

## 2018-09-21 LAB — CBC
HCT: 39.8 % (ref 39.0–52.0)
HCT: 25.2 % — ABNORMAL LOW (ref 39.0–52.0)
HCT: 22.6 % — ABNORMAL LOW (ref 39.0–52.0)
Hemoglobin: 13.1 g/dL (ref 13.0–17.0)
Hemoglobin: 8.2 g/dL — ABNORMAL LOW (ref 13.0–17.0)
Hemoglobin: 7.6 g/dL — ABNORMAL LOW (ref 13.0–17.0)
MCH: 31.7 pg (ref 26.0–34.0)
MCH: 30.9 pg (ref 26.0–34.0)
MCH: 31.4 pg (ref 26.0–34.0)
MCHC: 32.9 g/dL (ref 30.0–36.0)
MCHC: 32.5 g/dL (ref 30.0–36.0)
MCHC: 33.6 g/dL (ref 30.0–36.0)
MCV: 96.4 fL (ref 80.0–100.0)
MCV: 95.1 fL (ref 80.0–100.0)
MCV: 93.4 fL (ref 80.0–100.0)
Platelets: 190 10*3/uL (ref 150–400)
Platelets: 78 10*3/uL — ABNORMAL LOW (ref 150–400)
Platelets: 97 10*3/uL — ABNORMAL LOW (ref 150–400)
RBC: 4.13 MIL/uL — ABNORMAL LOW (ref 4.22–5.81)
RBC: 2.65 MIL/uL — ABNORMAL LOW (ref 4.22–5.81)
RBC: 2.42 MIL/uL — ABNORMAL LOW (ref 4.22–5.81)
RDW: 14 % (ref 11.5–15.5)
RDW: 15.3 % (ref 11.5–15.5)
RDW: 15.7 % — ABNORMAL HIGH (ref 11.5–15.5)
WBC: 5 10*3/uL (ref 4.0–10.5)
WBC: 5.2 10*3/uL (ref 4.0–10.5)
WBC: 8.6 10*3/uL (ref 4.0–10.5)
nRBC: 0 % (ref 0.0–0.2)
nRBC: 0 % (ref 0.0–0.2)
nRBC: 0 % (ref 0.0–0.2)

## 2018-09-21 LAB — POCT I-STAT, CHEM 8
BUN: 12 mg/dL (ref 8–23)
Calcium, Ion: 1.04 mmol/L — ABNORMAL LOW (ref 1.15–1.40)
Chloride: 107 mmol/L (ref 98–111)
Creatinine, Ser: 0.7 mg/dL (ref 0.61–1.24)
Glucose, Bld: 147 mg/dL — ABNORMAL HIGH (ref 70–99)
HCT: 18 % — ABNORMAL LOW (ref 39.0–52.0)
Hemoglobin: 6.1 g/dL — CL (ref 13.0–17.0)
Potassium: 3.3 mmol/L — ABNORMAL LOW (ref 3.5–5.1)
Sodium: 143 mmol/L (ref 135–145)
TCO2: 20 mmol/L — ABNORMAL LOW (ref 22–32)

## 2018-09-21 LAB — GLUCOSE, CAPILLARY
Glucose-Capillary: 177 mg/dL — ABNORMAL HIGH (ref 70–99)
Glucose-Capillary: 156 mg/dL — ABNORMAL HIGH (ref 70–99)
Glucose-Capillary: 137 mg/dL — ABNORMAL HIGH (ref 70–99)
Glucose-Capillary: 129 mg/dL — ABNORMAL HIGH (ref 70–99)
Glucose-Capillary: 162 mg/dL — ABNORMAL HIGH (ref 70–99)
Glucose-Capillary: 152 mg/dL — ABNORMAL HIGH (ref 70–99)
Glucose-Capillary: 139 mg/dL — ABNORMAL HIGH (ref 70–99)

## 2018-09-21 LAB — MAGNESIUM: Magnesium: 3.1 mg/dL — ABNORMAL HIGH (ref 1.7–2.4)

## 2018-09-21 LAB — BASIC METABOLIC PANEL
Anion gap: 8 (ref 5–15)
Anion gap: 8 (ref 5–15)
BUN: 20 mg/dL (ref 8–23)
BUN: 14 mg/dL (ref 8–23)
CO2: 24 mmol/L (ref 22–32)
CO2: 23 mmol/L (ref 22–32)
Calcium: 8.7 mg/dL — ABNORMAL LOW (ref 8.9–10.3)
Calcium: 7.2 mg/dL — ABNORMAL LOW (ref 8.9–10.3)
Chloride: 106 mmol/L (ref 98–111)
Chloride: 109 mmol/L (ref 98–111)
Creatinine, Ser: 1.24 mg/dL (ref 0.61–1.24)
Creatinine, Ser: 0.97 mg/dL (ref 0.61–1.24)
GFR calc Af Amer: >60 mL/min (ref >60)
GFR calc Af Amer: >60 mL/min (ref >60)
GFR calc non Af Amer: 59 mL/min — ABNORMAL LOW (ref >60)
GFR calc non Af Amer: >60 mL/min (ref >60)
Glucose, Bld: 105 mg/dL — ABNORMAL HIGH (ref 70–99)
Glucose, Bld: 167 mg/dL — ABNORMAL HIGH (ref 70–99)
Potassium: 3.8 mmol/L (ref 3.5–5.1)
Potassium: 3.6 mmol/L (ref 3.5–5.1)
Sodium: 138 mmol/L (ref 135–145)
Sodium: 140 mmol/L (ref 135–145)

## 2018-09-21 LAB — ECHO INTRAOPERATIVE TEE
Height: 70 in
Weight: 3056 oz

## 2018-09-21 LAB — FIBRINOGEN: Fibrinogen: 194 mg/dL — ABNORMAL LOW (ref 210–475)

## 2018-09-21 LAB — PREPARE RBC (CROSSMATCH)

## 2018-09-21 LAB — PROTIME-INR
INR: 1.6 — ABNORMAL HIGH (ref 0.8–1.2)
Prothrombin Time: 18.7 seconds — ABNORMAL HIGH (ref 11.4–15.2)

## 2018-09-21 LAB — HIV ANTIBODY (ROUTINE TESTING W REFLEX): HIV Screen 4th Generation wRfx: NONREACTIVE

## 2018-09-21 LAB — PLATELET COUNT: Platelets: 117 10*3/uL — ABNORMAL LOW (ref 150–400)

## 2018-09-21 LAB — HEMOGLOBIN AND HEMATOCRIT, BLOOD
HCT: 24.1 % — ABNORMAL LOW (ref 39.0–52.0)
Hemoglobin: 7.9 g/dL — ABNORMAL LOW (ref 13.0–17.0)

## 2018-09-21 LAB — POCT ACTIVATED CLOTTING TIME: Activated Clotting Time: 231 seconds

## 2018-09-21 LAB — APTT: aPTT: 36 seconds (ref 24–36)

## 2018-09-21 SURGERY — REDO STERNOTOMY
Anesthesia: General | Site: Chest

## 2018-09-21 MED ORDER — OXYCODONE HCL 5 MG PO TABS: 5.0000 mg | ORAL_TABLET | ORAL | Status: DC | PRN

## 2018-09-21 MED ORDER — ROCURONIUM BROMIDE 10 MG/ML (PF) SYRINGE
PREFILLED_SYRINGE | INTRAVENOUS | Status: AC
  Filled 2018-09-21: qty 40

## 2018-09-21 MED ORDER — LEVOTHYROXINE SODIUM 75 MCG PO TABS
75.0000 ug | ORAL_TABLET | Freq: Every day | ORAL | Status: DC
  Administered 2018-09-22 – 2018-09-25 (×4): 75 ug via ORAL
  Filled 2018-09-21 (×4): qty 1

## 2018-09-21 MED ORDER — SODIUM CHLORIDE 0.9 % IV SOLN
INTRAVENOUS | Status: DC | PRN
  Administered 2018-09-21: 09:00:00 0.2 ug/kg/h via INTRAVENOUS

## 2018-09-21 MED ORDER — DOCUSATE SODIUM 100 MG PO CAPS
200.0000 mg | ORAL_CAPSULE | Freq: Every day | ORAL | Status: DC
  Administered 2018-09-22 – 2018-09-25 (×4): 200 mg via ORAL
  Filled 2018-09-21 (×4): qty 2

## 2018-09-21 MED ORDER — POTASSIUM CHLORIDE 10 MEQ/50ML IV SOLN
10.0000 meq | INTRAVENOUS | Status: AC
  Administered 2018-09-21 – 2018-09-22 (×3): 10 meq via INTRAVENOUS
  Filled 2018-09-21: qty 50

## 2018-09-21 MED ORDER — PROTAMINE SULFATE 10 MG/ML IV SOLN
INTRAVENOUS | Status: DC | PRN
  Administered 2018-09-21 (×6): 50 mg via INTRAVENOUS

## 2018-09-21 MED ORDER — ALBUMIN HUMAN 5 % IV SOLN
250.0000 mL | INTRAVENOUS | Status: AC | PRN
  Administered 2018-09-21: 12.5 g via INTRAVENOUS

## 2018-09-21 MED ORDER — ROCURONIUM BROMIDE 10 MG/ML (PF) SYRINGE
PREFILLED_SYRINGE | INTRAVENOUS | Status: DC | PRN
  Administered 2018-09-21 (×3): 100 mg via INTRAVENOUS

## 2018-09-21 MED ORDER — ATORVASTATIN CALCIUM 40 MG PO TABS
40.0000 mg | ORAL_TABLET | Freq: Every day | ORAL | Status: DC
  Administered 2018-09-22 – 2018-09-24 (×3): 40 mg via ORAL
  Filled 2018-09-21 (×3): qty 1

## 2018-09-21 MED ORDER — LACTATED RINGERS IV SOLN: INTRAVENOUS | Status: DC

## 2018-09-21 MED ORDER — DEXMEDETOMIDINE HCL IN NACL 200 MCG/50ML IV SOLN: 0.0000 ug/kg/h | INTRAVENOUS | Status: DC

## 2018-09-21 MED ORDER — MIDAZOLAM HCL (PF) 10 MG/2ML IJ SOLN
INTRAMUSCULAR | Status: AC
  Filled 2018-09-21: qty 2

## 2018-09-21 MED ORDER — SODIUM CHLORIDE 0.9% FLUSH
3.0000 mL | Freq: Two times a day (BID) | INTRAVENOUS | Status: DC
  Administered 2018-09-22 – 2018-09-24 (×5): 3 mL via INTRAVENOUS

## 2018-09-21 MED ORDER — FENTANYL CITRATE (PF) 250 MCG/5ML IJ SOLN
INTRAMUSCULAR | Status: DC | PRN
  Administered 2018-09-21: 50 ug via INTRAVENOUS
  Administered 2018-09-21: 250 ug via INTRAVENOUS
  Administered 2018-09-21: 100 ug via INTRAVENOUS
  Administered 2018-09-21: 50 ug via INTRAVENOUS
  Administered 2018-09-21: 650 ug via INTRAVENOUS
  Administered 2018-09-21: 250 ug via INTRAVENOUS
  Administered 2018-09-21: 150 ug via INTRAVENOUS

## 2018-09-21 MED ORDER — CHLORHEXIDINE GLUCONATE 0.12% ORAL RINSE (MEDLINE KIT): 15.0000 mL | Freq: Two times a day (BID) | OROMUCOSAL | Status: DC

## 2018-09-21 MED ORDER — EPHEDRINE 5 MG/ML INJ
INTRAVENOUS | Status: AC
  Filled 2018-09-21: qty 10

## 2018-09-21 MED ORDER — ACETAMINOPHEN 160 MG/5ML PO SOLN: 1000.0000 mg | Freq: Four times a day (QID) | ORAL | Status: DC

## 2018-09-21 MED ORDER — VANCOMYCIN HCL IN DEXTROSE 1-5 GM/200ML-% IV SOLN
1000.0000 mg | Freq: Once | INTRAVENOUS | Status: AC
  Administered 2018-09-21: 1000 mg via INTRAVENOUS
  Filled 2018-09-21: qty 200

## 2018-09-21 MED ORDER — SODIUM CHLORIDE 0.9 % IV SOLN
INTRAVENOUS | Status: DC
  Administered 2018-09-21: 18:00:00 via INTRAVENOUS

## 2018-09-21 MED ORDER — FENTANYL CITRATE (PF) 250 MCG/5ML IJ SOLN
INTRAMUSCULAR | Status: AC
  Filled 2018-09-21: qty 5

## 2018-09-21 MED ORDER — FENTANYL CITRATE (PF) 250 MCG/5ML IJ SOLN
INTRAMUSCULAR | Status: AC
  Filled 2018-09-21: qty 25

## 2018-09-21 MED ORDER — SODIUM CHLORIDE 0.9 % IV SOLN
INTRAVENOUS | Status: DC | PRN
  Administered 2018-09-21: 14:00:00 750 mg via INTRAVENOUS

## 2018-09-21 MED ORDER — ACETAMINOPHEN 650 MG RE SUPP
650.0000 mg | Freq: Once | RECTAL | Status: AC
  Administered 2018-09-21: 650 mg via RECTAL

## 2018-09-21 MED ORDER — LACTATED RINGERS IV SOLN: 500.0000 mL | Freq: Once | INTRAVENOUS | Status: DC | PRN

## 2018-09-21 MED ORDER — PROTAMINE SULFATE 10 MG/ML IV SOLN
INTRAVENOUS | Status: AC
  Filled 2018-09-21: qty 5

## 2018-09-21 MED ORDER — SODIUM CHLORIDE 0.9 % IV SOLN: 250.0000 mL | INTRAVENOUS | Status: DC

## 2018-09-21 MED ORDER — PHENYLEPHRINE 40 MCG/ML (10ML) SYRINGE FOR IV PUSH (FOR BLOOD PRESSURE SUPPORT)
PREFILLED_SYRINGE | INTRAVENOUS | Status: AC
  Filled 2018-09-21: qty 20

## 2018-09-21 MED ORDER — BISACODYL 10 MG RE SUPP: 10.0000 mg | Freq: Every day | RECTAL | Status: DC

## 2018-09-21 MED ORDER — POTASSIUM CHLORIDE 10 MEQ/50ML IV SOLN: 10.0000 meq | INTRAVENOUS | Status: AC

## 2018-09-21 MED ORDER — ORAL CARE MOUTH RINSE
15.0000 mL | Freq: Two times a day (BID) | OROMUCOSAL | Status: DC
  Administered 2018-09-21 – 2018-09-24 (×6): 15 mL via OROMUCOSAL

## 2018-09-21 MED ORDER — TRAMADOL HCL 50 MG PO TABS
50.0000 mg | ORAL_TABLET | ORAL | Status: DC | PRN
  Administered 2018-09-22: 50 mg via ORAL
  Filled 2018-09-21: qty 1

## 2018-09-21 MED ORDER — SODIUM CHLORIDE 0.45 % IV SOLN
INTRAVENOUS | Status: DC | PRN
  Administered 2018-09-21: 18:00:00 via INTRAVENOUS

## 2018-09-21 MED ORDER — GLYCOPYRROLATE 0.2 MG/ML IJ SOLN
INTRAMUSCULAR | Status: DC | PRN
  Administered 2018-09-21: 0.2 mg via INTRAVENOUS

## 2018-09-21 MED ORDER — LACTATED RINGERS IV SOLN
INTRAVENOUS | Status: DC | PRN
  Administered 2018-09-21: 09:00:00 via INTRAVENOUS

## 2018-09-21 MED ORDER — INSULIN REGULAR(HUMAN) IN NACL 100-0.9 UT/100ML-% IV SOLN: INTRAVENOUS | Status: DC

## 2018-09-21 MED ORDER — SODIUM CHLORIDE 0.9 % IV SOLN
1.5000 g | Freq: Two times a day (BID) | INTRAVENOUS | Status: AC
  Administered 2018-09-21 – 2018-09-23 (×4): 1.5 g via INTRAVENOUS
  Filled 2018-09-21 (×4): qty 1.5

## 2018-09-21 MED ORDER — VANCOMYCIN HCL 1000 MG IV SOLR
INTRAVENOUS | Status: DC | PRN
  Administered 2018-09-21: 13:00:00 1000 mL

## 2018-09-21 MED ORDER — SODIUM CHLORIDE 0.9 % IV SOLN
INTRAVENOUS | Status: DC | PRN
  Administered 2018-09-21: 15:00:00 20 ug/min via INTRAVENOUS

## 2018-09-21 MED ORDER — ORAL CARE MOUTH RINSE: 15.0000 mL | OROMUCOSAL | Status: DC

## 2018-09-21 MED ORDER — SUCCINYLCHOLINE CHLORIDE 20 MG/ML IJ SOLN
INTRAMUSCULAR | Status: DC | PRN
  Administered 2018-09-21: 120 mg via INTRAVENOUS

## 2018-09-21 MED ORDER — EPHEDRINE SULFATE 50 MG/ML IJ SOLN
INTRAMUSCULAR | Status: DC | PRN
  Administered 2018-09-21: 5 mg via INTRAVENOUS

## 2018-09-21 MED ORDER — ASPIRIN 81 MG PO CHEW: 324.0000 mg | CHEWABLE_TABLET | Freq: Every day | ORAL | Status: DC

## 2018-09-21 MED ORDER — LACTATED RINGERS IV SOLN
INTRAVENOUS | Status: DC
  Administered 2018-09-22: 13:00:00 via INTRAVENOUS

## 2018-09-21 MED ORDER — PROTAMINE SULFATE 10 MG/ML IV SOLN
INTRAVENOUS | Status: AC
  Filled 2018-09-21: qty 25

## 2018-09-21 MED ORDER — METOPROLOL TARTRATE 12.5 MG HALF TABLET
12.5000 mg | ORAL_TABLET | Freq: Two times a day (BID) | ORAL | Status: DC
  Administered 2018-09-22 – 2018-09-25 (×7): 12.5 mg via ORAL
  Filled 2018-09-21 (×7): qty 1

## 2018-09-21 MED ORDER — ACETAMINOPHEN 500 MG PO TABS
1000.0000 mg | ORAL_TABLET | Freq: Four times a day (QID) | ORAL | Status: DC
  Administered 2018-09-22 – 2018-09-25 (×12): 1000 mg via ORAL
  Filled 2018-09-21 (×12): qty 2

## 2018-09-21 MED ORDER — HEPARIN SODIUM (PORCINE) 1000 UNIT/ML IJ SOLN
INTRAMUSCULAR | Status: DC | PRN
  Administered 2018-09-21: 18000 [IU] via INTRAVENOUS
  Administered 2018-09-21: 5000 [IU] via INTRAVENOUS
  Administered 2018-09-21: 2000 [IU] via INTRAVENOUS
  Administered 2018-09-21: 5000 [IU] via INTRAVENOUS

## 2018-09-21 MED ORDER — SODIUM CHLORIDE (PF) 0.9 % IJ SOLN
OROMUCOSAL | Status: DC | PRN
  Administered 2018-09-21 (×3): 4 mL via TOPICAL

## 2018-09-21 MED ORDER — SODIUM CHLORIDE 0.9% IV SOLUTION: Freq: Once | INTRAVENOUS | Status: DC

## 2018-09-21 MED ORDER — MORPHINE SULFATE (PF) 2 MG/ML IV SOLN: 1.0000 mg | INTRAVENOUS | Status: DC | PRN

## 2018-09-21 MED ORDER — ASPIRIN EC 325 MG PO TBEC
325.0000 mg | DELAYED_RELEASE_TABLET | Freq: Every day | ORAL | Status: DC
  Administered 2018-09-22: 325 mg via ORAL
  Filled 2018-09-21: qty 1

## 2018-09-21 MED ORDER — LACTATED RINGERS IV SOLN
INTRAVENOUS | Status: DC | PRN
  Administered 2018-09-21 (×2): via INTRAVENOUS

## 2018-09-21 MED ORDER — MAGNESIUM SULFATE 4 GM/100ML IV SOLN
4.0000 g | Freq: Once | INTRAVENOUS | Status: AC
  Administered 2018-09-21: 18:00:00 4 g via INTRAVENOUS
  Filled 2018-09-21: qty 100

## 2018-09-21 MED ORDER — CHLORHEXIDINE GLUCONATE 0.12 % MT SOLN
15.0000 mL | OROMUCOSAL | Status: AC
  Administered 2018-09-21: 15 mL via OROMUCOSAL

## 2018-09-21 MED ORDER — HEPARIN SODIUM (PORCINE) 1000 UNIT/ML IJ SOLN
INTRAMUSCULAR | Status: AC
  Filled 2018-09-21: qty 1

## 2018-09-21 MED ORDER — PROPOFOL 10 MG/ML IV BOLUS
INTRAVENOUS | Status: DC | PRN
  Administered 2018-09-21: 60 mg via INTRAVENOUS

## 2018-09-21 MED ORDER — HEMOSTATIC AGENTS (NO CHARGE) OPTIME
TOPICAL | Status: DC | PRN
  Administered 2018-09-21 (×5): 1 via TOPICAL

## 2018-09-21 MED ORDER — DEXMEDETOMIDINE HCL IN NACL 200 MCG/50ML IV SOLN
INTRAVENOUS | Status: AC
  Filled 2018-09-21: qty 50

## 2018-09-21 MED ORDER — SODIUM CHLORIDE 0.9% IV SOLUTION
Freq: Once | INTRAVENOUS | Status: AC
  Administered 2018-09-21: 20:00:00 via INTRAVENOUS

## 2018-09-21 MED ORDER — NITROGLYCERIN IN D5W 200-5 MCG/ML-% IV SOLN: 0.0000 ug/min | INTRAVENOUS | Status: DC

## 2018-09-21 MED ORDER — VANCOMYCIN HCL 1000 MG IV SOLR
INTRAVENOUS | Status: DC | PRN
  Administered 2018-09-21: 08:00:00 1500 mg via INTRAVENOUS

## 2018-09-21 MED ORDER — FAMOTIDINE IN NACL 20-0.9 MG/50ML-% IV SOLN
20.0000 mg | Freq: Two times a day (BID) | INTRAVENOUS | Status: AC
  Administered 2018-09-21: 20 mg via INTRAVENOUS

## 2018-09-21 MED ORDER — SODIUM CHLORIDE 0.9% FLUSH: 3.0000 mL | INTRAVENOUS | Status: DC | PRN

## 2018-09-21 MED ORDER — ONDANSETRON HCL 4 MG/2ML IJ SOLN: 4.0000 mg | Freq: Four times a day (QID) | INTRAMUSCULAR | Status: DC | PRN

## 2018-09-21 MED ORDER — TRANEXAMIC ACID 1000 MG/10ML IV SOLN
INTRAVENOUS | Status: DC | PRN
  Administered 2018-09-21: 1.5 mg/kg/h via INTRAVENOUS

## 2018-09-21 MED ORDER — INSULIN REGULAR BOLUS VIA INFUSION
0.0000 [IU] | Freq: Three times a day (TID) | INTRAVENOUS | Status: DC
  Administered 2018-09-22: 09:00:00 1 [IU] via INTRAVENOUS
  Filled 2018-09-21: qty 10

## 2018-09-21 MED ORDER — BISACODYL 5 MG PO TBEC
10.0000 mg | DELAYED_RELEASE_TABLET | Freq: Every day | ORAL | Status: DC
  Administered 2018-09-22 – 2018-09-25 (×4): 10 mg via ORAL
  Filled 2018-09-21 (×4): qty 2

## 2018-09-21 MED ORDER — ACETAMINOPHEN 160 MG/5ML PO SOLN: 650.0000 mg | Freq: Once | ORAL | Status: AC

## 2018-09-21 MED ORDER — METOPROLOL TARTRATE 5 MG/5ML IV SOLN: 2.5000 mg | INTRAVENOUS | Status: DC | PRN

## 2018-09-21 MED ORDER — GLYCOPYRROLATE PF 0.2 MG/ML IJ SOSY
PREFILLED_SYRINGE | INTRAMUSCULAR | Status: AC
  Filled 2018-09-21: qty 1

## 2018-09-21 MED ORDER — METOPROLOL TARTRATE 25 MG/10 ML ORAL SUSPENSION: 12.5000 mg | Freq: Two times a day (BID) | ORAL | Status: DC

## 2018-09-21 MED ORDER — STERILE WATER FOR INJECTION IJ SOLN
INTRAMUSCULAR | Status: AC
  Filled 2018-09-21: qty 10

## 2018-09-21 MED ORDER — SODIUM CHLORIDE 0.9 % IV SOLN
INTRAVENOUS | Status: DC | PRN
  Administered 2018-09-21: 1.5 g via INTRAVENOUS

## 2018-09-21 MED ORDER — ALBUMIN HUMAN 5 % IV SOLN
INTRAVENOUS | Status: DC | PRN
  Administered 2018-09-21 (×4): via INTRAVENOUS

## 2018-09-21 MED ORDER — PANTOPRAZOLE SODIUM 40 MG PO TBEC
40.0000 mg | DELAYED_RELEASE_TABLET | Freq: Every day | ORAL | Status: DC
  Administered 2018-09-23 – 2018-09-25 (×3): 40 mg via ORAL
  Filled 2018-09-21 (×3): qty 1

## 2018-09-21 MED ORDER — MUPIROCIN 2 % EX OINT
1.0000 "application " | TOPICAL_OINTMENT | Freq: Two times a day (BID) | CUTANEOUS | Status: DC
  Administered 2018-09-21 – 2018-09-24 (×7): 1 via NASAL
  Filled 2018-09-21: qty 22

## 2018-09-21 MED ORDER — SODIUM CHLORIDE 0.9 % IV SOLN
INTRAVENOUS | Status: DC | PRN
  Administered 2018-09-21 (×2): via INTRAVENOUS

## 2018-09-21 MED ORDER — MIDAZOLAM HCL 2 MG/2ML IJ SOLN: 2.0000 mg | INTRAMUSCULAR | Status: DC | PRN

## 2018-09-21 MED ORDER — 0.9 % SODIUM CHLORIDE (POUR BTL) OPTIME
TOPICAL | Status: DC | PRN
  Administered 2018-09-21: 5000 mL

## 2018-09-21 MED ORDER — HYDROCORTISONE NA SUCCINATE PF 100 MG IJ SOLR
INTRAMUSCULAR | Status: DC | PRN
  Administered 2018-09-21: 125 mg via INTRAVENOUS

## 2018-09-21 MED ORDER — PROPOFOL 10 MG/ML IV BOLUS
INTRAVENOUS | Status: AC
  Filled 2018-09-21: qty 20

## 2018-09-21 MED ORDER — PHENYLEPHRINE HCL-NACL 20-0.9 MG/250ML-% IV SOLN: 0.0000 ug/min | INTRAVENOUS | Status: DC

## 2018-09-21 MED ORDER — MIDAZOLAM HCL 5 MG/5ML IJ SOLN
INTRAMUSCULAR | Status: DC | PRN
  Administered 2018-09-21: 2 mg via INTRAVENOUS
  Administered 2018-09-21: 5 mg via INTRAVENOUS
  Administered 2018-09-21 (×3): 1 mg via INTRAVENOUS

## 2018-09-21 SURGICAL SUPPLY — 131 items
ADAPTER CARDIO PERF ANTE/RETRO (ADAPTER) ×3 IMPLANT
ADAPTER MULTI PERFUSION 15 (ADAPTER) ×1 IMPLANT
ADH SKN CLS APL DERMABOND .7 (GAUZE/BANDAGES/DRESSINGS) ×4
ADPR PRFSN 84XANTGRD RTRGD (ADAPTER) ×2
BAG DECANTER FOR FLEXI CONT (MISCELLANEOUS) ×3 IMPLANT
BLADE CLIPPER SURG (BLADE) ×3 IMPLANT
BLADE CORE FAN STRYKER (BLADE) ×1 IMPLANT
BLADE SURG 10 STRL SS (BLADE) ×1 IMPLANT
BNDG CMPR MED 10X6 ELC LF (GAUZE/BANDAGES/DRESSINGS) ×2
BNDG ELASTIC 4X5.8 VLCR STR LF (GAUZE/BANDAGES/DRESSINGS) ×3 IMPLANT
BNDG ELASTIC 6X10 VLCR STRL LF (GAUZE/BANDAGES/DRESSINGS) ×1 IMPLANT
BNDG ELASTIC 6X5.8 VLCR STR LF (GAUZE/BANDAGES/DRESSINGS) ×3 IMPLANT
BNDG GAUZE ELAST 4 BULKY (GAUZE/BANDAGES/DRESSINGS) ×3 IMPLANT
CANISTER SUCT 3000ML PPV (MISCELLANEOUS) ×3 IMPLANT
CANNULA AORTIC ROOT 9FR (CANNULA) ×1 IMPLANT
CANNULA MC2 2 STG 36/46 NON-V (CANNULA) IMPLANT
CANNULA SUMP PERICARDIAL (CANNULA) ×1 IMPLANT
CANNULA VENOUS 2 STG 34/46 (CANNULA) ×1
CANNULA VESSEL 3MM BLUNT TIP (CANNULA) ×1 IMPLANT
CATH HEART VENT LEFT (CATHETERS) IMPLANT
CATH RETROPLEGIA CORONARY 14FR (CATHETERS) ×1 IMPLANT
CATH ROBINSON RED A/P 18FR (CATHETERS) ×7 IMPLANT
CLIP RETRACTION 3.0MM CORONARY (MISCELLANEOUS) ×3 IMPLANT
CLIP VESOCCLUDE LG 6/CT (CLIP) ×2 IMPLANT
CLIP VESOCCLUDE SM WIDE 24/CT (CLIP) ×1 IMPLANT
CONN 1/2X1/2X1/2  BEN (MISCELLANEOUS) ×1
CONN 1/2X1/2X1/2 BEN (MISCELLANEOUS) IMPLANT
CONN 3/8X1/2 ST GISH (MISCELLANEOUS) ×1 IMPLANT
CONN 3/8X3/8 GISH STERILE (MISCELLANEOUS) ×1 IMPLANT
CONN ST 1/4X3/8  BEN (MISCELLANEOUS) ×3
CONN ST 1/4X3/8 BEN (MISCELLANEOUS) IMPLANT
CONT SPEC 4OZ CLIKSEAL STRL BL (MISCELLANEOUS) ×2 IMPLANT
COVER PROBE W GEL 5X96 (DRAPES) ×1 IMPLANT
DERMABOND ADVANCED (GAUZE/BANDAGES/DRESSINGS) ×2
DERMABOND ADVANCED .7 DNX12 (GAUZE/BANDAGES/DRESSINGS) ×2 IMPLANT
DRAIN CHANNEL 28F RND 3/8 FF (WOUND CARE) ×9 IMPLANT
DRAPE CARDIOVASCULAR INCISE (DRAPES) ×3
DRAPE SLUSH/WARMER DISC (DRAPES) ×3 IMPLANT
DRAPE SRG 135X102X78XABS (DRAPES) ×2 IMPLANT
DRSG AQUACEL AG ADV 3.5X14 (GAUZE/BANDAGES/DRESSINGS) ×3 IMPLANT
ELECT BLADE 4.0 EZ CLEAN MEGAD (MISCELLANEOUS) ×3
ELECT CAUTERY BLADE 6.4 (BLADE) ×4 IMPLANT
ELECT REM PT RETURN 9FT ADLT (ELECTROSURGICAL) ×6
ELECTRODE BLDE 4.0 EZ CLN MEGD (MISCELLANEOUS) IMPLANT
ELECTRODE REM PT RTRN 9FT ADLT (ELECTROSURGICAL) ×4 IMPLANT
FELT TEFLON 1X6 (MISCELLANEOUS) ×7 IMPLANT
FEMORAL VENOUS CANN RAP (CANNULA) ×1 IMPLANT
GAUZE SPONGE 4X4 12PLY STRL (GAUZE/BANDAGES/DRESSINGS) ×6 IMPLANT
GAUZE SPONGE 4X4 16PLY XRAY LF (GAUZE/BANDAGES/DRESSINGS) ×2 IMPLANT
GLOVE BIO SURGEON STRL SZ 6.5 (GLOVE) ×1 IMPLANT
GLOVE BIOGEL PI IND STRL 7.0 (GLOVE) IMPLANT
GLOVE BIOGEL PI INDICATOR 7.0 (GLOVE) ×2
GLOVE NEODERM STRL 7.5 LF PF (GLOVE) ×6 IMPLANT
GLOVE SURG NEODERM 7.5  LF PF (GLOVE) ×3
GLOVE SURG SIGNA 7.5 PF LTX (GLOVE) ×1 IMPLANT
GOWN SPEC L4 XLG W/TWL (GOWN DISPOSABLE) ×1 IMPLANT
GOWN STRL REUS W/ TWL LRG LVL3 (GOWN DISPOSABLE) ×8 IMPLANT
GOWN STRL REUS W/ TWL XL LVL3 (GOWN DISPOSABLE) IMPLANT
GOWN STRL REUS W/TWL LRG LVL3 (GOWN DISPOSABLE) ×18
GOWN STRL REUS W/TWL XL LVL3 (GOWN DISPOSABLE) ×9
GRAFT CV 30X8WVN NDL (Graft) IMPLANT
GRAFT HEMASHIELD 8MM (Graft) ×3 IMPLANT
GRAFT WOVEN D/V 32DX30L (Vascular Products) ×1 IMPLANT
HEMOSTAT POWDER SURGIFOAM 1G (HEMOSTASIS) ×9 IMPLANT
HEMOSTAT SURGICEL 2X14 (HEMOSTASIS) ×4 IMPLANT
INSERT FOGARTY SM (MISCELLANEOUS) ×2 IMPLANT
INSERT FOGARTY XLG (MISCELLANEOUS) ×2 IMPLANT
KIT BASIN OR (CUSTOM PROCEDURE TRAY) ×3 IMPLANT
KIT DILATOR VASC 18G NDL (KITS) ×1 IMPLANT
KIT DRAINAGE VACCUM ASSIST (KITS) ×1 IMPLANT
KIT SUCTION CATH 14FR (SUCTIONS) ×3 IMPLANT
KIT TURNOVER KIT B (KITS) ×3 IMPLANT
LINE VENT (MISCELLANEOUS) ×1 IMPLANT
LOOP VESSEL MAXI BLUE (MISCELLANEOUS) ×1 IMPLANT
LOOP VESSEL SUPERMAXI WHITE (MISCELLANEOUS) ×1 IMPLANT
MARKER GRAFT CORONARY BYPASS (MISCELLANEOUS) ×9 IMPLANT
NS IRRIG 1000ML POUR BTL (IV SOLUTION) ×15 IMPLANT
PACK E OPEN HEART (SUTURE) ×3 IMPLANT
PACK OPEN HEART (CUSTOM PROCEDURE TRAY) ×3 IMPLANT
PAD ARMBOARD 7.5X6 YLW CONV (MISCELLANEOUS) ×6 IMPLANT
PAD ELECT DEFIB RADIOL ZOLL (MISCELLANEOUS) ×3 IMPLANT
PATCH VASC XENOSURE 1CMX6CM (Vascular Products) ×3 IMPLANT
PATCH VASC XENOSURE 1X6 (Vascular Products) IMPLANT
PENCIL BUTTON HOLSTER BLD 10FT (ELECTRODE) ×3 IMPLANT
PENCIL FOOT CONTROL (ELECTRODE) ×1 IMPLANT
POSITIONER HEAD DONUT 9IN (MISCELLANEOUS) ×3 IMPLANT
POWDER SURGICEL 3.0 GRAM (HEMOSTASIS) ×1 IMPLANT
SEALANT PATCH FIBRIN 2X4IN (MISCELLANEOUS) ×1 IMPLANT
SEALANT SURG COSEAL 8ML (VASCULAR PRODUCTS) ×1 IMPLANT
SET CARDIOPLEGIA MPS 5001102 (MISCELLANEOUS) ×1 IMPLANT
SIZER VASCULAR GRAFT LG 24-38 (SIZER) ×1 IMPLANT
SPONGE LAP 18X18 X RAY DECT (DISPOSABLE) ×3 IMPLANT
SPONGE LAP 4X18 RFD (DISPOSABLE) ×1 IMPLANT
SUT BONE WAX W31G (SUTURE) ×3 IMPLANT
SUT ETHIBOND X763 2 0 SH 1 (SUTURE) ×3 IMPLANT
SUT MNCRL AB 3-0 PS2 18 (SUTURE) ×8 IMPLANT
SUT PDS AB 1 CTX 36 (SUTURE) ×6 IMPLANT
SUT PROLENE 3 0 SH DA (SUTURE) ×5 IMPLANT
SUT PROLENE 3 0 SH1 36 (SUTURE) ×2 IMPLANT
SUT PROLENE 4 0 RB 1 (SUTURE) ×15
SUT PROLENE 4 0 SH DA (SUTURE) ×6 IMPLANT
SUT PROLENE 4-0 RB1 .5 CRCL 36 (SUTURE) IMPLANT
SUT PROLENE 5 0 C 1 36 (SUTURE) ×7 IMPLANT
SUT PROLENE 6 0 C 1 30 (SUTURE) ×10 IMPLANT
SUT PROLENE 7 0 BV 1 (SUTURE) ×1 IMPLANT
SUT PROLENE 8 0 BV175 6 (SUTURE) IMPLANT
SUT PROLENE BLUE 7 0 (SUTURE) ×3 IMPLANT
SUT SILK  1 MH (SUTURE) ×3
SUT SILK 1 MH (SUTURE) IMPLANT
SUT SILK 2 0 SH CR/8 (SUTURE) IMPLANT
SUT SILK 3 0 SH CR/8 (SUTURE) IMPLANT
SUT STEEL 6MS V (SUTURE) ×3 IMPLANT
SUT STEEL SZ 6 DBL 3X14 BALL (SUTURE) ×3 IMPLANT
SUT VIC AB 2-0 CT1 27 (SUTURE) ×9
SUT VIC AB 2-0 CT1 TAPERPNT 27 (SUTURE) IMPLANT
SUT VIC AB 2-0 CTX 27 (SUTURE) IMPLANT
SUT VIC AB 3-0 X1 27 (SUTURE) ×1 IMPLANT
SYSTEM SAHARA CHEST DRAIN ATS (WOUND CARE) ×3 IMPLANT
TAPE CLOTH SURG 4X10 WHT LF (GAUZE/BANDAGES/DRESSINGS) ×1 IMPLANT
TAPE PAPER 2X10 WHT MICROPORE (GAUZE/BANDAGES/DRESSINGS) ×2 IMPLANT
TOWEL GREEN STERILE (TOWEL DISPOSABLE) ×3 IMPLANT
TOWEL GREEN STERILE FF (TOWEL DISPOSABLE) ×3 IMPLANT
TRAY FOLEY SLVR 16FR TEMP STAT (SET/KITS/TRAYS/PACK) ×3 IMPLANT
TUBE CONNECTING 20X1/4 (TUBING) ×1 IMPLANT
TUBE SUCT INTRACARD DLP 20F (MISCELLANEOUS) ×1 IMPLANT
TUBE SUCTION CARDIAC 10FR (CANNULA) ×1 IMPLANT
UNDERPAD 30X30 (UNDERPADS AND DIAPERS) ×3 IMPLANT
VENT LEFT HEART 12002 (CATHETERS) ×3
WATER STERILE IRR 1000ML POUR (IV SOLUTION) ×6 IMPLANT
WIRE EMERALD 3MM-J .035X150CM (WIRE) ×1 IMPLANT
YANKAUER SUCT BULB TIP NO VENT (SUCTIONS) ×1 IMPLANT

## 2018-09-21 NOTE — Anesthesia Procedure Notes (Signed)
Central Venous Catheter Insertion Performed by: Roderic Palau, MD, anesthesiologist Start/End8/01/2019 7:55 AM, 09/21/2018 8:10 AM Patient location: Pre-op. Preanesthetic checklist: patient identified, IV checked, site marked, risks and benefits discussed, surgical consent, monitors and equipment checked, pre-op evaluation, timeout performed and anesthesia consent Position: Trendelenburg Lidocaine 1% used for infiltration and patient sedated Hand hygiene performed , maximum sterile barriers used  and Seldinger technique used Catheter size: 9 Fr Total catheter length 10. Central line was placed.MAC introducer Procedure performed using ultrasound guided technique. Ultrasound Notes:anatomy identified, needle tip was noted to be adjacent to the nerve/plexus identified, no ultrasound evidence of intravascular and/or intraneural injection and image(s) printed for medical record Attempts: 1 Following insertion, line sutured, dressing applied and Biopatch. Post procedure assessment: blood return through all ports, free fluid flow and no air  Patient tolerated the procedure well with no immediate complications.

## 2018-09-21 NOTE — Brief Op Note (Signed)
09/20/2018 - 09/21/2018  10:41 AM  PATIENT:  Danny Odom  68 y.o. male  PRE-OPERATIVE DIAGNOSIS:  Aortic dissection Coronary artery disease  POST-OPERATIVE DIAGNOSIS:  Aortic dissection Coronary artery disease  PROCEDURE:  Procedure(s): REDO STERNOTOMY (N/A) REPAIR OF ASCENDING AORTIC ANEURYSM USING 32 MM HEMASHIELD PLATINUM WOVEN DOUBLE VELOUR VASCULAR GRAFT WITH REATTACHMENT OF PREVIOUS SAPHENOUS VEIN GRAFT  (N/A) OPEN RIGHT SAPHENOUS VEIN HARVEST (N/A) TRANSESOPHAGEAL ECHOCARDIOGRAM (TEE) (N/A)  SURGEON:  Surgeon(s) and Role:    * [name], MD - Primary    * Melrose Nakayama, MD - Assisting  PHYSICIAN ASSISTANT: WAYNE GOLD PA-C  ANESTHESIA:   general  EBL:  700 mL   BLOOD ADMINISTERED:see anesthesia and perfusion records  DRAINS: PLEURAL AND PERICARDIAL CHEST TUBES   LOCAL MEDICATIONS USED:  NONE  SPECIMEN:  Source of Specimen:  AORTIC PSEUDOANEURYSM  DISPOSITION OF SPECIMEN:  PATHOLOGY  COUNTS:  YES  TOURNIQUET:  * No tourniquets in log *  DICTATION: .Dragon Dictation  PLAN OF CARE: Admit to inpatient   PATIENT DISPOSITION:  ICU - intubated and hemodynamically stable.   Delay start of Pharmacological VTE agent (>24hrs) due to surgical blood loss or risk of bleeding: yes

## 2018-09-21 NOTE — Progress Notes (Signed)
[facility] BRIEF SICU PROGRESS NOTE  Day of Surgery  S/P Procedure(s) (LRB): REDO STERNOTOMY (N/A) REPAIR OF ASCENDING AORTIC ANEURYSM USING 32 MM HEMASHIELD PLATINUM WOVEN DOUBLE VELOUR VASCULAR GRAFT WITH REATTACHMENT OF PREVIOUS SAPHENOUS VEIN GRAFT  (N/A) OPEN RIGHT SAPHENOUS VEIN HARVEST (N/A) TRANSESOPHAGEAL ECHOCARDIOGRAM (TEE) (N/A)   Sedated on vent AAI paced w/ stable hemodynamics O2 sats 100% Chest tube output trending down Excellent UOP so far Labs okay w/ platelet count 78k CXR pending  Plan: Continue routine early postop  Rexene Alberts, MD 09/21/2018 5:35 PM

## 2018-09-21 NOTE — Progress Notes (Signed)
CRITICAL VALUE ALERT  Critical Value:  Platelets 78  Date & Time Notied:  09/21/18   1805   Provider Notified: Orvan Seen  Orders Received/Actions taken: yes

## 2018-09-21 NOTE — Progress Notes (Signed)
RT performed VC and NIF with pt results VC 1.35L and NIF -30.  RT suctioned pt and obtained blood back in suction catheter, pt also did not have a cuff leak. RN contacting MD for further instruction on whether or not to proceed with extubation. RT will continue to monitor.

## 2018-09-21 NOTE — Progress Notes (Signed)
RT assessed pt for readiness to wean to next phase of wean protocol. Pt able to hold head off of bed 20+sec, stick out tongue, wiggle toes, and squeeze RTs hand. Pt vent changed to CPAP/PSV10/5 with FIO2 40%. RN will obtain ABG in 30 minutes, and RT will reassess pt for readiness to be liberated from vent. RT will continue to monitor.

## 2018-09-21 NOTE — H&P (Signed)
History and Physical Interval Note:  09/21/2018 7:08 AM Please see my consult note on 09/20/18 for full detail Danny Odom  has presented today for surgery, with the diagnosis of Aortic dissection and history of CAD.  The various methods of treatment have been discussed with the patient and family. After consideration of risks, benefits and other options for treatment, the patient has consented to  Procedure(s): REDO STERNOTOMY (N/A) ASCENDING AORTIC ROOT REPLACEMENT (N/A) POSSIBLE REDO CORONARY ARTERY BYPASS GRAFT,POSSIBLE ENDOSCOPIC VEIN HARVEST (N/A) TRANSESOPHAGEAL ECHOCARDIOGRAM (TEE) (N/A) as a surgical intervention.  The patient's history has been reviewed, patient examined, no change in status, stable for surgery.  I have reviewed the patient's chart and labs.  Questions were answered to the patient's satisfaction.      [name]

## 2018-09-21 NOTE — Anesthesia Procedure Notes (Signed)
Arterial Line Insertion Start/End8/01/2019 8:50 AM, 09/21/2018 8:55 AM Performed by: Wilburn Cornelia, CRNA, CRNA  Patient location: OR. Patient sedated Right, radial was placed Catheter size: 20 G Hand hygiene performed  and maximum sterile barriers used   Attempts: 1 Procedure performed without using ultrasound guided technique. Following insertion, dressing applied and Biopatch. Post procedure assessment: normal  Patient tolerated the procedure well with no immediate complications.

## 2018-09-21 NOTE — Anesthesia Procedure Notes (Signed)
Procedure Name: Intubation Date/Time: 09/21/2018 8:50 AM Performed by: Gaius Ishaq T, CRNA Pre-anesthesia Checklist: Patient identified, Emergency Drugs available, Suction available and Patient being monitored Patient Re-evaluated:Patient Re-evaluated prior to induction Oxygen Delivery Method: Circle system utilized Preoxygenation: Pre-oxygenation with 100% oxygen Induction Type: IV induction and Rapid sequence Laryngoscope Size: Miller and 3 Grade View: Grade I Tube type: Oral Tube size: 8.0 mm Number of attempts: 1 Airway Equipment and Method: Patient positioned with wedge pillow and Stylet Placement Confirmation: ETT inserted through vocal cords under direct vision,  positive ETCO2 and breath sounds checked- equal and bilateral Secured at: 23 cm Tube secured with: Tape Dental Injury: Teeth and Oropharynx as per pre-operative assessment

## 2018-09-21 NOTE — Anesthesia Procedure Notes (Signed)
Central Venous Catheter Insertion Performed by: Roderic Palau, MD, anesthesiologist Start/End8/01/2019 7:55 AM, 09/21/2018 8:10 AM Patient location: Pre-op. Preanesthetic checklist: patient identified, IV checked, site marked, risks and benefits discussed, surgical consent, monitors and equipment checked, pre-op evaluation, timeout performed and anesthesia consent Hand hygiene performed  and maximum sterile barriers used  PA cath was placed.Swan type:thermodilution PA Cath depth:50 Procedure performed without using ultrasound guided technique. Attempts: 1 Patient tolerated the procedure well with no immediate complications.

## 2018-09-21 NOTE — Brief Op Note (Signed)
09/21/2018  4:51 PM  PATIENT:  Danny Odom  67 y.o. 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, chronic   PROCEDURE:  Procedure(s): REDO STERNOTOMY (N/A) REPAIR OF ASCENDING AORTIC ANEURYSM USING 32 MM HEMASHIELD PLATINUM WOVEN DOUBLE VELOUR VASCULAR GRAFT WITH REATTACHMENT OF PREVIOUS SAPHENOUS VEIN GRAFT  (N/A) OPEN RIGHT SAPHENOUS VEIN HARVEST (N/A) TRANSESOPHAGEAL ECHOCARDIOGRAM (TEE) (N/A)  HEMIARCH DISTAL RECONSTRUCTION UNDER CIRCULATORY ARREST AT 25 DEG CELSIUS FOR 13 MINUTES (ANTEGRADE CEREBRAL PERFUSION X 12 MIN)  SURGEON:  Surgeon(s) and Role:    * [name], MD - Primary    * Melrose Nakayama, MD - Assisting  PHYSICIAN ASSISTANT: Jadene Pierini PA-C  ASSISTANTS: staff   ANESTHESIA:   general  EBL:  700 mL   BLOOD ADMINISTERED:350 CC PRBC, 450 CC CELLSAVER and 2 units FFP  DRAINS: (3) Blake drain(s) in the right pleural space and mediastinum   LOCAL MEDICATIONS USED:  NONE  SPECIMEN:  Aortic pseudoaneurysm  DISPOSITION OF SPECIMEN:  PATHOLOGY  COUNTS:  YES  TOURNIQUET:  * No tourniquets in log *  DICTATION: .Note written in EPIC  PLAN OF CARE: Admit to inpatient   PATIENT DISPOSITION:  ICU - intubated and hemodynamically stable.   Delay start of Pharmacological VTE agent (>24hrs) due to surgical blood loss or risk of bleeding: yes

## 2018-09-21 NOTE — Progress Notes (Signed)
RT assessed pt for readiness to wean. Pt able to hold head off of pillow for 20+ sec, stick out tongue, wiggle toes, and squeeze RTs hand. RT placed pt on first phase of wean on SIMV/PRVC/PSV  VT 580, RR 4, PEEP 5 and FIO2 40%. Pt respiratory status stable at this time. Pt achieving good volumes and tolerating well. RT will continue to monitor.

## 2018-09-21 NOTE — Procedures (Signed)
Extubation Procedure Note  Patient Details:   Name: Danny Odom DOB: Jun 09, 1950 MRN: [id number]   Airway Documentation:    Vent end date: 09/21/18 Vent end time: 2213   Evaluation  O2 sats: stable throughout Complications: No apparent complications Patient did tolerate procedure well. Bilateral Breath Sounds: Clear, Diminished     Yes   Pt VC 1.35L NIF -30. Good bilat BS, pt able to speak name, pt had no cuff leak, MD Ricard Dillon contacted and stated to proceed with extubation at this time. Pt suctioned prior to extubation, pt able to expectorate without issue, no stridor noted. Pt placed on Elderon 6 Lpm humidified. RT will continue to monitor.   Danny Odom 09/21/2018, 10:23 PM

## 2018-09-21 NOTE — Anesthesia Procedure Notes (Signed)
Arterial Line Insertion Start/End8/01/2019 7:40 AM, 09/21/2018 7:50 AM Performed by: Sayla Golonka T, Immunologist, CRNA  Patient location: Pre-op. Lidocaine 1% used for infiltration Left, radial was placed Catheter size: 20 G Hand hygiene performed  and maximum sterile barriers used   Attempts: 1 Procedure performed without using ultrasound guided technique. Following insertion, dressing applied and Biopatch. Post procedure assessment: normal  Patient tolerated the procedure well with no immediate complications.

## 2018-09-21 NOTE — Progress Notes (Signed)
RT assessed pts ABG that had the following results. RT will assess NIF and VC for possible liberation from the vent.    Ph  7.40 PCO2 35.8 PaO2 59 HCO3 22.3

## 2018-09-21 NOTE — Anesthesia Postprocedure Evaluation (Signed)
Anesthesia Post Note  Patient: Danny Odom  Procedure(s) Performed: REDO STERNOTOMY (N/A ) REPAIR OF ASCENDING AORTIC ANEURYSM USING 32 MM HEMASHIELD PLATINUM WOVEN DOUBLE VELOUR VASCULAR GRAFT WITH REATTACHMENT OF PREVIOUS SAPHENOUS VEIN GRAFT  (N/A ) OPEN RIGHT SAPHENOUS VEIN HARVEST (N/A Chest) TRANSESOPHAGEAL ECHOCARDIOGRAM (TEE) (N/A )     Patient location during evaluation: SICU Anesthesia Type: General Level of consciousness: sedated Pain management: pain level controlled Vital Signs Assessment: post-procedure vital signs reviewed and stable Respiratory status: patient remains intubated per anesthesia plan Cardiovascular status: stable Postop Assessment: no apparent nausea or vomiting Anesthetic complications: no    Last Vitals:  Vitals:   09/21/18 0600 09/21/18 1640  BP: 109/73 100/64  Pulse: 62   Resp: 14 14  Temp:    SpO2: 99% 96%    Last Pain:  Vitals:   09/21/18 0400  TempSrc:   PainSc: 0-No pain                 Angeli Demilio,W. EDMOND

## 2018-09-21 NOTE — Transfer of Care (Signed)
Immediate Anesthesia Transfer of Care Note  Patient: Danny Odom  Procedure(s) Performed: REDO STERNOTOMY (N/A ) REPAIR OF ASCENDING AORTIC ANEURYSM USING 32 MM HEMASHIELD PLATINUM WOVEN DOUBLE VELOUR VASCULAR GRAFT WITH REATTACHMENT OF PREVIOUS SAPHENOUS VEIN GRAFT  (N/A ) OPEN RIGHT SAPHENOUS VEIN HARVEST (N/A Chest) TRANSESOPHAGEAL ECHOCARDIOGRAM (TEE) (N/A )  Patient Location: SICU  Anesthesia Type:General  Level of Consciousness: Patient remains intubated per anesthesia plan  Airway & Oxygen Therapy: Patient remains intubated per anesthesia plan and Patient placed on Ventilator (see vital sign flow sheet for setting)  Post-op Assessment: Report given to RN and Post -op Vital signs reviewed and stable  Post vital signs: Reviewed and stable  Last Vitals:  Vitals Value Taken Time  BP 100/64 09/21/18 1640  Temp 36.1 C 09/21/18 1656  Pulse 86 09/21/18 1656  Resp 17 09/21/18 1656  SpO2 94 % 09/21/18 1656  Vitals shown include unvalidated device data.  Last Pain:  Vitals:   09/21/18 0400  TempSrc:   PainSc: 0-No pain         Complications: No apparent anesthesia complications

## 2018-09-22 ENCOUNTER — Encounter (HOSPITAL_COMMUNITY): Payer: Self-pay | Admitting: Cardiothoracic Surgery

## 2018-09-22 ENCOUNTER — Inpatient Hospital Stay (HOSPITAL_COMMUNITY): Payer: BC Managed Care – PPO

## 2018-09-22 LAB — GLUCOSE, CAPILLARY
Glucose-Capillary: 126 mg/dL — ABNORMAL HIGH (ref 70–99)
Glucose-Capillary: 97 mg/dL (ref 70–99)
Glucose-Capillary: 123 mg/dL — ABNORMAL HIGH (ref 70–99)
Glucose-Capillary: 135 mg/dL — ABNORMAL HIGH (ref 70–99)
Glucose-Capillary: 116 mg/dL — ABNORMAL HIGH (ref 70–99)
Glucose-Capillary: 120 mg/dL — ABNORMAL HIGH (ref 70–99)
Glucose-Capillary: 118 mg/dL — ABNORMAL HIGH (ref 70–99)
Glucose-Capillary: 120 mg/dL — ABNORMAL HIGH (ref 70–99)
Glucose-Capillary: 119 mg/dL — ABNORMAL HIGH (ref 70–99)

## 2018-09-22 LAB — PREPARE FRESH FROZEN PLASMA
Unit division: 0
Unit division: 0

## 2018-09-22 LAB — CBC
HCT: 21.4 % — ABNORMAL LOW (ref 39.0–52.0)
HCT: 24.4 % — ABNORMAL LOW (ref 39.0–52.0)
Hemoglobin: 7 g/dL — ABNORMAL LOW (ref 13.0–17.0)
Hemoglobin: 8.2 g/dL — ABNORMAL LOW (ref 13.0–17.0)
MCH: 30.7 pg (ref 26.0–34.0)
MCH: 30.8 pg (ref 26.0–34.0)
MCHC: 32.7 g/dL (ref 30.0–36.0)
MCHC: 33.6 g/dL (ref 30.0–36.0)
MCV: 93.9 fL (ref 80.0–100.0)
MCV: 91.7 fL (ref 80.0–100.0)
Platelets: 87 10*3/uL — ABNORMAL LOW (ref 150–400)
Platelets: 87 10*3/uL — ABNORMAL LOW (ref 150–400)
RBC: 2.28 MIL/uL — ABNORMAL LOW (ref 4.22–5.81)
RBC: 2.66 MIL/uL — ABNORMAL LOW (ref 4.22–5.81)
RDW: 16 % — ABNORMAL HIGH (ref 11.5–15.5)
RDW: 16.5 % — ABNORMAL HIGH (ref 11.5–15.5)
WBC: 6.9 10*3/uL (ref 4.0–10.5)
WBC: 10.8 10*3/uL — ABNORMAL HIGH (ref 4.0–10.5)
nRBC: 0 % (ref 0.0–0.2)
nRBC: 0 % (ref 0.0–0.2)

## 2018-09-22 LAB — PREPARE CRYOPRECIPITATE
Unit division: 0
Unit division: 0

## 2018-09-22 LAB — BPAM PLATELET PHERESIS
Blood Product Expiration Date: 202008142359
ISSUE DATE / TIME: 202008121941
Unit Type and Rh: 6200

## 2018-09-22 LAB — PREPARE PLATELET PHERESIS: Unit division: 0

## 2018-09-22 LAB — BPAM FFP
Blood Product Expiration Date: 202008172359
Blood Product Expiration Date: 202008172359
ISSUE DATE / TIME: 202008121436
ISSUE DATE / TIME: 202008121439
Unit Type and Rh: 5100
Unit Type and Rh: 5100

## 2018-09-22 LAB — PREPARE RBC (CROSSMATCH)

## 2018-09-22 LAB — BASIC METABOLIC PANEL
Anion gap: 8 (ref 5–15)
Anion gap: 8 (ref 5–15)
BUN: 11 mg/dL (ref 8–23)
BUN: 12 mg/dL (ref 8–23)
CO2: 24 mmol/L (ref 22–32)
CO2: 23 mmol/L (ref 22–32)
Calcium: 7.6 mg/dL — ABNORMAL LOW (ref 8.9–10.3)
Calcium: 7.9 mg/dL — ABNORMAL LOW (ref 8.9–10.3)
Chloride: 108 mmol/L (ref 98–111)
Chloride: 101 mmol/L (ref 98–111)
Creatinine, Ser: 0.98 mg/dL (ref 0.61–1.24)
Creatinine, Ser: 1.09 mg/dL (ref 0.61–1.24)
GFR calc Af Amer: >60 mL/min (ref >60)
GFR calc Af Amer: >60 mL/min (ref >60)
GFR calc non Af Amer: >60 mL/min (ref >60)
GFR calc non Af Amer: >60 mL/min (ref >60)
Glucose, Bld: 122 mg/dL — ABNORMAL HIGH (ref 70–99)
Glucose, Bld: 117 mg/dL — ABNORMAL HIGH (ref 70–99)
Potassium: 4.1 mmol/L (ref 3.5–5.1)
Potassium: 4.1 mmol/L (ref 3.5–5.1)
Sodium: 140 mmol/L (ref 135–145)
Sodium: 132 mmol/L — ABNORMAL LOW (ref 135–145)

## 2018-09-22 LAB — BPAM CRYOPRECIPITATE
Blood Product Expiration Date: 202008122010
Blood Product Expiration Date: 202008122010
ISSUE DATE / TIME: 202008121434
ISSUE DATE / TIME: 202008121434
Unit Type and Rh: 5100
Unit Type and Rh: 5100

## 2018-09-22 LAB — MAGNESIUM
Magnesium: 2.6 mg/dL — ABNORMAL HIGH (ref 1.7–2.4)
Magnesium: 2.3 mg/dL (ref 1.7–2.4)

## 2018-09-22 MED ORDER — FUROSEMIDE 10 MG/ML IJ SOLN
40.0000 mg | Freq: Once | INTRAMUSCULAR | Status: AC
  Administered 2018-09-22: 40 mg via INTRAVENOUS
  Filled 2018-09-22: qty 4

## 2018-09-22 MED ORDER — SODIUM CHLORIDE 0.9% IV SOLUTION
Freq: Once | INTRAVENOUS | Status: AC
  Administered 2018-09-22: 09:00:00 via INTRAVENOUS

## 2018-09-22 MED ORDER — INSULIN ASPART 100 UNIT/ML ~~LOC~~ SOLN: 0.0000 [IU] | SUBCUTANEOUS | Status: DC

## 2018-09-22 MED ORDER — CHLORHEXIDINE GLUCONATE CLOTH 2 % EX PADS
6.0000 | MEDICATED_PAD | Freq: Every day | CUTANEOUS | Status: DC
  Administered 2018-09-22 – 2018-09-24 (×3): 6 via TOPICAL

## 2018-09-22 NOTE — Op Note (Signed)
CARDIOTHORACIC SURGERY OPERATIVE NOTE  Date of Procedure: 09/21/18  Preoperative Diagnosis: S/p AVR/ CABG in 8/19; aortic dissection   Postoperative Diagnosis: Proximal aortic pseudoaneurysm Procedure:   Redo median sternotomy, right axillary artery cannulation, patch angioplasty of right common femoral artery, replacement of a sending aorta with a 32 millimeters dacryon graft (super coronary implantation proximally and distal hemi-arch reconstruction under hypothermic circulatory arrest (25 C for 12 minutes using antegrade cerebral perfusion) open Vein Harvest from right Lower Leg  Surgeon: Orvan Seen, B. Zane MD  Assistant: Merilynn Finland MD Jadene Pierini PA-C  Anesthesia: get  Operative Findings:  Large pseudoaneurysm approximately 2 cm above the level of the sinotubular junction.  This arose and 2 distinct defects within the native aorta    BRIEF CLINICAL NOTE AND INDICATIONS FOR SURGERY  68 year old gentleman status post aVR CABG approximately 1 year ago.  He presented for surveillance CT scanning which demonstrated aortic dissection versus contained rupture.  He is taken to the operating room for urgent repair   DETAILS OF THE OPERATIVE PROCEDURE  Preparation:  The patient is brought to the operating room on the above mentioned date and central monitoring was established by the anesthesia team including placement of Swan-Ganz catheter and radial arterial line. The patient is placed in the supine position on the operating table.  Intravenous antibiotics are administered. General endotracheal anesthesia is induced uneventfully. A Foley catheter is placed.  Baseline transesophageal echocardiogram was performed.  Findings were notable for well-seated aortic valve and mild to moderate mitral regurgitation with good lbiventricular function  The patient's chest, abdomen, both groins, and both lower extremities are prepared and draped in a sterile manner. A time out procedure is  performed.   Surgical Approach and Conduit Harvest:  Attention was first turned to the right infraclavicular region where a incision was made overlying the right deltopectoral groove.  Dissection was carried down to the subcutaneous tissue using electrocautery. The pectoralis major and minor muscles were divided.  The right axillary artery was encountered and encircled.  5000 units of heparin were given intravenously.  A 10 mm dacryon graft was brought into the field and sewn end-to-side to the axillary artery with a running suture of 5-0 Prolene.  This was then de-aired and attached to the arterial limb of the cardiopulmonary bypass machine.   Next the SonoSite ultrasound was used to identify the femoral vein on the right side at the level of the groin.  This was accessed and a wire was advanced into the central circulation but the anatomy was somewhat obscured by the aortic process impinging upon the right atrial structures. After serial dilation of the tract it was finally determined that the wire was in the arterial system. A small transverse incision was made in the groin overlying the palpable femoral pulse.  Proximal and distal control was obtained.  The artery was inspected and was found to be free of dissection.  A patch angioplasty repair was then performed with bovine pericardium.  This resulted in palpable DP and PT pulses at the right foot.  Through the same incision wire access was obtained of the femoral vein which incidentally was noted to be posterior to the femoral artery hence the difficulty with accessing it by SonoSite.  The wire was finally advanced into the right side of the heart.  A multichannel cannula was then inserted.  This was then flushed with heparinized saline.    Redo sternotomy was then undertaken.Simultaneously, the greater saphenous vein is obtained from the patient's  right calf region using open vein harvest technique. The saphenous vein is notably good quality conduit.  After removal of the saphenous vein, the small surgical incisions in the lower extremity are closed with absorbable suture.   Extracorporeal Cardiopulmonary Bypass and Myocardial Protection: The sternum was divided with an oscillating saw and the chest was entered safely without an injury to underlying structures.  The posterior table of each side of the sternum was dissected of adhesions and a sternal retractor was placed.  Intrapericardial dissection was performed. Full dose heparin was then given.   The ascending aorta was markedly enlarged up to the level of the  head vessels' takeoff.  There was dense amount of adhesion on the left side of the hemisternum precluding attempts to isolate and clamped the internal mammary artery which was presumed to be patent.  A cardioplegia cannula is placed in the ascending aorta. A retrograde cardioplegia cannula is placed through the right atrium into the coronary sinus.  The entire pre-bypass portion of the operation was notable for stable hemodynamics.  Once the ACT was found to be greater than 400 seconds he was placed on cardiopulmonary bypass. The patient is actively cooled to 25C systemic temperature.  The aortic cross clamp is applied and KBC cardioplegia is delivered initially in an antegrade fashion through the aortic root.  Supplemental cardioplegia is given retrograde through the coronary sinus catheter.  Iced saline slush is applied for topical hypothermia.  The initial cardioplegic arrest is rapid with early diastolic arrest.  Repeat doses of cardioplegia are administered intermittently throughout the entire cross clamp portion of the operation through the aortic root and through the coronary sinus catheter in order to maintain completely flat electrocardiogram.  Myocardial protection was felt to be excellent.   Replacement of ascending aorta:   Once arrested, the a sending aorta was divided sharply.  Inspection within the opened aorta demonstrated 2  defects on the inside towards the right of the chest leading to large pseudoaneurysms which upon gross inspection were then determined to be of chronic nature.  The aorta was removed to the level of the sinotubular junction.  The coronary ostia were observed and were intact. Care was taken to avoid injury of the proximal vein graft anastomosis to the native aorta. This was marked with a prolene suture for subsequent reimplantation onto the neo-aorta.  The aortic valve replacement was normal in appearance and therefore did not require repeat aortic valve replacement.  By this time the patient had been cooling for approximately 45 minutes and the patient had been at goal temperature for several minutes. Circulatory arrest was instituted; the proximal innominate artery was clamped and antegrade cerebral perfusion was started by flowing through the right axillary artery and up the right carotid artery.  Good backflow was observed from the left carotid artery and the aortic arch and the cerebral saturations did not very markedly.  A 32 mm Hemashield Dacron graft was brought onto the field and beveled and then sewn to the underside of the arch with a running suture of 3-0 Prolene.  This was buttressed on the outside of the aorta with a felt strip.  When this completed de-airing of the arch and the graft was performed and this was reclamped.  The cervical rest time was 13 minutes with 12 minutes of antegrade cerebral perfusion.  Rewarming was then undertaken.  Attention was then returned to the proximal aorta.  A separate piece of the 32 mm Hemashield Dacron graft was sewn into  end to the proximal native aorta at the level of the sinotubular junction with a running suture of 3-0 Prolene and buttressed again on the native aorta.  When this completed the 2 ends of the Dacron were beveled and sewn together in a graft graft configuration.  This was done with a running suture of 4-0 Prolene.  Next I cautery was used to cut a  defect into the neoaorta and the circumflex coronary artery button vein graft button was reimplanted with a running suture of 6-0 Prolene.  A reanimation dose of cardioplegia was then delivered retrograde and antegrade and de-airing procedures were performed the aortic cross-clamp was removed.   The aortic cross clamp was removed after a total cross clamp time of 94 minutes. The heart resumed a bradycardiac perfusing rhythm.    Procedure Completion:  Epicardial pacing wires are fixed to the right ventricular outflow tract and to the right atrial appendage. The patient is rewarmed to 37C temperature. The patient is weaned and disconnected from cardiopulmonary bypass.  The patient's rhythm at separation from bypass was bradycardia.  The patient was weaned from cardiopulmonary bypass  without any inotropic support. Total cardiopulmonary bypass time for the operation was 169 minutes.  Followup transesophageal echocardiogram performed after separation from bypass revealed  no changes from the preoperative exam.  The peripheral vessels were decannulated and repaird uneventfully and without complications. Protamine was administered to reverse the anticoagulation. The mediastinum and pleural space were inspected for hemostasis and irrigated with saline solution. The mediastinum and right pleural space were drained using 3 blake chest tubes placed through separate stab incisions inferiorly.  The soft tissues anterior to the aorta were reapproximated loosely. The sternum is closed with double strength sternal wire. The soft tissues anterior to the sternum were closed in multiple layers and the skin is closed with a running subcuticular skin closure.    The post-bypass portion of the operation was notable for stable rhythm and hemodynamics.  Blood products were administered during the operation: 2 Units PrBC, 2units cryoprecipitate, 2 units FFP   Disposition:  The patient tolerated the procedure well and  is transported to the surgical intensive care in stable condition. All sponge instrument and needle counts are verified correct at completion of the operation.   Jayme Cloud,  MD 09/22/2018 6:32 AM

## 2018-09-22 NOTE — Progress Notes (Signed)
EVENING ROUNDS NOTE :     [address]       [place] [address]             [phone]                 1 Day Post-Op Procedure(s) (LRB): REDO STERNOTOMY (N/A) REPAIR OF ASCENDING AORTIC ANEURYSM USING 32 MM HEMASHIELD PLATINUM WOVEN DOUBLE VELOUR VASCULAR GRAFT WITH REATTACHMENT OF PREVIOUS SAPHENOUS VEIN GRAFT  (N/A) OPEN RIGHT SAPHENOUS VEIN HARVEST (N/A) TRANSESOPHAGEAL ECHOCARDIOGRAM (TEE) (N/A)   Total Length of Stay:  LOS: 2 days  Events:  Doing well.  Up to chair eating    BP 106/71 (BP Location: Right Arm)   Pulse 65   Temp 98.5 F (36.9 C) (Oral)   Resp (!) 22   Ht 5\' 10"  (1.778 m)   Wt 92.5 kg   SpO2 98%   BMI 29.26 kg/m   PAP: (19-43)/(5-25) 22/5 CO:  [3.4 L/min-7.1 L/min] 7.1 L/min CI:  [1.6 L/min/m2-3.5 L/min/m2] 3.5 L/min/m2    . cefUROXime (ZINACEF)  IV Stopped (09/22/18 0355)  . insulin 1.4 mL/hr at 09/22/18 1600  . lactated ringers    . lactated ringers    . lactated ringers 10 mL/hr at 09/22/18 1600  . nitroGLYCERIN Stopped (09/22/18 9741)  . phenylephrine (NEO-SYNEPHRINE) Adult infusion Stopped (09/21/18 2010)    I/O last 3 completed shifts: In: 6384 [P.O.:120; I.V.:4018.7; Blood:1353; IV Piggyback:1463.3] Out: 5751 [Urine:4550; Blood:700; Chest Tube:501]   CBC Latest Ref Rng & Units 09/22/2018 09/21/2018 09/21/2018  WBC 4.0 - 10.5 K/uL 6.9 - -  Hemoglobin 13.0 - 17.0 g/dL 7.0(L) 6.8(LL) 6.1(LL)  Hematocrit 39.0 - 52.0 % 21.4(L) 20.0(L) 18.0(L)  Platelets 150 - 400 K/uL 87(L) - -    BMP Latest Ref Rng & Units 09/22/2018 09/21/2018 09/21/2018  Glucose 70 - 99 mg/dL 122(H) - 147(H)  BUN 8 - 23 mg/dL 11 - 12  Creatinine 0.61 - 1.24 mg/dL 0.98 - 0.70  BUN/Creat Ratio 10 - 24 - - -  Sodium 135 - 145 mmol/L 140 143 143  Potassium 3.5 - 5.1 mmol/L 4.1 3.5 3.3(L)  Chloride 98 - 111 mmol/L 108 - 107  CO2 22 - 32 mmol/L 24 - -  Calcium 8.9 - 10.3 mg/dL 7.6(L) - -    ABG    Component Value Date/Time   PHART 7.410 09/21/2018  2325   PCO2ART 34.3 09/21/2018 2325   PO2ART 74.0 (L) 09/21/2018 2325   HCO3 21.7 09/21/2018 2325   TCO2 23 09/21/2018 2325   ACIDBASEDEF 3.0 (H) 09/21/2018 2325   O2SAT 95.0 09/21/2018 2325   POD 1 s/p redo sternotomy, ascending aortic replacement    Melodie Bouillon, MD 09/22/2018 4:48 PM

## 2018-09-22 NOTE — Addendum Note (Signed)
Addendum  created 09/22/18 1212 by Josephine Igo, CRNA   Order sets accessed

## 2018-09-22 NOTE — Progress Notes (Signed)
1 Day Post-Op Procedure(s) (LRB): REDO STERNOTOMY (N/A) REPAIR OF ASCENDING AORTIC ANEURYSM USING 32 MM HEMASHIELD PLATINUM WOVEN DOUBLE VELOUR VASCULAR GRAFT WITH REATTACHMENT OF PREVIOUS SAPHENOUS VEIN GRAFT  (N/A) OPEN RIGHT SAPHENOUS VEIN HARVEST (N/A) TRANSESOPHAGEAL ECHOCARDIOGRAM (TEE) (N/A) Subjective: No complaints  Objective: Vital signs in last 24 hours: Temp:  [96.6 F (35.9 C)-99.9 F (37.7 C)] 99.3 F (37.4 C) (08/13 0700) Pulse Rate:  [77-92] 79 (08/13 0700) Cardiac Rhythm: Normal sinus rhythm (08/13 0645) Resp:  [12-26] 17 (08/13 0700) BP: (86-161)/(63-96) 107/68 (08/13 0700) SpO2:  [86 %-100 %] 95 % (08/13 0700) Arterial Line BP: (94-270)/(52-134) 143/54 (08/13 0700) FiO2 (%):  [40 %-50 %] 40 % (08/12 2107) Weight:  [92.5 kg] 92.5 kg (08/13 0500)  Hemodynamic parameters for last 24 hours: PAP: (19-43)/(5-25) 22/8 CO:  [2.9 L/min-7.1 L/min] 7.1 L/min CI:  [1.4 L/min/m2-3.5 L/min/m2] 3.5 L/min/m2  Intake/Output from previous day: 08/12 0701 - 08/13 0700 In: 6673.7 [I.V.:3857.4; Blood:1353; IV Piggyback:1463.3] Out: 5151 [Urine:3950; Blood:700; Chest Tube:501] Intake/Output this shift: No intake/output data recorded.  General appearance: alert and cooperative Neurologic: intact Heart: regular rate and rhythm, S1, S2 normal, no murmur, click, rub or gallop Lungs: clear to auscultation bilaterally Abdomen: soft, non-tender; bowel sounds normal; no masses,  no organomegaly Extremities: warm 2+ pulses bilateral DPs Wound: dressed, dry  Lab Results: Recent Labs    09/21/18 2144  09/21/18 2325 09/22/18 0427  WBC 8.6  --   --  6.9  HGB 7.6*   < > 6.8* 7.0*  HCT 22.6*   < > 20.0* 21.4*  PLT 97*  --   --  87*   < > = values in this interval not displayed.   BMET:  Recent Labs    09/21/18 2144 09/21/18 2213 09/21/18 2325 09/22/18 0427  NA 140 143 143 140  K 3.6 3.3* 3.5 4.1  CL 109 107  --  108  CO2 23  --   --  24  GLUCOSE 167* 147*  --  122*   BUN 14 12  --  11  CREATININE 0.97 0.70  --  0.98  CALCIUM 7.2*  --   --  7.6*    PT/INR:  Recent Labs    09/21/18 1653  LABPROT 18.7*  INR 1.6*   ABG    Component Value Date/Time   PHART 7.410 09/21/2018 2325   HCO3 21.7 09/21/2018 2325   TCO2 23 09/21/2018 2325   ACIDBASEDEF 3.0 (H) 09/21/2018 2325   O2SAT 95.0 09/21/2018 2325   CBG (last 3)  Recent Labs    09/22/18 0502 09/22/18 0559 09/22/18 0657  GLUCAP 120* 118* 120*    Assessment/Plan: S/P Procedure(s) (LRB): REDO STERNOTOMY (N/A) REPAIR OF ASCENDING AORTIC ANEURYSM USING 32 MM HEMASHIELD PLATINUM WOVEN DOUBLE VELOUR VASCULAR GRAFT WITH REATTACHMENT OF PREVIOUS SAPHENOUS VEIN GRAFT  (N/A) OPEN RIGHT SAPHENOUS VEIN HARVEST (N/A) TRANSESOPHAGEAL ECHOCARDIOGRAM (TEE) (N/A) Mobilize See progression orders remove PA catheter   LOS: 2 days    [name] 09/22/2018

## 2018-09-22 NOTE — Addendum Note (Signed)
Addendum  created 09/22/18 1225 by Josephine Igo, CRNA   Order list changed

## 2018-09-23 ENCOUNTER — Inpatient Hospital Stay (HOSPITAL_COMMUNITY): Payer: BC Managed Care – PPO

## 2018-09-23 LAB — BASIC METABOLIC PANEL
Anion gap: 7 (ref 5–15)
BUN: 12 mg/dL (ref 8–23)
CO2: 25 mmol/L (ref 22–32)
Calcium: 8.1 mg/dL — ABNORMAL LOW (ref 8.9–10.3)
Chloride: 106 mmol/L (ref 98–111)
Creatinine, Ser: 1 mg/dL (ref 0.61–1.24)
GFR calc Af Amer: >60 mL/min (ref >60)
GFR calc non Af Amer: >60 mL/min (ref >60)
Glucose, Bld: 117 mg/dL — ABNORMAL HIGH (ref 70–99)
Potassium: 4 mmol/L (ref 3.5–5.1)
Sodium: 138 mmol/L (ref 135–145)

## 2018-09-23 LAB — CBC
HCT: 22.8 % — ABNORMAL LOW (ref 39.0–52.0)
Hemoglobin: 7.7 g/dL — ABNORMAL LOW (ref 13.0–17.0)
MCH: 31.6 pg (ref 26.0–34.0)
MCHC: 33.8 g/dL (ref 30.0–36.0)
MCV: 93.4 fL (ref 80.0–100.0)
Platelets: 81 10*3/uL — ABNORMAL LOW (ref 150–400)
RBC: 2.44 MIL/uL — ABNORMAL LOW (ref 4.22–5.81)
RDW: 16.3 % — ABNORMAL HIGH (ref 11.5–15.5)
WBC: 8.6 10*3/uL (ref 4.0–10.5)
nRBC: 0 % (ref 0.0–0.2)

## 2018-09-23 LAB — GLUCOSE, CAPILLARY
Glucose-Capillary: 104 mg/dL — ABNORMAL HIGH (ref 70–99)
Glucose-Capillary: 105 mg/dL — ABNORMAL HIGH (ref 70–99)
Glucose-Capillary: 108 mg/dL — ABNORMAL HIGH (ref 70–99)
Glucose-Capillary: 108 mg/dL — ABNORMAL HIGH (ref 70–99)
Glucose-Capillary: 109 mg/dL — ABNORMAL HIGH (ref 70–99)
Glucose-Capillary: 111 mg/dL — ABNORMAL HIGH (ref 70–99)
Glucose-Capillary: 113 mg/dL — ABNORMAL HIGH (ref 70–99)
Glucose-Capillary: 113 mg/dL — ABNORMAL HIGH (ref 70–99)
Glucose-Capillary: 115 mg/dL — ABNORMAL HIGH (ref 70–99)
Glucose-Capillary: 115 mg/dL — ABNORMAL HIGH (ref 70–99)
Glucose-Capillary: 122 mg/dL — ABNORMAL HIGH (ref 70–99)
Glucose-Capillary: 124 mg/dL — ABNORMAL HIGH (ref 70–99)
Glucose-Capillary: 124 mg/dL — ABNORMAL HIGH (ref 70–99)
Glucose-Capillary: 92 mg/dL (ref 70–99)
Glucose-Capillary: 97 mg/dL (ref 70–99)

## 2018-09-23 MED ORDER — FUROSEMIDE 10 MG/ML IJ SOLN
40.0000 mg | Freq: Every day | INTRAMUSCULAR | Status: DC
  Administered 2018-09-23 – 2018-09-24 (×2): 40 mg via INTRAVENOUS
  Filled 2018-09-23 (×2): qty 4

## 2018-09-23 MED ORDER — ASPIRIN EC 81 MG PO TBEC
81.0000 mg | DELAYED_RELEASE_TABLET | Freq: Every day | ORAL | Status: DC
  Administered 2018-09-23 – 2018-09-25 (×3): 81 mg via ORAL
  Filled 2018-09-23 (×3): qty 1

## 2018-09-23 MED FILL — Albumin, Human Inj 5%: INTRAVENOUS | Qty: 250 | Status: AC

## 2018-09-23 MED FILL — Lidocaine HCl Local Preservative Free (PF) Inj 2%: INTRAMUSCULAR | Qty: 15 | Status: AC

## 2018-09-23 MED FILL — Mannitol IV Soln 20%: INTRAVENOUS | Qty: 500 | Status: AC

## 2018-09-23 MED FILL — Heparin Sodium (Porcine) Inj 1000 Unit/ML: INTRAMUSCULAR | Qty: 10 | Status: AC

## 2018-09-23 MED FILL — Dexmedetomidine HCl in NaCl 0.9% IV Soln 400 MCG/100ML: INTRAVENOUS | Qty: 100 | Status: AC

## 2018-09-23 MED FILL — Electrolyte-R (PH 7.4) Solution: INTRAVENOUS | Qty: 3000 | Status: AC

## 2018-09-23 MED FILL — Sodium Bicarbonate IV Soln 8.4%: INTRAVENOUS | Qty: 100 | Status: AC

## 2018-09-23 MED FILL — Heparin Sodium (Porcine) Inj 1000 Unit/ML: INTRAMUSCULAR | Qty: 30 | Status: AC

## 2018-09-23 MED FILL — Sodium Chloride IV Soln 0.9%: INTRAVENOUS | Qty: 2000 | Status: AC

## 2018-09-23 MED FILL — Potassium Chloride Inj 2 mEq/ML: INTRAVENOUS | Qty: 20 | Status: AC

## 2018-09-23 NOTE — Addendum Note (Signed)
Addendum  created 09/23/18 1326 by Roderic Palau, MD   Order list changed

## 2018-09-23 NOTE — Progress Notes (Signed)
2 Days Post-Op Procedure(s) (LRB): REDO STERNOTOMY (N/A) REPAIR OF ASCENDING AORTIC ANEURYSM USING 32 MM HEMASHIELD PLATINUM WOVEN DOUBLE VELOUR VASCULAR GRAFT WITH REATTACHMENT OF PREVIOUS SAPHENOUS VEIN GRAFT  (N/A) OPEN RIGHT SAPHENOUS VEIN HARVEST (N/A) TRANSESOPHAGEAL ECHOCARDIOGRAM (TEE) (N/A) Subjective: Without compaints  Objective: Vital signs in last 24 hours: Temp:  [98.5 F (36.9 C)-99.5 F (37.5 C)] 98.9 F (37.2 C) (08/14 0457) Pulse Rate:  [65-79] 79 (08/14 0700) Cardiac Rhythm: Normal sinus rhythm (08/14 0400) Resp:  [15-27] 23 (08/14 0700) BP: (101-141)/(66-87) 136/82 (08/14 0700) SpO2:  [92 %-100 %] 100 % (08/14 0700) Arterial Line BP: (105-162)/(43-64) 162/64 (08/14 0500) Weight:  [89.9 kg] 89.9 kg (08/14 0500)  Hemodynamic parameters for last 24 hours: PAP: (22-24)/(5-11) 22/5  Intake/Output from previous day: 08/13 0701 - 08/14 0700 In: 2175.2 [P.O.:1320; I.V.:277.8; Blood:377.5; IV Piggyback:199.9] Out: 4690 [Urine:4190; Chest Tube:500] Intake/Output this shift: No intake/output data recorded.  General appearance: alert, cooperative and no distress Neurologic: intact Heart: regular rate and rhythm, S1, S2 normal, no murmur, click, rub or gallop Wound: c/d/i  Lab Results: Recent Labs    09/22/18 1648 09/23/18 0446  WBC 10.8* 8.6  HGB 8.2* 7.7*  HCT 24.4* 22.8*  PLT 87* 81*   BMET:  Recent Labs    09/22/18 1648 09/23/18 0446  NA 132* 138  K 4.1 4.0  CL 101 106  CO2 23 25  GLUCOSE 117* 117*  BUN 12 12  CREATININE 1.09 1.00  CALCIUM 7.9* 8.1*    PT/INR:  Recent Labs    09/21/18 1653  LABPROT 18.7*  INR 1.6*   ABG    Component Value Date/Time   PHART 7.410 09/21/2018 2325   HCO3 21.7 09/21/2018 2325   TCO2 23 09/21/2018 2325   ACIDBASEDEF 3.0 (H) 09/21/2018 2325   O2SAT 95.0 09/21/2018 2325   CBG (last 3)  Recent Labs    09/22/18 2001 09/23/18 0020 09/23/18 0455  GLUCAP 92 113* 105*    Assessment/Plan: S/P  Procedure(s) (LRB): REDO STERNOTOMY (N/A) REPAIR OF ASCENDING AORTIC ANEURYSM USING 32 MM HEMASHIELD PLATINUM WOVEN DOUBLE VELOUR VASCULAR GRAFT WITH REATTACHMENT OF PREVIOUS SAPHENOUS VEIN GRAFT  (N/A) OPEN RIGHT SAPHENOUS VEIN HARVEST (N/A) TRANSESOPHAGEAL ECHOCARDIOGRAM (TEE) (N/A) Mobilize Diuresis d/c pacing wires Plan for transfer to step-down: see transfer orders   Leave chest tubes another day   LOS: 3 days    Danny Odom 09/23/2018

## 2018-09-23 NOTE — Discharge Summary (Addendum)
Physician Discharge Summary  Patient ID: Danny Odom MRN: [id number] DOB/AGE: 1950/06/17 68 y.o.  Admit date: 09/20/2018 Discharge date: 09/25/2018  Admission Diagnoses: Aortic dissection  Discharge Diagnoses:  Active Problems:   Aortic dissection (HCC)   Aortic dissection, thoracic [facility])  Patient Active Problem List   Diagnosis Date Noted  . Aortic dissection, thoracic (Danny Odom) 09/21/2018  . Aortic dissection (Danny Odom) 09/20/2018  . S/P AVR 10/01/2017  . Severe aortic valve stenosis 08/23/2017  . Hypertension   . Cardiac murmur   . Malignant neoplasm of prostate ([place]) 10/20/2013   History of present illness: The patient is a 68 year old man with a history of bicuspid aortic valve disease and coronary artery disease who underwent aortic valve replacement and CABG in August 2019 by Dr. Cyndia Bent.  He presented on the date of admission for routine surveillance CT of the chest and findings were suggestive of aortic dissection with contained rupture at the level of the aortic root.  He was sent to [facility] emergency department.  He was noted to be asymptomatic but hypertensive.  He denied any chest pain or back pain.  He did not have any shortness of breath/DOE.  He was felt to require urgent surgical repair.Danny Odom  Discharged Condition: good  Hospital Course: The patient was admitted and on 09/21/2018 taken to the operating room where he underwent the below described procedure.  He tolerated it well and was taken to the surgical intensive care unit in stable condition.  Postoperative hospital course:  Patient has done well.  He was weaned from the ventilator using standard protocols without difficulty.  He has maintained stable hemodynamics and normal sinus rhythm.  All routine lines, monitors and drainage devices have been discontinued in the standard fashion.  He does have an expected acute blood loss anemia.  He does have some postoperative volume overload and is responding well to diuretics.   Renal function was noted to be within normal limits with most recent BUN and creatinine 11/1.00.    Addendum: He has remained in NSR.  His pacing wires have been removed without difficulty.  He is ambulating independently.  His incisions are healing without evidence of infection.  He is medically stable for discharge home today.  Consults: None  Significant Diagnostic Studies: routine post-op labs and serial CXR's  Treatments: surgery:  CARDIOTHORACIC SURGERY OPERATIVE NOTE  Date of Procedure:    09/21/18  Preoperative Diagnosis:      S/p AVR/ CABG in 8/19; aortic dissection   Postoperative Diagnosis:    Proximal aortic pseudoaneurysm Procedure:       Redo median sternotomy, right axillary artery cannulation, patch angioplasty of right common femoral artery, replacement of a sending aorta with a 32 millimeters dacryon graft (super coronary implantation proximally and distal hemi-arch reconstruction under hypothermic circulatory arrest (25 C for 12 minutes using antegrade cerebral perfusion) open Vein Harvest from right Lower Leg  Surgeon:        Orvan Seen, B. Zane MD  Assistant:       Merilynn Finland MD Jadene Pierini PA-C  Anesthesia:    get  Operative Findings: ? Large pseudoaneurysm approximately 2 cm above the level of the sinotubular junction.  This arose and 2 distinct defects within the native aorta   Discharge Exam: Blood pressure 103/62, pulse 85, temperature 98.3 F (36.8 C), temperature source Oral, resp. rate (!) 21, height 5\' 10"  (1.778 m), weight 86 kg, SpO2 94 %.  BP 103/62   Pulse 85   Temp 98.3 F (  36.8 C) (Oral)   Resp (!) 21   Ht 5\' 10"  (1.778 m)   Wt 86 kg   SpO2 94%   BMI 27.20 kg/m   Gen: no apparent distress Heart: RRR Lungs: CTA Bilaterally Incisions: C/D/I  Discharge disposition: 01-Home or Self Care  Discharge Medications:   The patient has been discharged on:   1.Beta Blocker:  Yes [ X  ]                              No   [    ]                              If No, reason:  2.Ace Inhibitor/ARB: Yes [   ]                                     No  [  X  ]                                     If No, reason:  3.Statin:   Yes [ X  ]                  No  [   ]                  If No, reason:  4.Ecasa:  Yes  [ X]                  No   [   ]                  If No, reason:   Discharge Instructions    Amb Referral to Cardiac Rehabilitation   Complete by: As directed    Diagnosis: Valve Replacement   Valve: Aortic Comment - replacement of ascending aorta   After initial evaluation and assessments completed: Virtual Based Care may be provided alone or in conjunction with Phase 2 Cardiac Rehab based on patient barriers.: Yes     Allergies as of 09/25/2018   No Known Allergies     Medication List    STOP taking these medications   doxycycline 100 MG capsule Commonly known as: VIBRAMYCIN   losartan 50 MG tablet Commonly known as: COZAAR   mupirocin ointment 2 % Commonly known as: BACTROBAN   oxyCODONE-acetaminophen 5-325 MG tablet Commonly known as: Percocet     TAKE these medications   acetaminophen 500 MG tablet Commonly known as: TYLENOL Take 2 tablets (1,000 mg total) by mouth every 6 (six) hours as needed.   aspirin EC 81 MG tablet Take 81 mg by mouth daily.   atorvastatin 40 MG tablet Commonly known as: LIPITOR Take 1 tablet (40 mg total) by mouth daily at 6 PM. What changed: when to take this   diclofenac 75 MG EC tablet Commonly known as: VOLTAREN Take 1 tablet (75 mg total) by mouth 2 (two) times daily as needed. What changed:   when to take this  reasons to take this   furosemide 40 MG tablet Commonly known as: Lasix Take 1 tablet (40 mg total) by mouth daily.   levothyroxine 75 MCG tablet Commonly known as: SYNTHROID Take  75 mcg by mouth daily before breakfast.   metoprolol tartrate 25 MG tablet Commonly known as: LOPRESSOR Take 0.5 tablets (12.5 mg total) by mouth 2  (two) times daily.   multivitamin with minerals Tabs tablet Take 1 tablet by mouth daily with breakfast. ONE-A-DAY MEN'S 50+   potassium chloride 10 MEQ tablet Commonly known as: K-DUR Take 1 tablet (10 mEq total) by mouth daily.   senna-docusate 8.6-50 MG tablet Commonly known as: Senokot-S Take 1 tablet by mouth 2 (two) times daily. While taking strong pain meds to prevent constipation What changed:   when to take this  reasons to take this   traMADol 50 MG tablet Commonly known as: ULTRAM Take 1-2 tablets (50-100 mg total) by mouth every 4 (four) hours as needed for moderate pain.   zinc gluconate 50 MG tablet Take 50 mg by mouth daily with breakfast.      Follow-up Information    [name], MD Follow up.   Specialty: Cardiology Why: Please see discharge paperwork for 2-week follow-up appointment with cardiology. Contact information: [address] [place] [address] [phone]        [name], MD Follow up on 10/07/2018.   Specialty: Cardiothoracic Surgery Why: Appointment is at 2:00, please get CXR at 1:30 at [place] located on first floor of our office building Contact information: [place] [address] [phone]           Signed:   Original Note By Jadene Pierini PA-C  Addendum by:  Ellwood Handler PA-C 09/25/2018, 10:07 AM

## 2018-09-23 NOTE — Discharge Instructions (Signed)
Discharge Instructions:  1. You may shower, please wash incisions daily with soap and water and keep dry.  If you wish to cover wounds with dressing you may do so but please keep clean and change daily.  No tub baths or swimming until incisions have completely healed.  If your incisions become red or develop any drainage please call our office at [phone]  2. No Driving until cleared by surgeons office and you are no longer using narcotic pain medications  3. Monitor your weight daily.. Please use the same scale and weigh at same time... If you gain 5-10 lbs in 48 hours with associated lower extremity swelling, please contact our office at [phone]  4. Fever of 101.0 for at least 24 hours with no source, please contact our office at [phone]  5. Activity- up as tolerated, please walk at least 3 times per day.  Avoid strenuous activity, no lifting, pushing, or pulling with your arms over 8-10 lbs for a minimum of 6 weeks  6. If any questions or concerns arise, please do not hesitate to contact our office at [phone]

## 2018-09-24 LAB — GLUCOSE, CAPILLARY
Glucose-Capillary: 102 mg/dL — ABNORMAL HIGH (ref 70–99)
Glucose-Capillary: 107 mg/dL — ABNORMAL HIGH (ref 70–99)
Glucose-Capillary: 99 mg/dL (ref 70–99)
Glucose-Capillary: 90 mg/dL (ref 70–99)
Glucose-Capillary: 93 mg/dL (ref 70–99)

## 2018-09-24 LAB — BPAM RBC
Blood Product Expiration Date: 202009032359
Blood Product Expiration Date: 202009072359
Blood Product Expiration Date: 202009072359
Blood Product Expiration Date: 202009072359
Blood Product Expiration Date: 202009092359
Blood Product Expiration Date: 202009092359
ISSUE DATE / TIME: 202008120727
ISSUE DATE / TIME: 202008120727
ISSUE DATE / TIME: 202008120727
ISSUE DATE / TIME: 202008121922
ISSUE DATE / TIME: 202008130817
Unit Type and Rh: 5100
Unit Type and Rh: 5100
Unit Type and Rh: 5100
Unit Type and Rh: 5100
Unit Type and Rh: 5100
Unit Type and Rh: 5100

## 2018-09-24 LAB — TYPE AND SCREEN
ABO/RH(D): O POS
Antibody Screen: NEGATIVE
Unit division: 0
Unit division: 0
Unit division: 0
Unit division: 0
Unit division: 0
Unit division: 0

## 2018-09-24 LAB — BASIC METABOLIC PANEL
Anion gap: 7 (ref 5–15)
BUN: 11 mg/dL (ref 8–23)
CO2: 28 mmol/L (ref 22–32)
Calcium: 8.2 mg/dL — ABNORMAL LOW (ref 8.9–10.3)
Chloride: 106 mmol/L (ref 98–111)
Creatinine, Ser: 1 mg/dL (ref 0.61–1.24)
GFR calc Af Amer: >60 mL/min (ref >60)
GFR calc non Af Amer: >60 mL/min (ref >60)
Glucose, Bld: 106 mg/dL — ABNORMAL HIGH (ref 70–99)
Potassium: 3.6 mmol/L (ref 3.5–5.1)
Sodium: 141 mmol/L (ref 135–145)

## 2018-09-24 MED ORDER — POTASSIUM CHLORIDE CRYS ER 20 MEQ PO TBCR
20.0000 meq | EXTENDED_RELEASE_TABLET | ORAL | Status: AC
  Administered 2018-09-24 (×3): 20 meq via ORAL
  Filled 2018-09-24 (×3): qty 1

## 2018-09-24 MED ORDER — SODIUM CHLORIDE 0.9% FLUSH: 10.0000 mL | INTRAVENOUS | Status: DC | PRN

## 2018-09-24 MED ORDER — SODIUM CHLORIDE 0.9% FLUSH
10.0000 mL | Freq: Two times a day (BID) | INTRAVENOUS | Status: DC
  Administered 2018-09-24: 10 mL

## 2018-09-24 NOTE — Progress Notes (Signed)
Cardiac rehab 1355 Patient given heart healthy diet information. Reinforced use of incentive spirometer. Will send phase 2 referral to [facility] upon discharge in [place]. Will follow up with the patient on Monday.Barnet Pall, RN,BSN 09/24/2018 1:57 PM

## 2018-09-24 NOTE — Progress Notes (Signed)
3 Days Post-Op Procedure(s) (LRB): REDO STERNOTOMY (N/A) REPAIR OF ASCENDING AORTIC ANEURYSM USING 32 MM HEMASHIELD PLATINUM WOVEN DOUBLE VELOUR VASCULAR GRAFT WITH REATTACHMENT OF PREVIOUS SAPHENOUS VEIN GRAFT  (N/A) OPEN RIGHT SAPHENOUS VEIN HARVEST (N/A) TRANSESOPHAGEAL ECHOCARDIOGRAM (TEE) (N/A) Subjective: No complaints  Objective: Vital signs in last 24 hours: Temp:  [98.1 F (36.7 C)-99.7 F (37.6 C)] 98.1 F (36.7 C) (08/15 0756) Pulse Rate:  [62-89] 83 (08/15 0800) Cardiac Rhythm: Normal sinus rhythm (08/15 0400) Resp:  [16-29] 21 (08/15 0800) BP: (97-151)/(62-79) 110/69 (08/15 0800) SpO2:  [90 %-100 %] 97 % (08/15 0800) Weight:  [85.9 kg] 85.9 kg (08/15 0600)  Hemodynamic parameters for last 24 hours:    Intake/Output from previous day: 08/14 0701 - 08/15 0700 In: 440.3 [P.O.:360; I.V.:80.3] Out: 4675 [Urine:4265; Chest Tube:410] Intake/Output this shift: No intake/output data recorded.  General appearance: alert and cooperative Neurologic: intact Heart: regular rate and rhythm, S1, S2 normal, no murmur, click, rub or gallop Lungs: clear to auscultation bilaterally  Lab Results: Recent Labs    09/22/18 1648 09/23/18 0446  WBC 10.8* 8.6  HGB 8.2* 7.7*  HCT 24.4* 22.8*  PLT 87* 81*   BMET:  Recent Labs    09/23/18 0446 09/24/18 0312  NA 138 141  K 4.0 3.6  CL 106 106  CO2 25 28  GLUCOSE 117* 106*  BUN 12 11  CREATININE 1.00 1.00  CALCIUM 8.1* 8.2*    PT/INR:  Recent Labs    09/21/18 1653  LABPROT 18.7*  INR 1.6*   ABG    Component Value Date/Time   PHART 7.410 09/21/2018 2325   HCO3 21.7 09/21/2018 2325   TCO2 23 09/21/2018 2325   ACIDBASEDEF 3.0 (H) 09/21/2018 2325   O2SAT 95.0 09/21/2018 2325   CBG (last 3)  Recent Labs    09/23/18 2006 09/23/18 2358 09/24/18 0316  GLUCAP 107* 99 102*    Assessment/Plan: S/P Procedure(s) (LRB): REDO STERNOTOMY (N/A) REPAIR OF ASCENDING AORTIC ANEURYSM USING 32 MM HEMASHIELD PLATINUM  WOVEN DOUBLE VELOUR VASCULAR GRAFT WITH REATTACHMENT OF PREVIOUS SAPHENOUS VEIN GRAFT  (N/A) OPEN RIGHT SAPHENOUS VEIN HARVEST (N/A) TRANSESOPHAGEAL ECHOCARDIOGRAM (TEE) (N/A) Plan for transfer to step-down: see transfer orders   LOS: 4 days    [name] 09/24/2018

## 2018-09-25 ENCOUNTER — Inpatient Hospital Stay (HOSPITAL_COMMUNITY): Payer: BC Managed Care – PPO

## 2018-09-25 MED ORDER — TRAMADOL HCL 50 MG PO TABS: 50.0000 mg | ORAL_TABLET | ORAL | 0 refills | Status: DC | PRN

## 2018-09-25 MED ORDER — FUROSEMIDE 40 MG PO TABS: 40.0000 mg | ORAL_TABLET | Freq: Every day | ORAL | 0 refills | Status: DC

## 2018-09-25 MED ORDER — ACETAMINOPHEN 500 MG PO TABS: 1000.0000 mg | ORAL_TABLET | Freq: Four times a day (QID) | ORAL | 0 refills | Status: DC | PRN

## 2018-09-25 MED ORDER — POTASSIUM CHLORIDE ER 10 MEQ PO TBCR: 10.0000 meq | EXTENDED_RELEASE_TABLET | Freq: Every day | ORAL | 0 refills | Status: DC

## 2018-09-25 MED ORDER — ADULT MULTIVITAMIN W/MINERALS CH
1.0000 | ORAL_TABLET | Freq: Every day | ORAL | Status: DC
  Administered 2018-09-25: 1 via ORAL
  Filled 2018-09-25: qty 1

## 2018-09-25 NOTE — Progress Notes (Signed)
4 Days Post-Op Procedure(s) (LRB): REDO STERNOTOMY (N/A) REPAIR OF ASCENDING AORTIC ANEURYSM USING 32 MM HEMASHIELD PLATINUM WOVEN DOUBLE VELOUR VASCULAR GRAFT WITH REATTACHMENT OF PREVIOUS SAPHENOUS VEIN GRAFT  (N/A) OPEN RIGHT SAPHENOUS VEIN HARVEST (N/A) TRANSESOPHAGEAL ECHOCARDIOGRAM (TEE) (N/A) Subjective: No complaints  Objective: Vital signs in last 24 hours: Temp:  [98.3 F (36.8 C)-99.6 F (37.6 C)] 98.3 F (36.8 C) (08/16 0805) Pulse Rate:  [64-92] 85 (08/16 0900) Cardiac Rhythm: Normal sinus rhythm (08/16 0800) BP: (103-133)/(62-82) 103/62 (08/16 0900) SpO2:  [90 %-100 %] 94 % (08/16 0900) Weight:  [86 kg] 86 kg (08/16 0500)  Hemodynamic parameters for last 24 hours:    Intake/Output from previous day: 08/15 0701 - 08/16 0700 In: 720 [P.O.:720] Out: 2150 [Urine:2150] Intake/Output this shift: No intake/output data recorded.  General appearance: alert, cooperative and no distress Neurologic: intact Heart: regular rate and rhythm, S1, S2 normal, no murmur, click, rub or gallop Lungs: clear to auscultation bilaterally Extremities: extremities normal, atraumatic, no cyanosis or edema Wound: sternal and right groin incision without drainage  Lab Results: Recent Labs    09/22/18 1648 09/23/18 0446  WBC 10.8* 8.6  HGB 8.2* 7.7*  HCT 24.4* 22.8*  PLT 87* 81*   BMET:  Recent Labs    09/23/18 0446 09/24/18 0312  NA 138 141  K 4.0 3.6  CL 106 106  CO2 25 28  GLUCOSE 117* 106*  BUN 12 11  CREATININE 1.00 1.00  CALCIUM 8.1* 8.2*    PT/INR: No results for input(s): LABPROT, INR in the last 72 hours. ABG    Component Value Date/Time   PHART 7.410 09/21/2018 2325   HCO3 21.7 09/21/2018 2325   TCO2 23 09/21/2018 2325   ACIDBASEDEF 3.0 (H) 09/21/2018 2325   O2SAT 95.0 09/21/2018 2325   CBG (last 3)  Recent Labs    09/24/18 0316 09/24/18 1151 09/24/18 1557  GLUCAP 102* 90 93    Assessment/Plan: S/P Procedure(s) (LRB): REDO STERNOTOMY  (N/A) REPAIR OF ASCENDING AORTIC ANEURYSM USING 32 MM HEMASHIELD PLATINUM WOVEN DOUBLE VELOUR VASCULAR GRAFT WITH REATTACHMENT OF PREVIOUS SAPHENOUS VEIN GRAFT  (N/A) OPEN RIGHT SAPHENOUS VEIN HARVEST (N/A) TRANSESOPHAGEAL ECHOCARDIOGRAM (TEE) (N/A) Plan for discharge: see discharge orders  F/u one week   LOS: 5 days    [name] 09/25/2018

## 2018-09-25 NOTE — Progress Notes (Signed)
DC orders received.  Patient stable with no S/S of distress.  Medication and discharge instructions reviewed with patient and patient's son.  Chest tube sutures removed her order.  Patient DC home. [place], Ardeth Sportsman

## 2018-09-25 NOTE — Plan of Care (Signed)
  Problem: Education: Goal: Knowledge of General Education information will improve Description: Including pain rating scale, medication(s)/side effects and non-pharmacologic comfort measures Outcome: Adequate for Discharge   Problem: Health Behavior/Discharge Planning: Goal: Ability to manage health-related needs will improve Outcome: Adequate for Discharge   Problem: Clinical Measurements: Goal: Ability to maintain clinical measurements within normal limits will improve Outcome: Adequate for Discharge Goal: Will remain free from infection Outcome: Adequate for Discharge Goal: Diagnostic test results will improve Outcome: Adequate for Discharge Goal: Respiratory complications will improve Outcome: Adequate for Discharge Goal: Cardiovascular complication will be avoided Outcome: Adequate for Discharge   Problem: Activity: Goal: Risk for activity intolerance will decrease Outcome: Adequate for Discharge   Problem: Nutrition: Goal: Adequate nutrition will be maintained Outcome: Adequate for Discharge   Problem: Coping: Goal: Level of anxiety will decrease Outcome: Adequate for Discharge   Problem: Elimination: Goal: Will not experience complications related to bowel motility Outcome: Adequate for Discharge Goal: Will not experience complications related to urinary retention Outcome: Adequate for Discharge   Problem: Pain Managment: Goal: General experience of comfort will improve Outcome: Adequate for Discharge   Problem: Safety: Goal: Ability to remain free from injury will improve Outcome: Adequate for Discharge   Problem: Skin Integrity: Goal: Risk for impaired skin integrity will decrease Outcome: Adequate for Discharge   Problem: Education: Goal: Knowledge of the prescribed therapeutic regimen will improve Outcome: Adequate for Discharge   Problem: Bowel/Gastric: Goal: Gastrointestinal status for postoperative course will improve Outcome: Adequate for  Discharge   Problem: Clinical Measurements: Goal: Postoperative complications will be avoided or minimized Outcome: Adequate for Discharge   Problem: Respiratory: Goal: Respiratory status will improve Outcome: Adequate for Discharge   Problem: Skin Integrity: Goal: Demonstration of wound healing without infection will improve Outcome: Adequate for Discharge   Problem: Urinary Elimination: Goal: Ability to achieve and maintain adequate renal perfusion and functioning will improve Outcome: Adequate for Discharge

## 2018-09-28 ENCOUNTER — Telehealth: Payer: Self-pay

## 2018-09-28 ENCOUNTER — Other Ambulatory Visit: Payer: Self-pay

## 2018-09-28 MED ORDER — METOPROLOL TARTRATE 25 MG PO TABS: 12.5000 mg | ORAL_TABLET | Freq: Two times a day (BID) | ORAL | 0 refills | Status: AC

## 2018-09-28 MED ORDER — ATORVASTATIN CALCIUM 40 MG PO TABS: 40.0000 mg | ORAL_TABLET | Freq: Every day | ORAL | 0 refills | Status: DC

## 2018-09-28 MED ORDER — LEVOTHYROXINE SODIUM 75 MCG PO TABS: 75.0000 ug | ORAL_TABLET | Freq: Every day | ORAL | 0 refills | Status: DC

## 2018-09-28 MED ORDER — METOPROLOL TARTRATE 25 MG PO TABS: 12.5000 mg | ORAL_TABLET | Freq: Two times a day (BID) | ORAL | 0 refills | Status: DC

## 2018-09-28 NOTE — Telephone Encounter (Signed)
-----   Message from [name], MD sent at 09/28/2018 11:17 AM EDT ----- Regarding: RE: Medication refills Thank you for doing that! ----- Message ----- From: Donnella Sham, RN Sent: 09/28/2018  10:18 AM EDT To: [name], MD Subject: Medication refills                             Hey,  Patient called and stated that he did not have a couple of prescriptions since he was not able to go back home and was not prepared to stay near [place].  I went ahead and refilled the metoprolol, levothyroxine, and atorvastatin for 14 days until he could get back home.  Just wanted to make you aware.  Thanks,  Caryl Pina

## 2018-10-03 ENCOUNTER — Encounter: Payer: Self-pay | Admitting: *Deleted

## 2018-10-03 NOTE — Telephone Encounter (Signed)
This encounter was created in error - please disregard.

## 2018-10-04 ENCOUNTER — Telehealth: Payer: Self-pay

## 2018-10-04 NOTE — Telephone Encounter (Signed)
Opened in error

## 2018-10-06 ENCOUNTER — Other Ambulatory Visit: Payer: Self-pay | Admitting: Cardiothoracic Surgery

## 2018-10-06 DIAGNOSIS — Z952 Presence of prosthetic heart valve: Secondary | ICD-10-CM

## 2018-10-07 ENCOUNTER — Ambulatory Visit
Admission: RE | Admit: 2018-10-07 | Discharge: 2018-10-07 | Disposition: A | Discharge status: Home / Self Care | Payer: BC Managed Care – PPO | Source: Ambulatory Visit | Attending: Cardiothoracic Surgery | Admitting: Cardiothoracic Surgery

## 2018-10-07 ENCOUNTER — Ambulatory Visit (INDEPENDENT_AMBULATORY_CARE_PROVIDER_SITE_OTHER): Payer: Self-pay | Admitting: Cardiothoracic Surgery

## 2018-10-07 ENCOUNTER — Other Ambulatory Visit: Payer: Self-pay

## 2018-10-07 ENCOUNTER — Encounter: Payer: Self-pay | Admitting: Cardiothoracic Surgery

## 2018-10-07 VITALS — BP 110/73 | HR 82 | Temp 97.3°F | Resp 18 | Ht 70.0 in | Wt 189.8 lb

## 2018-10-07 DIAGNOSIS — I71019 Dissection of thoracic aorta, unspecified: Secondary | ICD-10-CM

## 2018-10-07 DIAGNOSIS — I7101 Dissection of thoracic aorta: Secondary | ICD-10-CM

## 2018-10-07 DIAGNOSIS — Z09 Encounter for follow-up examination after completed treatment for conditions other than malignant neoplasm: Secondary | ICD-10-CM

## 2018-10-07 DIAGNOSIS — Z952 Presence of prosthetic heart valve: Secondary | ICD-10-CM

## 2018-10-07 NOTE — Progress Notes (Signed)
[address]       [place] [address]             [phone]     CARDIOTHORACIC SURGERY OFFICE NOTE  Referring Provider is Stanford Breed, Denice Bors, MD Primary Cardiologist is [name], MD PCP is Rusty Aus, MD   HPI:  68 year old gentleman returns for initial follow-up examination after urgent repair of ascending aorta.  He did remarkably well in the hospital and was discharged within 5 days.  On follow-up now he reports no difficulty with pain or shortness of breath   Current Outpatient Medications  Medication Sig Dispense Refill  . acetaminophen (TYLENOL) 500 MG tablet Take 2 tablets (1,000 mg total) by mouth every 6 (six) hours as needed. 30 tablet 0  . aspirin EC 81 MG tablet Take 81 mg by mouth daily.    [name] atorvastatin (LIPITOR) 40 MG tablet Take 1 tablet (40 mg total) by mouth at bedtime. 14 tablet 0  . diclofenac (VOLTAREN) 75 MG EC tablet Take 1 tablet (75 mg total) by mouth 2 (two) times daily as needed. (Patient taking differently: Take 75 mg by mouth daily as needed for mild pain or moderate pain. ) 60 tablet 2  . levothyroxine (SYNTHROID) 75 MCG tablet Take 1 tablet (75 mcg total) by mouth daily before breakfast. 14 tablet 0  . metoprolol tartrate (LOPRESSOR) 25 MG tablet Take 0.5 tablets (12.5 mg total) by mouth 2 (two) times daily. 14 tablet 0  . Multiple Vitamin (MULTIVITAMIN WITH MINERALS) TABS tablet Take 1 tablet by mouth daily with breakfast. ONE-A-DAY MEN'S 50+    . senna-docusate (SENOKOT-S) 8.6-50 MG tablet Take 1 tablet by mouth 2 (two) times daily. While taking strong pain meds to prevent constipation (Patient taking differently: Take 1 tablet by mouth daily as needed for mild constipation or moderate constipation. While taking strong pain meds to prevent constipation) 20 tablet 0  . traMADol (ULTRAM) 50 MG tablet Take 1-2 tablets (50-100 mg total) by mouth every 4 (four) hours as needed for moderate pain. 30 tablet 0  . zinc  gluconate 50 MG tablet Take 50 mg by mouth daily with breakfast.      No current facility-administered medications for this visit.       Physical Exam:   BP 110/73 (BP Location: Right Arm, Patient Position: Sitting, Cuff Size: Normal)   Pulse 82   Temp (!) 97.3 F (36.3 C)   Resp 18   Ht 5\' 10"  (1.778 m)   Wt 86.1 kg   SpO2 93% Comment: RA  BMI 27.23 kg/m   General:  Well-appearing, no acute distress  Chest:   clear to auscultation  CV:   Regular rhythm  Incisions:  Well-healed  Abdomen:  Soft nontender  Extremities:  Small seroma right groin  Diagnostic Tests:  Chest x-ray with clear lung fields   Impression:  Doing well after replacement of aorta.  Plan:  Follow-up as needed.  He is cleared to return to [place] to arrange his affairs including his job and selling his home.  Ideally, we will follow him annually for repeat CT scans.  This will be arranged once he is moved back to [place] from [place].  I spent in excess of 15 minutes during the conduct of this office consultation and >50% of this time involved direct face-to-face encounter with the patient for counseling and/or coordination of their care.  Level 2  10 minutes Level 3                 15 minutes Level 4                 25 minutes Level 5                 40 minutes  B. Murvin Natal, MD 10/07/2018 1:58 PM

## 2018-10-12 ENCOUNTER — Ambulatory Visit: Payer: BC Managed Care – PPO | Admitting: Cardiology

## 2018-10-18 ENCOUNTER — Telehealth: Payer: Self-pay

## 2018-10-18 ENCOUNTER — Telehealth (INDEPENDENT_AMBULATORY_CARE_PROVIDER_SITE_OTHER): Payer: BC Managed Care – PPO | Admitting: Cardiology

## 2018-10-18 ENCOUNTER — Encounter: Payer: Self-pay | Admitting: Cardiology

## 2018-10-18 VITALS — HR 68 | Ht 70.0 in | Wt 190.0 lb

## 2018-10-18 DIAGNOSIS — Z951 Presence of aortocoronary bypass graft: Secondary | ICD-10-CM | POA: Insufficient documentation

## 2018-10-18 DIAGNOSIS — I7101 Dissection of thoracic aorta: Secondary | ICD-10-CM

## 2018-10-18 DIAGNOSIS — C61 Malignant neoplasm of prostate: Secondary | ICD-10-CM

## 2018-10-18 DIAGNOSIS — I71019 Dissection of thoracic aorta, unspecified: Secondary | ICD-10-CM

## 2018-10-18 DIAGNOSIS — I10 Essential (primary) hypertension: Secondary | ICD-10-CM

## 2018-10-18 DIAGNOSIS — Z952 Presence of prosthetic heart valve: Secondary | ICD-10-CM

## 2018-10-18 NOTE — Progress Notes (Signed)
Virtual Visit via Video Note   This visit type was conducted due to national recommendations for restrictions regarding the COVID-19 Pandemic (e.g. social distancing) in an effort to limit this patient's exposure and mitigate transmission in our community.  Due to his co-morbid illnesses, this patient is at least at moderate risk for complications without adequate follow up.  This format is felt to be most appropriate for this patient at this time.  All issues noted in this document were discussed and addressed.  A limited physical exam was performed with this format.  Please refer to the patient's chart for his consent to telehealth for [facility].   Date:  10/18/2018   ID:  Danny Odom, DOB [birth year], MRN [id number]  Patient Location: Home Provider Location: Home  PCP:  Rusty Aus, MD  Cardiologist:  [name], MD  Electrophysiologist:  None   Evaluation Performed:  Follow-Up Visit  Chief Complaint:  none  History of Present Illness:    Danny Odom is a pleasant 68 y.o. male who currently lives in [place].  He plans to move to [facility] soon and has his medical care here.  He has been followed by Dr. Stanford Breed with a history of bicuspid aortic valve and aortic stenosis.  In August 2018 he underwent CABG x2 and tissue AVR.  He had an LIMA to the DX 1 and an SVG to the OM.  He had normal LV function by echo.  He was also noted to have a dilated aortic root at 3.8 -3.9 cm.  He does have a history of prostate cancer followed by DR Danny Odom.  He had surgery in 2015 and in July 2020 had orchiectomy.    In 09/20/2018 he had a surveillance CT scan to f/u his AO root dilatation. The CT showed dissection of his a sending aorta.  He was sent to the hospital and on 09/21/2018 he underwent urgent repair by Dr. Orvan Seen. The patient was contacted today for follow up.   The patient says he is done well since his surgery.  He has been released by Dr Orvan Seen and he is back in [place].  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  He tells me his energy is even better than it was before his surgery. He denies any unusual chest pain, shortness of breath, or tachycardia.  He is having no issues with his medications.  He was significantly anemic at discharge in August and I suggested it may be prudent to check a CBC.  The patient does not have symptoms concerning for COVID-19 infection (fever, chills, cough, or new shortness of breath).    Past Medical History:  Diagnosis Date  . Aortic stenosis 2014   08-2017  severe;   s/p  avr   . Arthritis    back and joints  . Cardiac murmur   . Coronary artery disease cardiologist-- dr Stanford Breed   10-01-2017  s/p  cabg x2  . ED (erectile dysfunction)   . Hyperlipidemia   . Hypertension   . Hypothyroid    followed by pcp  . Nocturia   . Primary prostate cancer with metastasis from prostate to other site [facility]) urologist-- dr Danny Odom   first dx 08-01-2013;  Stage T3a, gleason 3+4;  s/p  radical prostatectomy w/ node dissection 10-20-2013  and completed post surgery IMRT  04-19-2014  . S/P aortic valve replacement 10/01/2017   edwards pericardial valve  . S/P CABG x 2  10/01/2017   LIMA to Diagonal;   SVG to OM  . S/P radiation therapy 03/05/2014 through 04/19/2014   Prostate bed 6600 cGy in 33 sessions, pelvic lymph nodes 5000 445 cGy in 33 sessions   . Wears glasses    Past Surgical History:  Procedure Laterality Date  . AORTIC VALVE REPLACEMENT N/A 10/01/2017   Procedure: AORTIC VALVE REPLACEMENT (AVR);  Surgeon: Gaye Pollack, MD;  Location: [place];  Service: Open Heart Surgery;  Laterality: N/A;  . ASCENDING AORTIC ROOT REPLACEMENT N/A 09/21/2018   Procedure: REPAIR OF ASCENDING AORTIC ANEURYSM USING 32 MM HEMASHIELD PLATINUM WOVEN DOUBLE VELOUR VASCULAR GRAFT WITH REATTACHMENT OF PREVIOUS SAPHENOUS VEIN GRAFT ;  Surgeon: [name], MD;  Location: [place];  Service: Open Heart  Surgery;  Laterality: N/A;  . COLONOSCOPY    . CORONARY ARTERY BYPASS GRAFT N/A 10/01/2017   Procedure: CORONARY ARTERY BYPASS GRAFTING (CABG) x two, using left internal mammary artery and right leg greater saphenous vein harvested endoscopically;  Surgeon: Gaye Pollack, MD;  Location: [place] [facility];  Service: Open Heart Surgery;  Laterality: N/A;  . CORONARY ARTERY BYPASS GRAFT N/A 09/21/2018   Procedure: OPEN RIGHT SAPHENOUS VEIN HARVEST;  Surgeon: [name], MD;  Location: [place];  Service: Open Heart Surgery;  Laterality: N/A;  . LYMPHADENECTOMY Bilateral 10/20/2013   Procedure: BILATERAL PELVIC LYMPH NODE DISSECTION;  Surgeon: Alexis Frock, MD;  [facility];  Service: Urology;  Laterality: Bilateral;  . ORCHIECTOMY Bilateral 08/26/2018   Procedure: ORCHIECTOMY;  Surgeon: Alexis Frock, MD;  Location: [facility];  Service: Urology;  Laterality: Bilateral;  . RIGHT/LEFT HEART CATH AND CORONARY ANGIOGRAPHY N/A 08/23/2017   Procedure: RIGHT/LEFT HEART CATH AND CORONARY ANGIOGRAPHY;  Surgeon: Leonie Man, MD;  Location: [place] [facility];  Service: Cardiovascular;  Laterality: N/A;  . ROBOT ASSISTED LAPAROSCOPIC RADICAL PROSTATECTOMY N/A 10/20/2013   Procedure: ROBOTIC ASSISTED LAPAROSCOPIC RADICAL PROSTATECTOMY WITH INDOCYANINE GREEN DYE;  Surgeon: Alexis Frock, MD;  [facility];  Service: Urology;  Laterality: N/A;  . TEE WITHOUT CARDIOVERSION N/A 10/01/2017   Procedure: TRANSESOPHAGEAL ECHOCARDIOGRAM (TEE);  Surgeon: Gaye Pollack, MD;  Location: [place];  Service: Open Heart Surgery;  Laterality: N/A;  . TEE WITHOUT CARDIOVERSION N/A 09/21/2018   Procedure: TRANSESOPHAGEAL ECHOCARDIOGRAM (TEE);  Surgeon: [name], MD;  Location: [place];  Service: Open Heart Surgery;  Laterality: N/A;  . TONSILLECTOMY  child  . varicocele surgery  Right 1983 aaprx     Current Meds  Medication Sig  . aspirin EC 81 MG tablet Take 81 mg by mouth daily.  [name] atorvastatin  (LIPITOR) 40 MG tablet Take 1 tablet (40 mg total) by mouth at bedtime.  . diclofenac (VOLTAREN) 75 MG EC tablet Take 75 mg by mouth daily as needed.  [name] levothyroxine (SYNTHROID) 75 MCG tablet Take 1 tablet (75 mcg total) by mouth daily before breakfast.  . metoprolol tartrate (LOPRESSOR) 25 MG tablet Take 0.5 tablets (12.5 mg total) by mouth 2 (two) times daily.  . Multiple Vitamin (MULTIVITAMIN WITH MINERALS) TABS tablet Take 1 tablet by mouth daily with breakfast. ONE-A-DAY MEN'S 50+  . sennosides-docusate sodium (SENOKOT-S) 8.6-50 MG tablet Take 1 tablet by mouth daily as needed for constipation.  [name] zinc gluconate 50 MG tablet Take 50 mg by mouth at bedtime.      Allergies:   Patient has no known allergies.   Social History   Tobacco Use  . Smoking status: Former Smoker  Packs/day: 1.00    Years: 20.00    Pack years: 20.00    Types: Cigarettes    Quit date: 02/15/1983    Years since quitting: 35.6  . Smokeless tobacco: Never Used  Substance Use Topics  . Alcohol use: Not Currently    Alcohol/week: 0.0 standard drinks    Comment: rarely   . Drug use: Never     Family Hx: The patient's family history includes Breast cancer in his mother; Emphysema in his father; Prostate cancer in his father.  ROS:   Please see the history of present illness.    Intermittent constipation All other systems reviewed and are negative.   Prior CV studies:   The following studies were reviewed today:  CT scan 09/20/2018  Labs/Other Tests and Data Reviewed:    EKG:  No EKG reviewed  Recent Labs: 02/01/2018: ALT 12 09/22/2018: Magnesium 2.3 09/23/2018: Hemoglobin 7.7; Platelets 81 09/24/2018: BUN 11; Creatinine, Ser 1.00; Potassium 3.6; Sodium 141   Recent Lipid Panel Lab Results  Component Value Date/Time   CHOL 141 02/01/2018 09:40 AM   TRIG 115 02/01/2018 09:40 AM   HDL 53 02/01/2018 09:40 AM   CHOLHDL 2.7 02/01/2018 09:40 AM   LDLCALC 65 02/01/2018 09:40 AM    Wt Readings  from Last 3 Encounters:  10/18/18 190 lb (86.2 kg)  10/07/18 189 lb 12.8 oz (86.1 kg)  09/25/18 189 lb 9.5 oz (86 kg)     Objective:    Vital Signs:  Pulse 68   Ht 5\' 10"  (1.778 m)   Wt 190 lb (86.2 kg)   BMI 27.26 kg/m    VITAL SIGNS:  reviewed  ASSESSMENT & PLAN:    S/P urgent ascending aortic aneurysm repair- Urgent surgery 09/21/2018 after surveillance CT showed dissection.  H/O CABG- CABG x 2 and tissue AVR Aug 2019  Bicuspid AOV with AS- S/p tissue AVR, normal LVF  Prostate Cancer with mets- Followed by Dr Manny-prostatectomy with lymphadenectomy (single positive node) Sept 2015, orchiectomy July 2020   COVID-19 Education: The signs and symptoms of COVID-19 were discussed with the patient and how to seek care for testing (follow up with PCP or arrange E-visit).  The importance of social distancing was discussed today.  Time:   Today, I have spent 20 minutes with the patient with telehealth technology discussing the above problems.     Medication Adjustments/Labs and Tests Ordered: Current medicines are reviewed at length with the patient today.  Concerns regarding medicines are outlined above.   Tests Ordered: No orders of the defined types were placed in this encounter.   Medication Changes: No orders of the defined types were placed in this encounter.   Follow Up:  In Person Keep appointment with Dr Stanford Breed as scheduled  Signed, Kerin Ransom, PA-C  10/18/2018 9:15 AM    [place]

## 2018-10-18 NOTE — Addendum Note (Signed)
Addended by: Ulice Brilliant T on: 10/18/2018 11:07 AM   Modules accepted: Orders

## 2018-10-18 NOTE — Telephone Encounter (Signed)
Contacted patient to discuss AVS Instructions. Gave patient Danny Odom's recommendations from today's virtual office visit. Informed patient that I would mail him his lab slip he requested I put our fax number on the lab order. Advised patient I would do that. Patient voiced understanding and AVS mailed.

## 2018-10-18 NOTE — Patient Instructions (Addendum)
Medication Instructions:  Your physician recommends that you continue on your current medications as directed. Please refer to the Current Medication list given to you today. If you need a refill on your cardiac medications before your next appointment, please call your pharmacy.   Lab work: Your physician recommends that you return for lab work in: CBC in the next week or two If you have labs (blood work) drawn today and your tests are completely normal, you will receive your results only by: [name] MyChart Message (if you have MyChart) OR . A paper copy in the mail If you have any lab test that is abnormal or we need to change your treatment, we will call you to review the results.  Testing/Procedures: None   Follow-Up: At [facility], you and your health needs are our priority.  As part of our continuing mission to provide you with exceptional heart care, we have created designated Provider Care Teams.  These Care Teams include your primary Cardiologist (physician) and Advanced Practice Providers (APPs -  Physician Assistants and Nurse Practitioners) who all work together to provide you with the care you need, when you need it. [name] LUKE RECOMMENDS YOU FOLLOW UP AS SCHEDULED WITH DR CRENSHAW.  Any Other Special Instructions Will Be Listed Below (If Applicable).

## 2018-10-26 LAB — CBC WITH DIFFERENTIAL/PLATELET
Basophils Absolute: 0 10*3/uL (ref 0.0–0.2)
Basos: 0 %
EOS (ABSOLUTE): 0.1 10*3/uL (ref 0.0–0.4)
Eos: 2 %
Hematocrit: 32.9 % — ABNORMAL LOW (ref 37.5–51.0)
Hemoglobin: 10.4 g/dL — ABNORMAL LOW (ref 13.0–17.7)
Immature Grans (Abs): 0 10*3/uL (ref 0.0–0.1)
Immature Granulocytes: 0 %
Lymphocytes Absolute: 0.9 10*3/uL (ref 0.7–3.1)
Lymphs: 14 %
MCH: 29.2 pg (ref 26.6–33.0)
MCHC: 31.6 g/dL (ref 31.5–35.7)
MCV: 92 fL (ref 79–97)
Monocytes Absolute: 0.9 10*3/uL (ref 0.1–0.9)
Monocytes: 13 %
Neutrophils Absolute: 4.8 10*3/uL (ref 1.4–7.0)
Neutrophils: 71 %
Platelets: 284 10*3/uL (ref 150–450)
RBC: 3.56 x10E6/uL — ABNORMAL LOW (ref 4.14–5.80)
RDW: 13.7 % (ref 11.6–15.4)
WBC: 6.7 10*3/uL (ref 3.4–10.8)

## 2018-12-19 ENCOUNTER — Encounter: Payer: Self-pay | Admitting: *Deleted

## 2018-12-22 NOTE — Progress Notes (Signed)
Virtual Visit via Video Note   This visit type was conducted due to national recommendations for restrictions regarding the COVID-19 Pandemic (e.g. social distancing) in an effort to limit this patient's exposure and mitigate transmission in our community.  Due to his co-morbid illnesses, this patient is at least at moderate risk for complications without adequate follow up.  This format is felt to be most appropriate for this patient at this time.  All issues noted in this document were discussed and addressed.  A limited physical exam was performed with this format.  Please refer to the patient's chart for his consent to telehealth for [facility].   Date:  12/27/2018   ID:  Danny Odom, DOB May 08, 1950, MRN [id number]  Patient Location:Home Provider Location: Home  PCP:  Rusty Aus, MD  Cardiologist:  Dr Stanford Breed  Evaluation Performed:  Follow-Up Visit  Chief Complaint:  FU CAD and AVR  History of Present Illness:    FU AVR and CAD. Echocardiogram repeated July 2019and showed normal LV function, functionally bicuspid aortic valve with moderate aortic stenosis (mean gradient 30 mmHg), moderate aortic insufficiency,mild mitral regurgitation.CTA July 2019 showed 3.8 to 3.9 cm aortic root. Cardiac catheterization July 2019 showed normal LV function, left ventricular end-diastolic pressure 23 mmHg, moderate aortic stenosis, 90% first diagonal and 75% circumflex. Preoperative carotid Dopplers August 2019 showed no significant stenosis. Patient had aortic valve replacement with pericardial valve as well as coronary artery bypass and graft with a LIMA to the diagonal and saphenous vein graft to the obtuse marginal in August 2019. Echo 8/20 showed normal LV function; prior AVR with norma function and no AI. CTA 8/20 showed ascending aortic aneurysm of 5 cm and ascending dissection. Pt subsequently had ascending aortic root replacement 09/21/18.  Since last seen, patient denies  dyspnea, chest pain, palpitations or syncope.  The patient does not have symptoms concerning for COVID-19 infection (fever, chills, cough, or new shortness of breath).    Past Medical History:  Diagnosis Date   Aortic stenosis 2014   08-2017  severe;   s/p  avr    Arthritis    back and joints   Cardiac murmur    Coronary artery disease cardiologist-- dr Stanford Breed   10-01-2017  s/p  cabg x2   ED (erectile dysfunction)    Hyperlipidemia    Hypertension    Hypothyroid    followed by pcp   Nocturia    Primary prostate cancer with metastasis from prostate to other site [facility]) urologist-- dr Tresa Moore   first dx 08-01-2013;  Stage T3a, gleason 3+4;  s/p  radical prostatectomy w/ node dissection 10-20-2013  and completed post surgery IMRT  04-19-2014   S/P aortic valve replacement 10/01/2017   edwards pericardial valve   S/P CABG x 2 10/01/2017   LIMA to Diagonal;   SVG to OM   S/P radiation therapy 03/05/2014 through 04/19/2014   Prostate bed 6600 cGy in 33 sessions, pelvic lymph nodes 5000 445 cGy in 33 sessions    Wears glasses    Past Surgical History:  Procedure Laterality Date   AORTIC VALVE REPLACEMENT N/A 10/01/2017   Procedure: AORTIC VALVE REPLACEMENT (AVR);  Surgeon: Gaye Pollack, MD;  Location: [place];  Service: Open Heart Surgery;  Laterality: N/A;   ASCENDING AORTIC ROOT REPLACEMENT N/A 09/21/2018   Procedure: REPAIR OF ASCENDING AORTIC ANEURYSM USING 32 MM HEMASHIELD PLATINUM WOVEN DOUBLE VELOUR VASCULAR GRAFT WITH REATTACHMENT OF PREVIOUS SAPHENOUS VEIN GRAFT ;  Surgeon: Orvan Seen,  Glenice Bow, MD;  Location: [place];  Service: Open Heart Surgery;  Laterality: N/A;   COLONOSCOPY     CORONARY ARTERY BYPASS GRAFT N/A 10/01/2017   Procedure: CORONARY ARTERY BYPASS GRAFTING (CABG) x two, using left internal mammary artery and right leg greater saphenous vein harvested endoscopically;  Surgeon: Gaye Pollack, MD;  Location: [place] [facility];  Service: Open  Heart Surgery;  Laterality: N/A;   CORONARY ARTERY BYPASS GRAFT N/A 09/21/2018   Procedure: OPEN RIGHT SAPHENOUS VEIN HARVEST;  Surgeon: [name], MD;  Location: [place];  Service: Open Heart Surgery;  Laterality: N/A;   LYMPHADENECTOMY Bilateral 10/20/2013   Procedure: BILATERAL PELVIC LYMPH NODE DISSECTION;  Surgeon: Alexis Frock, MD;  [facility];  Service: Urology;  Laterality: Bilateral;   ORCHIECTOMY Bilateral 08/26/2018   Procedure: ORCHIECTOMY;  Surgeon: Alexis Frock, MD;  [facility];  Service: Urology;  Laterality: Bilateral;   RIGHT/LEFT HEART CATH AND CORONARY ANGIOGRAPHY N/A 08/23/2017   Procedure: RIGHT/LEFT HEART CATH AND CORONARY ANGIOGRAPHY;  Surgeon: Leonie Man, MD;  Location: [place] [facility];  Service: Cardiovascular;  Laterality: N/A;   ROBOT ASSISTED LAPAROSCOPIC RADICAL PROSTATECTOMY N/A 10/20/2013   Procedure: ROBOTIC ASSISTED LAPAROSCOPIC RADICAL PROSTATECTOMY WITH INDOCYANINE GREEN DYE;  Surgeon: Alexis Frock, MD;  [facility];  Service: Urology;  Laterality: N/A;   TEE WITHOUT CARDIOVERSION N/A 10/01/2017   Procedure: TRANSESOPHAGEAL ECHOCARDIOGRAM (TEE);  Surgeon: Gaye Pollack, MD;  Location: [place];  Service: Open Heart Surgery;  Laterality: N/A;   TEE WITHOUT CARDIOVERSION N/A 09/21/2018   Procedure: TRANSESOPHAGEAL ECHOCARDIOGRAM (TEE);  Surgeon: [name], MD;  Location: [place];  Service: Open Heart Surgery;  Laterality: N/A;   TONSILLECTOMY  child   varicocele surgery  Right 1983 aaprx     Current Meds  Medication Sig   aspirin EC 81 MG tablet Take 81 mg by mouth daily.   atorvastatin (LIPITOR) 40 MG tablet Take 1 tablet (40 mg total) by mouth at bedtime.   levothyroxine (SYNTHROID) 75 MCG tablet Take 1 tablet (75 mcg total) by mouth daily before breakfast.   metoprolol tartrate (LOPRESSOR) 25 MG tablet Take 0.5 tablets (12.5 mg total) by mouth 2 (two) times daily.   Multiple Vitamin  (MULTIVITAMIN WITH MINERALS) TABS tablet Take 1 tablet by mouth daily with breakfast. ONE-A-DAY MEN'S 50+   Psyllium (METAMUCIL PO) Take by mouth daily.   traMADol (ULTRAM) 50 MG tablet Take 1-2 tablets (50-100 mg total) by mouth every 4 (four) hours as needed for moderate pain.   zinc gluconate 50 MG tablet Take 50 mg by mouth at bedtime.    [DISCONTINUED] sennosides-docusate sodium (SENOKOT-S) 8.6-50 MG tablet Take 1 tablet by mouth daily as needed for constipation.     Allergies:   Patient has no known allergies.   Social History   Tobacco Use   Smoking status: Former Smoker    Packs/day: 1.00    Years: 20.00    Pack years: 20.00    Types: Cigarettes    Quit date: 02/15/1983    Years since quitting: 35.8   Smokeless tobacco: Never Used  Substance Use Topics   Alcohol use: Not Currently    Alcohol/week: 0.0 standard drinks    Comment: rarely    Drug use: Never     Family Hx: The patient's family history includes Breast cancer in his mother; Emphysema in his father; Prostate cancer in his father.  ROS:   Please see the history of present  illness.    No Fever, chills  or productive cough All other systems reviewed and are negative.   Recent Labs: 02/01/2018: ALT 12 09/22/2018: Magnesium 2.3 09/24/2018: BUN 11; Creatinine, Ser 1.00; Potassium 3.6; Sodium 141 10/25/2018: Hemoglobin 10.4; Platelets 284   Recent Lipid Panel Lab Results  Component Value Date/Time   CHOL 141 02/01/2018 09:40 AM   TRIG 115 02/01/2018 09:40 AM   HDL 53 02/01/2018 09:40 AM   CHOLHDL 2.7 02/01/2018 09:40 AM   LDLCALC 65 02/01/2018 09:40 AM    Wt Readings from Last 3 Encounters:  12/27/18 190 lb (86.2 kg)  10/18/18 190 lb (86.2 kg)  10/07/18 189 lb 12.8 oz (86.1 kg)     Objective:    Vital Signs:  Ht 5\' 10"  (1.778 m)    Wt 190 lb (86.2 kg)    BMI 27.26 kg/m    VITAL SIGNS:  reviewed NAD Answers questions appropriately Normal affect Remainder of physical examination not  performed (telehealth visit; coronavirus pandemic)  ASSESSMENT & PLAN:    1. S/P AVR-continue SBE prophylaxia. 2. FU aortic root replacement due to aortic aneurysm/aortic dissection-plan CTA August 2021. 3. CAD-no CP; continue ASA and statin. 4. Hypertension-continue present meds. 5. Hyperlipidemia-continue statin.  COVID-19 Education: The importance of social distancing was discussed today.  Time:   Today, I have spent 18 minutes with the patient with telehealth technology discussing the above problems.     Medication Adjustments/Labs and Tests Ordered: Current medicines are reviewed at length with the patient today.  Concerns regarding medicines are outlined above.   Tests Ordered: No orders of the defined types were placed in this encounter.   Medication Changes: No orders of the defined types were placed in this encounter.   Follow Up:  Either In Person or Virtual in 6 month(s)  Signed, [name], MD  12/27/2018 9:20 AM    [place]

## 2018-12-27 ENCOUNTER — Encounter: Payer: Self-pay | Admitting: Cardiology

## 2018-12-27 ENCOUNTER — Telehealth (INDEPENDENT_AMBULATORY_CARE_PROVIDER_SITE_OTHER): Payer: Medicare Other | Admitting: Cardiology

## 2018-12-27 VITALS — Ht 70.0 in | Wt 190.0 lb

## 2018-12-27 DIAGNOSIS — I10 Essential (primary) hypertension: Secondary | ICD-10-CM | POA: Diagnosis not present

## 2018-12-27 DIAGNOSIS — I712 Thoracic aortic aneurysm, without rupture, unspecified: Secondary | ICD-10-CM

## 2018-12-27 DIAGNOSIS — Z952 Presence of prosthetic heart valve: Secondary | ICD-10-CM | POA: Diagnosis not present

## 2018-12-27 DIAGNOSIS — I2510 Atherosclerotic heart disease of native coronary artery without angina pectoris: Secondary | ICD-10-CM

## 2018-12-27 DIAGNOSIS — Z951 Presence of aortocoronary bypass graft: Secondary | ICD-10-CM | POA: Diagnosis not present

## 2018-12-27 NOTE — Patient Instructions (Signed)

## 2019-02-13 ENCOUNTER — Telehealth: Payer: Self-pay | Admitting: *Deleted

## 2019-02-13 NOTE — Telephone Encounter (Signed)
   [facility] Pre-operative Risk Assessment    Request for surgical clearance:  1. What type of surgery is being performed?  COLONOSCOPY  2. When is this surgery scheduled?  03/15/19   3. What type of clearance is required (medical clearance vs. Pharmacy clearance to hold med vs. Both)?  MEDICAL  4. Are there any medications that need to be held prior to surgery and how long? ASA AM OF TEST   5. Practice name and name of physician performing surgery?  [place]   6. What is your office phone number [phone]    7.   What is your office fax number [phone]  8.   Anesthesia type (None, local, MAC, general) ?  PROPOFOL   Devra Dopp 02/13/2019, 4:49 PM  _________________________________________________________________   (provider comments below)

## 2019-02-14 NOTE — Telephone Encounter (Signed)
   Primary Cardiologist: [name], MD  Chart reviewed as part of pre-operative protocol coverage. Given past medical history and time since last visit, based on ACC/AHA guidelines, BUTLER KEYE would be at acceptable risk for the planned procedure without further cardiovascular testing. May hold ASA day of colonoscopy.   I will route this recommendation to the requesting party via Epic fax function and remove from pre-op pool.  Please call with questions.  Phill Myron. West Pugh, ANP, AACC  02/14/2019, 8:41 AM

## 2019-02-27 ENCOUNTER — Telehealth: Payer: Self-pay | Admitting: Cardiology

## 2019-02-27 NOTE — Telephone Encounter (Signed)
Pt returned this office call and would like a call back please.   Stated thanks.

## 2019-02-27 NOTE — Telephone Encounter (Signed)
Continue to follow BP [name]

## 2019-02-27 NOTE — Telephone Encounter (Signed)
Patient has an [employer] BP cuff  Started taking BP on Dec 21st  133/87  131/97 140/84 Jan 16 125/85 Jan 16 164/96 Jan 15  Other readings: 125/85, 124/82, 139/91  BP this AM 102/71 @ 8:40am - indicated irregular heart beat  He said that after his virtual visit, MD asked if he was checking BP at home.   Confirmed he is taking meto tartrate as Rx'ed  Advised to monitor BP and will notify MD of readings

## 2019-02-27 NOTE — Telephone Encounter (Signed)
Pt c/o BP issue: STAT if pt c/o blurred vision, one-sided weakness or slurred speech  1. What are your last 5 BP readings? 125/85, 164/96, 124/82, 139/91, 140/87  2. Are you having any other symptoms (ex. Dizziness, headache, blurred vision, passed out)? no  3. What is your BP issue? Patient states his BP has been high. He says he has a new BP machine and has more readings and would like to know if he needs to send those in. He would like a call back with advice.

## 2019-02-27 NOTE — Telephone Encounter (Signed)
LMTCB

## 2019-02-28 NOTE — Telephone Encounter (Signed)
Spoke with pt, Aware of dr crenshaw's recommendations.  °

## 2019-03-15 ENCOUNTER — Ambulatory Visit: Admit: 2019-03-15 | Payer: Medicare Other | Admitting: Internal Medicine

## 2019-03-15 SURGERY — COLONOSCOPY WITH PROPOFOL
Anesthesia: General

## 2019-04-10 ENCOUNTER — Other Ambulatory Visit
Admission: RE | Admit: 2019-04-10 | Discharge: 2019-04-10 | Disposition: A | Discharge status: Home / Self Care | Payer: Medicare Other | Source: Ambulatory Visit | Attending: Internal Medicine | Admitting: Internal Medicine

## 2019-04-10 ENCOUNTER — Other Ambulatory Visit: Payer: Self-pay

## 2019-04-10 DIAGNOSIS — Z01812 Encounter for preprocedural laboratory examination: Secondary | ICD-10-CM | POA: Insufficient documentation

## 2019-04-10 DIAGNOSIS — Z20822 Contact with and (suspected) exposure to covid-19: Secondary | ICD-10-CM | POA: Diagnosis not present

## 2019-04-10 LAB — SARS CORONAVIRUS 2 (TAT 6-24 HRS): SARS Coronavirus 2: NEGATIVE

## 2019-04-11 ENCOUNTER — Encounter: Payer: Self-pay | Admitting: General Surgery

## 2019-04-12 ENCOUNTER — Ambulatory Visit: Admit: 2019-04-12 | Payer: Medicare Other | Admitting: General Surgery

## 2019-04-12 SURGERY — COLONOSCOPY WITH PROPOFOL
Anesthesia: General

## 2019-04-17 ENCOUNTER — Other Ambulatory Visit
Admission: RE | Admit: 2019-04-17 | Discharge: 2019-04-17 | Disposition: A | Discharge status: Home / Self Care | Payer: Medicare Other | Source: Ambulatory Visit | Attending: General Surgery | Admitting: General Surgery

## 2019-04-17 ENCOUNTER — Other Ambulatory Visit: Payer: Self-pay

## 2019-04-17 DIAGNOSIS — Z01812 Encounter for preprocedural laboratory examination: Secondary | ICD-10-CM | POA: Insufficient documentation

## 2019-04-17 DIAGNOSIS — Z20822 Contact with and (suspected) exposure to covid-19: Secondary | ICD-10-CM | POA: Diagnosis not present

## 2019-04-17 LAB — SARS CORONAVIRUS 2 (TAT 6-24 HRS): SARS Coronavirus 2: NEGATIVE

## 2019-04-18 ENCOUNTER — Encounter: Payer: Self-pay | Admitting: General Surgery

## 2019-04-19 ENCOUNTER — Ambulatory Visit: Payer: Medicare Other | Admitting: Certified Registered Nurse Anesthetist

## 2019-04-19 ENCOUNTER — Encounter: Payer: Self-pay | Admitting: General Surgery

## 2019-04-19 ENCOUNTER — Encounter
Admission: RE | Disposition: A | Discharge status: Home / Self Care | Payer: Self-pay | Source: Home / Self Care | Attending: General Surgery

## 2019-04-19 ENCOUNTER — Other Ambulatory Visit: Payer: Self-pay

## 2019-04-19 ENCOUNTER — Ambulatory Visit
Admission: RE | Admit: 2019-04-19 | Discharge: 2019-04-19 | Disposition: A | Discharge status: Home / Self Care | Payer: Medicare Other | Attending: General Surgery | Admitting: General Surgery

## 2019-04-19 DIAGNOSIS — I2510 Atherosclerotic heart disease of native coronary artery without angina pectoris: Secondary | ICD-10-CM | POA: Diagnosis not present

## 2019-04-19 DIAGNOSIS — Z87891 Personal history of nicotine dependence: Secondary | ICD-10-CM | POA: Diagnosis not present

## 2019-04-19 DIAGNOSIS — Z951 Presence of aortocoronary bypass graft: Secondary | ICD-10-CM | POA: Diagnosis not present

## 2019-04-19 DIAGNOSIS — Z1211 Encounter for screening for malignant neoplasm of colon: Secondary | ICD-10-CM | POA: Diagnosis not present

## 2019-04-19 DIAGNOSIS — Z79890 Hormone replacement therapy (postmenopausal): Secondary | ICD-10-CM | POA: Insufficient documentation

## 2019-04-19 DIAGNOSIS — E785 Hyperlipidemia, unspecified: Secondary | ICD-10-CM | POA: Insufficient documentation

## 2019-04-19 DIAGNOSIS — I10 Essential (primary) hypertension: Secondary | ICD-10-CM | POA: Diagnosis not present

## 2019-04-19 DIAGNOSIS — Z7982 Long term (current) use of aspirin: Secondary | ICD-10-CM | POA: Insufficient documentation

## 2019-04-19 DIAGNOSIS — I350 Nonrheumatic aortic (valve) stenosis: Secondary | ICD-10-CM | POA: Insufficient documentation

## 2019-04-19 DIAGNOSIS — Z8546 Personal history of malignant neoplasm of prostate: Secondary | ICD-10-CM | POA: Diagnosis not present

## 2019-04-19 DIAGNOSIS — Z86010 Personal history of colonic polyps: Secondary | ICD-10-CM | POA: Diagnosis present

## 2019-04-19 DIAGNOSIS — Z9079 Acquired absence of other genital organ(s): Secondary | ICD-10-CM | POA: Diagnosis not present

## 2019-04-19 DIAGNOSIS — M479 Spondylosis, unspecified: Secondary | ICD-10-CM | POA: Insufficient documentation

## 2019-04-19 DIAGNOSIS — Z923 Personal history of irradiation: Secondary | ICD-10-CM | POA: Diagnosis not present

## 2019-04-19 DIAGNOSIS — Z79899 Other long term (current) drug therapy: Secondary | ICD-10-CM | POA: Diagnosis not present

## 2019-04-19 DIAGNOSIS — K5730 Diverticulosis of large intestine without perforation or abscess without bleeding: Secondary | ICD-10-CM | POA: Diagnosis not present

## 2019-04-19 DIAGNOSIS — E039 Hypothyroidism, unspecified: Secondary | ICD-10-CM | POA: Diagnosis not present

## 2019-04-19 DIAGNOSIS — Z791 Long term (current) use of non-steroidal anti-inflammatories (NSAID): Secondary | ICD-10-CM | POA: Diagnosis not present

## 2019-04-19 HISTORY — PX: COLONOSCOPY WITH PROPOFOL: SHX5780

## 2019-04-19 HISTORY — DX: Headache, unspecified: R51.9

## 2019-04-19 SURGERY — COLONOSCOPY WITH PROPOFOL
Anesthesia: General

## 2019-04-19 MED ORDER — SODIUM CHLORIDE 0.9 % IV SOLN: INTRAVENOUS | Status: DC

## 2019-04-19 MED ORDER — GLYCOPYRROLATE 0.2 MG/ML IJ SOLN
INTRAMUSCULAR | Status: DC | PRN
  Administered 2019-04-19: .1 mg via INTRAVENOUS

## 2019-04-19 MED ORDER — PROPOFOL 500 MG/50ML IV EMUL
INTRAVENOUS | Status: DC | PRN
  Administered 2019-04-19: 150 ug/kg/min via INTRAVENOUS

## 2019-04-19 MED ORDER — PROPOFOL 10 MG/ML IV BOLUS
INTRAVENOUS | Status: DC | PRN
  Administered 2019-04-19: 70 mg via INTRAVENOUS

## 2019-04-19 NOTE — Anesthesia Preprocedure Evaluation (Signed)
Anesthesia Evaluation  Patient identified by MRN, date of birth, ID band Patient awake    Reviewed: Allergy & Precautions, NPO status , Patient's Chart, lab work & pertinent test results  History of Anesthesia Complications Negative for: history of anesthetic complications  Airway Mallampati: II       Dental   Pulmonary neg sleep apnea, neg COPD, Not current smoker, former smoker,           Cardiovascular hypertension, Pt. on medications (-) Past MI and (-) CHF (-) dysrhythmias + Valvular Problems/Murmurs (s/p aortic valve replacement, s/p thoracic aneursym repair) AS      Neuro/Psych neg Seizures    GI/Hepatic Neg liver ROS, neg GERD  ,  Endo/Other  neg diabetesHypothyroidism   Renal/GU negative Renal ROS     Musculoskeletal   Abdominal   Peds  Hematology   Anesthesia Other Findings   Reproductive/Obstetrics                             Anesthesia Physical Anesthesia Plan  ASA: III  Anesthesia Plan: General   Post-op Pain Management:    Induction: Intravenous  PONV Risk Score and Plan: 2 and Propofol infusion and TIVA  Airway Management Planned: Nasal Cannula  Additional Equipment:   Intra-op Plan:   Post-operative Plan:   Informed Consent: I have reviewed the patients History and Physical, chart, labs and discussed the procedure including the risks, benefits and alternatives for the proposed anesthesia with the patient or authorized representative who has indicated his/her understanding and acceptance.       Plan Discussed with:   Anesthesia Plan Comments:         Anesthesia Quick Evaluation

## 2019-04-19 NOTE — Op Note (Signed)
[facility] Gastroenterology Patient Name: Danny Odom Procedure Date: 04/19/2019 10:25 AM MRN: [id number] Account #: [id number] Date of Birth: 1950/05/22 Admit Type: Outpatient Age: 69 Room: [facility] ENDO ROOM 1 Gender: Male Note Status: Finalized Procedure:             Colonoscopy Indications:           High risk colon cancer surveillance: Personal history                         of colonic polyps Providers:             Robert Bellow, MD Referring MD:          Rusty Aus, MD (Referring MD) Medicines:             Monitored Anesthesia Care Complications:         No immediate complications. Procedure:             Pre-Anesthesia Assessment:                        - Prior to the procedure, a History and Physical was                         performed, and patient medications, allergies and                         sensitivities were reviewed. The patient's tolerance                         of previous anesthesia was reviewed.                        - The risks and benefits of the procedure and the                         sedation options and risks were discussed with the                         patient. All questions were answered and informed                         consent was obtained.                        After obtaining informed consent, the colonoscope was                         passed under direct vision. Throughout the procedure,                         the patient's blood pressure, pulse, and oxygen                         saturations were monitored continuously. The                         Colonoscope was introduced through the anus and                         advanced to the the cecum,  identified by appendiceal                         orifice and ileocecal valve. The colonoscopy was                         performed without difficulty. The patient tolerated                         the procedure well. The quality of the bowel   preparation was excellent. Findings:      A few medium-mouthed diverticula were found in the sigmoid colon.      The retroflexed view of the distal rectum and anal verge was normal and       showed no anal or rectal abnormalities. Impression:            - Diverticulosis in the sigmoid colon.                        - The distal rectum and anal verge are normal on                         retroflexion view.                        - No specimens collected. Recommendation:        - Repeat colonoscopy in 5 years for surveillance. Procedure Code(s):     --- Professional ---                        [phone], Colonoscopy, flexible; diagnostic, including                         collection of specimen(s) by brushing or washing, when                         performed (separate procedure) Diagnosis Code(s):     --- Professional ---                        Z86.010, Personal history of colonic polyps                        K57.30, Diverticulosis of large intestine without                         perforation or abscess without bleeding CPT copyright 2019 American Medical Association. All rights reserved. The codes documented in this report are preliminary and upon coder review may  be revised to meet current compliance requirements. Robert Bellow, MD 04/19/2019 10:54:53 AM This report has been signed electronically. Number of Addenda: 0 Note Initiated On: 04/19/2019 10:25 AM Scope Withdrawal Time: 0 hours 11 minutes 20 seconds  Total Procedure Duration: 0 hours 17 minutes 20 seconds       [facility]

## 2019-04-19 NOTE — Transfer of Care (Signed)
Immediate Anesthesia Transfer of Care Note  Patient: SAMORY IZER  Procedure(s) Performed: COLONOSCOPY WITH PROPOFOL (N/A )  Patient Location: PACU  Anesthesia Type:General  Level of Consciousness: sedated  Airway & Oxygen Therapy: Patient Spontanous Breathing and Patient connected to nasal cannula oxygen  Post-op Assessment: Report given to RN and Post -op Vital signs reviewed and stable  Post vital signs: Reviewed and stable  Last Vitals:  Vitals Value Taken Time  BP 87/56 04/19/19 1056  Temp 36.8 C 04/19/19 1056  Pulse 96 04/19/19 1056  Resp 17 04/19/19 1056  SpO2 95 % 04/19/19 1056    Last Pain:  Vitals:   04/19/19 1056  TempSrc: Temporal  PainSc: Asleep         Complications: No apparent anesthesia complications

## 2019-04-19 NOTE — H&P (Signed)
Danny Odom [id number] 1950/04/25     HPI: 69 year old male with a family history of colon polyps.  No present GI symptoms.  Tolerated prep well.  Status post bovine aortic valve followed by ascending aortic aneurysm repair in 2020.  Presently on aspirin.  Medications Prior to Admission  Medication Sig Dispense Refill Last Dose  . aspirin EC 81 MG tablet Take 81 mg by mouth daily.   Past Month at Unknown time  . atorvastatin (LIPITOR) 40 MG tablet Take 1 tablet (40 mg total) by mouth at bedtime. 14 tablet 0 04/18/2019 at Unknown time  . levothyroxine (SYNTHROID) 75 MCG tablet Take 1 tablet (75 mcg total) by mouth daily before breakfast. 14 tablet 0 04/19/2019 at Unknown time  . losartan (COZAAR) 50 MG tablet Take 50 mg by mouth daily.     . metoprolol tartrate (LOPRESSOR) 25 MG tablet Take 0.5 tablets (12.5 mg total) by mouth 2 (two) times daily. 14 tablet 0 04/19/2019 at Unknown time  . Multiple Vitamin (MULTIVITAMIN WITH MINERALS) TABS tablet Take 1 tablet by mouth daily with breakfast. ONE-A-DAY MEN'S 50+   Past Month at Unknown time  . Psyllium (METAMUCIL PO) Take by mouth daily.   Past Week at Unknown time  . acetaminophen (TYLENOL) 500 MG tablet Take 500 mg by mouth as needed.   Not Taking at Unknown time  . diclofenac (VOLTAREN) 75 MG EC tablet Take 75 mg by mouth once. Take 2nd dose if needed   Not Taking at Unknown time  . furosemide (LASIX) 40 MG tablet Take 40 mg by mouth.   Not Taking at Unknown time  . potassium chloride (KLOR-CON) 10 MEQ tablet Take 10 mEq by mouth daily.   Not Taking at Unknown time  . sennosides-docusate sodium (SENOKOT-S) 8.6-50 MG tablet Take 1 tablet by mouth daily.   Not Taking at Unknown time  . traMADol (ULTRAM) 50 MG tablet Take 1-2 tablets (50-100 mg total) by mouth every 4 (four) hours as needed for moderate pain. (Patient not taking: Reported on 04/19/2019) 30 tablet 0 Not Taking at Unknown time  . zinc gluconate 50 MG tablet Take 50 mg by mouth at  bedtime.    Not Taking at Unknown time   No Known Allergies Past Medical History:  Diagnosis Date  . Aortic stenosis 2014   08-2017  severe;   s/p  avr   . Arthritis    back and joints  . Cardiac murmur   . Coronary artery disease cardiologist-- dr Stanford Breed   10-01-2017  s/p  cabg x2  . ED (erectile dysfunction)   . Headache   . Hyperlipidemia   . Hypertension   . Hypothyroid    followed by pcp  . Nocturia   . Primary prostate cancer with metastasis from prostate to other site [facility]) urologist-- dr Tresa Moore   first dx 08-01-2013;  Stage T3a, gleason 3+4;  s/p  radical prostatectomy w/ node dissection 10-20-2013  and completed post surgery IMRT  04-19-2014  . S/P aortic valve replacement 10/01/2017   edwards pericardial valve  . S/P CABG x 2 10/01/2017   LIMA to Diagonal;   SVG to OM  . S/P radiation therapy 03/05/2014 through 04/19/2014   Prostate bed 6600 cGy in 33 Odom, pelvic lymph nodes 5000 445 cGy in 33 Odom   . Wears glasses    Past Surgical History:  Procedure Laterality Date  . AORTIC VALVE REPLACEMENT N/A 10/01/2017   Procedure: AORTIC VALVE REPLACEMENT (AVR);  Surgeon: Cyndia Bent,  Fernande Boyden, MD;  Location: [place];  Service: Open Heart Surgery;  Laterality: N/A;  . ASCENDING AORTIC ROOT REPLACEMENT N/A 09/21/2018   Procedure: REPAIR OF ASCENDING AORTIC ANEURYSM USING 32 MM HEMASHIELD PLATINUM WOVEN DOUBLE VELOUR VASCULAR GRAFT WITH REATTACHMENT OF PREVIOUS SAPHENOUS VEIN GRAFT ;  Surgeon: [name], MD;  Location: [place];  Service: Open Heart Surgery;  Laterality: N/A;  . COLONOSCOPY    . CORONARY ARTERY BYPASS GRAFT N/A 10/01/2017   Procedure: CORONARY ARTERY BYPASS GRAFTING (CABG) x two, using left internal mammary artery and right leg greater saphenous vein harvested endoscopically;  Surgeon: Gaye Pollack, MD;  Location: [place] OR;  Service: Open Heart Surgery;  Laterality: N/A;  . CORONARY ARTERY BYPASS GRAFT N/A 09/21/2018   Procedure: OPEN  RIGHT SAPHENOUS VEIN HARVEST;  Surgeon: [name], MD;  Location: [place];  Service: Open Heart Surgery;  Laterality: N/A;  . LYMPHADENECTOMY Bilateral 10/20/2013   Procedure: BILATERAL PELVIC LYMPH NODE DISSECTION;  Surgeon: Alexis Frock, MD;  [facility];  Service: Urology;  Laterality: Bilateral;  . ORCHIECTOMY Bilateral 08/26/2018   Procedure: ORCHIECTOMY;  Surgeon: Alexis Frock, MD;  Location: [facility];  Service: Urology;  Laterality: Bilateral;  . RIGHT/LEFT HEART CATH AND CORONARY ANGIOGRAPHY N/A 08/23/2017   Procedure: RIGHT/LEFT HEART CATH AND CORONARY ANGIOGRAPHY;  Surgeon: Leonie Man, MD;  Location: [place] CV LAB;  Service: Cardiovascular;  Laterality: N/A;  . ROBOT ASSISTED LAPAROSCOPIC RADICAL PROSTATECTOMY N/A 10/20/2013   Procedure: ROBOTIC ASSISTED LAPAROSCOPIC RADICAL PROSTATECTOMY WITH INDOCYANINE GREEN DYE;  Surgeon: Alexis Frock, MD;  [facility];  Service: Urology;  Laterality: N/A;  . TEE WITHOUT CARDIOVERSION N/A 10/01/2017   Procedure: TRANSESOPHAGEAL ECHOCARDIOGRAM (TEE);  Surgeon: Gaye Pollack, MD;  Location: [place];  Service: Open Heart Surgery;  Laterality: N/A;  . TEE WITHOUT CARDIOVERSION N/A 09/21/2018   Procedure: TRANSESOPHAGEAL ECHOCARDIOGRAM (TEE);  Surgeon: [name], MD;  Location: [place];  Service: Open Heart Surgery;  Laterality: N/A;  . TONSILLECTOMY  child  . varicocele surgery  Right 1983 aaprx   Social History   Socioeconomic History  . Marital status: Divorced    Spouse name: Not on file  . Number of children: 2  . Years of education: Not on file  . Highest education level: Not on file  Occupational History  . Not on file  Tobacco Use  . Smoking status: Former Smoker    Packs/day: 1.00    Years: 20.00    Pack years: 20.00    Types: Cigarettes    Quit date: 02/15/1983    Years since quitting: 36.1  . Smokeless tobacco: Never Used  Substance and Sexual Activity  . Alcohol use: Not  Currently    Alcohol/week: 0.0 standard drinks    Comment: rarely   . Drug use: Never  . Sexual activity: Not on file  Other Topics Concern  . Not on file  Social History Narrative  . Not on file   Social Determinants of Health   Financial Resource Strain:   . Difficulty of Paying Living Expenses: Not on file  Food Insecurity:   . Worried About Charity fundraiser in the Last Year: Not on file  . Ran Out of Food in the Last Year: Not on file  Transportation Needs:   . Lack of Transportation (Medical): Not on file  . Lack of Transportation (Non-Medical): Not on file  Physical Activity:   . Days of Exercise per Week:  Not on file  . Minutes of Exercise per Session: Not on file  Stress:   . Feeling of Stress : Not on file  Social Connections:   . Frequency of Communication with Friends and Family: Not on file  . Frequency of Social Gatherings with Friends and Family: Not on file  . Attends Religious Services: Not on file  . Active Member of Clubs or Organizations: Not on file  . Attends Archivist Meetings: Not on file  . Marital Status: Not on file  Intimate Partner Violence:   . Fear of Current or Ex-Partner: Not on file  . Emotionally Abused: Not on file  . Physically Abused: Not on file  . Sexually Abused: Not on file   Social History   Social History Narrative  . Not on file     ROS: Negative.     PE: HEENT: Negative. Lungs: Clear. Cardio: RR.  Assessment/Plan:  Proceed with planned endoscopy.   Forest Gleason [facility] 04/19/2019

## 2019-04-19 NOTE — Anesthesia Postprocedure Evaluation (Signed)
Anesthesia Post Note  Patient: Danny Odom  Procedure(s) Performed: COLONOSCOPY WITH PROPOFOL (N/A )  Patient location during evaluation: Endoscopy Anesthesia Type: General Level of consciousness: awake and alert Pain management: pain level controlled Vital Signs Assessment: post-procedure vital signs reviewed and stable Respiratory status: spontaneous breathing and respiratory function stable Cardiovascular status: stable Anesthetic complications: no     Last Vitals:  Vitals:   04/19/19 1106 04/19/19 1116  BP: 92/63 109/80  Pulse: 61 64  Resp: 16 17  Temp:    SpO2: 93% 96%    Last Pain:  Vitals:   04/19/19 1116  TempSrc:   PainSc: 0-No pain                 Danny Odom

## 2019-04-20 ENCOUNTER — Encounter: Payer: Self-pay | Admitting: *Deleted

## 2019-06-30 ENCOUNTER — Ambulatory Visit: Payer: Medicare Other | Admitting: Cardiology

## 2019-08-03 ENCOUNTER — Telehealth: Payer: Medicare Other | Admitting: Cardiology

## 2019-09-05 ENCOUNTER — Encounter: Payer: Self-pay | Admitting: *Deleted

## 2019-09-05 DIAGNOSIS — I712 Thoracic aortic aneurysm, without rupture, unspecified: Secondary | ICD-10-CM

## 2019-09-05 NOTE — Addendum Note (Signed)
Addended by: Cristopher Estimable on: 09/05/2019 02:43 PM   Modules accepted: Orders

## 2019-09-06 ENCOUNTER — Telehealth: Payer: Self-pay | Admitting: Cardiology

## 2019-09-06 NOTE — Telephone Encounter (Signed)
Left message for patient to call to discuss scheduling CTA chest/aorta ordered by Dr. Stanford Breed

## 2019-09-06 NOTE — Telephone Encounter (Signed)
Spoke with patient regarding appointment for CTA chest/aorta scheduled Monday 09/25/19 at [facility]--arrival time is 2:15pm at the Outpatient Registration area --testing to start at 2:30 pm---liquids only 4 hours prior to study.  Patient voiced his understanding.

## 2019-09-08 ENCOUNTER — Other Ambulatory Visit: Payer: Self-pay | Admitting: *Deleted

## 2019-09-08 DIAGNOSIS — I712 Thoracic aortic aneurysm, without rupture, unspecified: Secondary | ICD-10-CM

## 2019-09-08 NOTE — Progress Notes (Signed)
bmp 

## 2019-09-11 ENCOUNTER — Telehealth: Payer: Self-pay | Admitting: Cardiology

## 2019-09-11 NOTE — Telephone Encounter (Signed)
Left message for patient to call and discuss lab work to be completed prior to CTA chest aorta scheduled Monday 09/25/19

## 2019-09-12 LAB — BASIC METABOLIC PANEL
BUN/Creatinine Ratio: 12 (ref 10–24)
BUN: 14 mg/dL (ref 8–27)
CO2: 24 mmol/L (ref 20–29)
Calcium: 9.6 mg/dL (ref 8.6–10.2)
Chloride: 104 mmol/L (ref 96–106)
Creatinine, Ser: 1.14 mg/dL (ref 0.76–1.27)
GFR calc Af Amer: 75 mL/min/{1.73_m2} (ref >59)
GFR calc non Af Amer: 65 mL/min/{1.73_m2} (ref >59)
Glucose: 95 mg/dL (ref 65–99)
Potassium: 4.7 mmol/L (ref 3.5–5.2)
Sodium: 144 mmol/L (ref 134–144)

## 2019-09-12 NOTE — Telephone Encounter (Signed)
Left message for patient to call and discuss lab work needed prior to CTA scheduled 09/25/19

## 2019-09-25 ENCOUNTER — Ambulatory Visit
Admission: RE | Admit: 2019-09-25 | Discharge: 2019-09-25 | Disposition: A | Discharge status: Home / Self Care | Payer: BC Managed Care – PPO | Source: Ambulatory Visit | Attending: Cardiology | Admitting: Cardiology

## 2019-09-25 ENCOUNTER — Other Ambulatory Visit: Payer: Self-pay

## 2019-09-25 DIAGNOSIS — I712 Thoracic aortic aneurysm, without rupture, unspecified: Secondary | ICD-10-CM

## 2019-09-25 MED ORDER — IOHEXOL 350 MG/ML SOLN
75.0000 mL | Freq: Once | INTRAVENOUS | Status: AC | PRN
  Administered 2019-09-25: 75 mL via INTRAVENOUS

## 2019-10-10 NOTE — Progress Notes (Signed)
HPI: FUAVR and CAD. Echocardiogram repeated July 2019and showed normal LV function, functionally bicuspid aortic valve with moderate aortic stenosis (mean gradient 30 mmHg), moderate aortic insufficiency,mild mitral regurgitation.CTA July 2019 showed 3.8 to 3.9 cm aortic root. Cardiac catheterization July 2019 showed normal LV function, left ventricular end-diastolic pressure 23 mmHg, moderate aortic stenosis, 90% first diagonal and 75% circumflex. Preoperative carotid Dopplers August 2019 showed no significant stenosis. Patient had aortic valve replacement with pericardial valve as well as coronary artery bypass and graft with a LIMA to the diagonal and saphenous vein graft to the obtuse marginal in August 2019.Echo 8/20 showed normal LV function; prior AVR with norma function and no AI. CTA 8/20 showed ascending aortic aneurysm of 5 cm and ascending dissection. Pt subsequently had ascending aortic root replacement 09/21/18.  Follow-up CTA August 2021 showed previous repair of a sending aorta without recurrence of pseudoaneurysm, prior aortic valve replacement.  Since last seen, the patient denies any dyspnea on exertion, orthopnea, PND, pedal edema, palpitations, syncope or chest pain.   Current Outpatient Medications  Medication Sig Dispense Refill   acetaminophen (TYLENOL) 500 MG tablet Take 500 mg by mouth as needed.     aspirin EC 81 MG tablet Take 81 mg by mouth daily.     atorvastatin (LIPITOR) 40 MG tablet Take 1 tablet (40 mg total) by mouth at bedtime. 14 tablet 0   levothyroxine (SYNTHROID) 75 MCG tablet Take 1 tablet (75 mcg total) by mouth daily before breakfast. 14 tablet 0   metoprolol tartrate (LOPRESSOR) 25 MG tablet Take 0.5 tablets (12.5 mg total) by mouth 2 (two) times daily. 14 tablet 0   Multiple Vitamin (MULTIVITAMIN WITH MINERALS) TABS tablet Take 1 tablet by mouth daily with breakfast. ONE-A-DAY MEN'S 50+     Psyllium (METAMUCIL PO) Take by mouth daily.      traMADol (ULTRAM) 50 MG tablet Take 1-2 tablets (50-100 mg total) by mouth every 4 (four) hours as needed for moderate pain. 30 tablet 0   vitamin B-12 (CYANOCOBALAMIN) 500 MCG tablet Take 500 mcg by mouth daily. 1 Tablet Daily     zinc gluconate 50 MG tablet Take 50 mg by mouth at bedtime.      No current facility-administered medications for this visit.     Past Medical History:  Diagnosis Date   Aortic stenosis 2014   08-2017  severe;   s/p  avr    Arthritis    back and joints   Cardiac murmur    Coronary artery disease cardiologist-- dr Stanford Breed   10-01-2017  s/p  cabg x2   ED (erectile dysfunction)    Headache    Hyperlipidemia    Hypertension    Hypothyroid    followed by pcp   Nocturia    Primary prostate cancer with metastasis from prostate to other site [facility]) urologist-- dr Tresa Moore   first dx 08-01-2013;  Stage T3a, gleason 3+4;  s/p  radical prostatectomy w/ node dissection 10-20-2013  and completed post surgery IMRT  04-19-2014   S/P aortic valve replacement 10/01/2017   edwards pericardial valve   S/P CABG x 2 10/01/2017   LIMA to Diagonal;   SVG to OM   S/P radiation therapy 03/05/2014 through 04/19/2014   Prostate bed 6600 cGy in 33 sessions, pelvic lymph nodes 5000 445 cGy in 33 sessions    Wears glasses     Past Surgical History:  Procedure Laterality Date   AORTIC VALVE REPLACEMENT N/A 10/01/2017  Procedure: AORTIC VALVE REPLACEMENT (AVR);  Surgeon: Gaye Pollack, MD;  Location: [place];  Service: Open Heart Surgery;  Laterality: N/A;   ASCENDING AORTIC ROOT REPLACEMENT N/A 09/21/2018   Procedure: REPAIR OF ASCENDING AORTIC ANEURYSM USING 32 MM HEMASHIELD PLATINUM WOVEN DOUBLE VELOUR VASCULAR GRAFT WITH REATTACHMENT OF PREVIOUS SAPHENOUS VEIN GRAFT ;  Surgeon: [name], MD;  Location: [place];  Service: Open Heart Surgery;  Laterality: N/A;   COLONOSCOPY     COLONOSCOPY WITH PROPOFOL N/A 04/19/2019    Procedure: COLONOSCOPY WITH PROPOFOL;  Surgeon: Robert Bellow, MD;  Location: [facility];  Service: Gastroenterology;  Laterality: N/A;   CORONARY ARTERY BYPASS GRAFT N/A 10/01/2017   Procedure: CORONARY ARTERY BYPASS GRAFTING (CABG) x two, using left internal mammary artery and right leg greater saphenous vein harvested endoscopically;  Surgeon: Gaye Pollack, MD;  Location: [place] OR;  Service: Open Heart Surgery;  Laterality: N/A;   CORONARY ARTERY BYPASS GRAFT N/A 09/21/2018   Procedure: OPEN RIGHT SAPHENOUS VEIN HARVEST;  Surgeon: [name], MD;  Location: [place];  Service: Open Heart Surgery;  Laterality: N/A;   LYMPHADENECTOMY Bilateral 10/20/2013   Procedure: BILATERAL PELVIC LYMPH NODE DISSECTION;  Surgeon: Alexis Frock, MD;  [facility];  Service: Urology;  Laterality: Bilateral;   ORCHIECTOMY Bilateral 08/26/2018   Procedure: ORCHIECTOMY;  Surgeon: Alexis Frock, MD;  Location: [facility];  Service: Urology;  Laterality: Bilateral;   RIGHT/LEFT HEART CATH AND CORONARY ANGIOGRAPHY N/A 08/23/2017   Procedure: RIGHT/LEFT HEART CATH AND CORONARY ANGIOGRAPHY;  Surgeon: Leonie Man, MD;  Location: [place] CV [facility];  Service: Cardiovascular;  Laterality: N/A;   ROBOT ASSISTED LAPAROSCOPIC RADICAL PROSTATECTOMY N/A 10/20/2013   Procedure: ROBOTIC ASSISTED LAPAROSCOPIC RADICAL PROSTATECTOMY WITH INDOCYANINE GREEN DYE;  Surgeon: Alexis Frock, MD;  [facility];  Service: Urology;  Laterality: N/A;   TEE WITHOUT CARDIOVERSION N/A 10/01/2017   Procedure: TRANSESOPHAGEAL ECHOCARDIOGRAM (TEE);  Surgeon: Gaye Pollack, MD;  Location: [place];  Service: Open Heart Surgery;  Laterality: N/A;   TEE WITHOUT CARDIOVERSION N/A 09/21/2018   Procedure: TRANSESOPHAGEAL ECHOCARDIOGRAM (TEE);  Surgeon: [name], MD;  Location: [place];  Service: Open Heart Surgery;  Laterality: N/A;   TONSILLECTOMY  child   varicocele surgery  Right 1983 aaprx     Social History   Socioeconomic History   Marital status: Divorced    Spouse name: Not on file   Number of children: 2   Years of education: Not on file   Highest education level: Not on file  Occupational History   Not on file  Tobacco Use   Smoking status: Former Smoker    Packs/day: 1.00    Years: 20.00    Pack years: 20.00    Types: Cigarettes    Quit date: 02/15/1983    Years since quitting: 36.6   Smokeless tobacco: Never Used  Vaping Use   Vaping Use: Never used  Substance and Sexual Activity   Alcohol use: Not Currently    Alcohol/week: 0.0 standard drinks    Comment: rarely    Drug use: Never   Sexual activity: Not on file  Other Topics Concern   Not on file  Social History Narrative   Not on file   Social Determinants of Health   Financial Resource Strain:    Difficulty of Paying Living Expenses: Not on file  Food Insecurity:    Worried About [place] in the Last Year: Not on  file   [place] in the Last Year: Not on file  Transportation Needs:    Lack of Transportation (Medical): Not on file   Lack of Transportation (Non-Medical): Not on file  Physical Activity:    Days of Exercise per Week: Not on file   Minutes of Exercise per Session: Not on file  Stress:    Feeling of Stress : Not on file  Social Connections:    Frequency of Communication with Friends and Family: Not on file   Frequency of Social Gatherings with Friends and Family: Not on file   Attends Religious Services: Not on file   Active Member of Clubs or Organizations: Not on file   Attends Archivist Meetings: Not on file   Marital Status: Not on file  Intimate Partner Violence:    Fear of Current or Ex-Partner: Not on file   Emotionally Abused: Not on file   Physically Abused: Not on file   Sexually Abused: Not on file    Family History  Problem Relation Age of Onset   Emphysema Father    Prostate cancer Father     Breast cancer Mother     ROS: no fevers or chills, productive cough, hemoptysis, dysphasia, odynophagia, melena, hematochezia, dysuria, hematuria, rash, seizure activity, orthopnea, PND, pedal edema, claudication. Remaining systems are negative.  Physical Exam: Well-developed well-nourished in no acute distress.  Skin is warm and dry.  HEENT is normal.  Neck is supple.  Chest is clear to auscultation with normal expansion.  Cardiovascular exam is regular rate and rhythm.  Abdominal exam nontender or distended. No masses palpated. Extremities show no edema. neuro grossly intact  ECG-normal sinus rhythm at a rate of 62, nonspecific anterior lateral T wave changes.  Personally reviewed  A/P  1 prior aortic valve replacement-continue SBE prophylaxis.  2 status post aortic root replacement due to aortic aneurysm/aortic dissection-recent CTA showed stable repair.  3 coronary artery disease-patient denies chest pain.  Continue medical therapy with aspirin and statin.  4 hypertension-patient's blood pressure is elevated; add losartan 50 mg daily.  In 1 week check potassium and renal function.  We will increase medications as needed.  5 hyperlipidemia-continue statin.  Check lipids and liver.  [name], MD

## 2019-10-12 ENCOUNTER — Other Ambulatory Visit: Payer: Self-pay

## 2019-10-12 ENCOUNTER — Ambulatory Visit (INDEPENDENT_AMBULATORY_CARE_PROVIDER_SITE_OTHER): Payer: BC Managed Care – PPO | Admitting: Cardiology

## 2019-10-12 ENCOUNTER — Encounter: Payer: Self-pay | Admitting: Cardiology

## 2019-10-12 VITALS — BP 150/100 | HR 62 | Ht 69.5 in | Wt 202.0 lb

## 2019-10-12 DIAGNOSIS — Z952 Presence of prosthetic heart valve: Secondary | ICD-10-CM | POA: Diagnosis not present

## 2019-10-12 DIAGNOSIS — E7800 Pure hypercholesterolemia, unspecified: Secondary | ICD-10-CM | POA: Diagnosis not present

## 2019-10-12 DIAGNOSIS — I10 Essential (primary) hypertension: Secondary | ICD-10-CM | POA: Diagnosis not present

## 2019-10-12 DIAGNOSIS — I2510 Atherosclerotic heart disease of native coronary artery without angina pectoris: Secondary | ICD-10-CM | POA: Diagnosis not present

## 2019-10-12 MED ORDER — LOSARTAN POTASSIUM 50 MG PO TABS: 50.0000 mg | ORAL_TABLET | Freq: Every day | ORAL | 3 refills | Status: DC

## 2019-10-12 NOTE — Patient Instructions (Signed)
Medication Instructions:   START LOSARTAN 50 MG ONCE DAILY  *If you need a refill on your cardiac medications before your next appointment, please call your pharmacy*   Lab Work:  Your physician recommends that you return for lab work in: [place]  If you have labs (blood work) drawn today and your tests are completely normal, you will receive your results only by: [name] MyChart Message (if you have MyChart) OR . A paper copy in the mail If you have any lab test that is abnormal or we need to change your treatment, we will call you to review the results.   Follow-Up: At [facility], you and your health needs are our priority.  As part of our continuing mission to provide you with exceptional heart care, we have created designated Provider Care Teams.  These Care Teams include your primary Cardiologist (physician) and Advanced Practice Providers (APPs -  Physician Assistants and Nurse Practitioners) who all work together to provide you with the care you need, when you need it.  We recommend signing up for the patient portal called "MyChart".  Sign up information is provided on this After Visit Summary.  MyChart is used to connect with patients for Virtual Visits (Telemedicine).  Patients are able to view lab/test results, encounter notes, upcoming appointments, etc.  Non-urgent messages can be sent to your provider as well.   To learn more about what you can do with MyChart, go to [email or web address].    Your next appointment:   6 month(s)  The format for your next appointment:   In Person  Provider:   You may see [name], MD or one of the following Advanced Practice Providers on your designated Care Team:    Kerin Ransom, PA-C  [place], [place]  Coletta Memos, [place]

## 2019-10-19 NOTE — Addendum Note (Signed)
Addended by: Zebedee Iba on: 10/19/2019 01:56 PM   Modules accepted: Orders

## 2020-04-03 NOTE — Progress Notes (Signed)
HPI: FUAVR and CAD. Echocardiogram repeated July 2019and showed normal LV function, functionally bicuspid aortic valve with moderate aortic stenosis (mean gradient 30 mmHg), moderate aortic insufficiency,mild mitral regurgitation.CTA July 2019 showed 3.8 to 3.9 cm aortic root. Cardiac catheterization July 2019 showed normal LV function, left ventricular end-diastolic pressure 23 mmHg, moderate aortic stenosis, 90% first diagonal and 75% circumflex. Preoperative carotid Dopplers August 2019 showed no significant stenosis. Patient had aortic valve replacement with pericardial valve as well as coronary artery bypass and graft with a LIMA to the diagonal and saphenous vein graft to the obtuse marginal in August 2019.Echo 8/20 showed normal LV function; prior AVR with normal function and no AI. CTA 8/20 showed ascending aortic aneurysm of 5 cm and ascending dissection. Pt subsequently had ascending aortic root replacement 09/21/18. Follow-up CTA August 2021 showed previous repair of ascending aorta without recurrence of pseudoaneurysm, prior aortic valve replacement.  Since last seen,the patient denies any dyspnea on exertion, orthopnea, PND, pedal edema, palpitations, syncope or chest pain.   Current Outpatient Medications  Medication Sig Dispense Refill  . acetaminophen (TYLENOL) 500 MG tablet Take 500 mg by mouth as needed.    [name] atorvastatin (LIPITOR) 40 MG tablet Take 1 tablet (40 mg total) by mouth at bedtime. 14 tablet 0  . levothyroxine (SYNTHROID) 75 MCG tablet Take 1 tablet (75 mcg total) by mouth daily before breakfast. 14 tablet 0  . metoprolol tartrate (LOPRESSOR) 25 MG tablet Take 0.5 tablets (12.5 mg total) by mouth 2 (two) times daily. 14 tablet 0  . Multiple Vitamin (MULTIVITAMIN WITH MINERALS) TABS tablet Take 1 tablet by mouth daily with breakfast. ONE-A-DAY MEN'S 50+    . Psyllium (METAMUCIL PO) Take by mouth daily.    . traMADol (ULTRAM) 50 MG tablet Take 1-2 tablets  (50-100 mg total) by mouth every 4 (four) hours as needed for moderate pain. 30 tablet 0  . vitamin B-12 (CYANOCOBALAMIN) 500 MCG tablet Take 500 mcg by mouth daily. 1 Tablet Daily    . zinc gluconate 50 MG tablet Take 50 mg by mouth at bedtime.     [name] losartan (COZAAR) 50 MG tablet Take 1 tablet (50 mg total) by mouth daily. 90 tablet 3   No current facility-administered medications for this visit.     Past Medical History:  Diagnosis Date  . Aortic stenosis 2014   08-2017  severe;   s/p  avr   . Arthritis    back and joints  . Cardiac murmur   . Coronary artery disease cardiologist-- dr Stanford Breed   10-01-2017  s/p  cabg x2  . ED (erectile dysfunction)   . Headache   . Hyperlipidemia   . Hypertension   . Hypothyroid    followed by pcp  . Nocturia   . Primary prostate cancer with metastasis from prostate to other site [facility]) urologist-- dr Tresa Moore   first dx 08-01-2013;  Stage T3a, gleason 3+4;  s/p  radical prostatectomy w/ node dissection 10-20-2013  and completed post surgery IMRT  04-19-2014  . S/P aortic valve replacement 10/01/2017   edwards pericardial valve  . S/P CABG x 2 10/01/2017   LIMA to Diagonal;   SVG to OM  . S/P radiation therapy 03/05/2014 through 04/19/2014   Prostate bed 6600 cGy in 33 sessions, pelvic lymph nodes 5000 445 cGy in 33 sessions   . Wears glasses     Past Surgical History:  Procedure Laterality Date  . AORTIC VALVE REPLACEMENT N/A 10/01/2017  Procedure: AORTIC VALVE REPLACEMENT (AVR);  Surgeon: Gaye Pollack, MD;  Location: [place];  Service: Open Heart Surgery;  Laterality: N/A;  . ASCENDING AORTIC ROOT REPLACEMENT N/A 09/21/2018   Procedure: REPAIR OF ASCENDING AORTIC ANEURYSM USING 32 MM HEMASHIELD PLATINUM WOVEN DOUBLE VELOUR VASCULAR GRAFT WITH REATTACHMENT OF PREVIOUS SAPHENOUS VEIN GRAFT ;  Surgeon: [name], MD;  Location: [place];  Service: Open Heart Surgery;  Laterality: N/A;  . COLONOSCOPY    .  COLONOSCOPY WITH PROPOFOL N/A 04/19/2019   Procedure: COLONOSCOPY WITH PROPOFOL;  Surgeon: Robert Bellow, MD;  Location: [facility] ENDOSCOPY;  Service: Gastroenterology;  Laterality: N/A;  . CORONARY ARTERY BYPASS GRAFT N/A 10/01/2017   Procedure: CORONARY ARTERY BYPASS GRAFTING (CABG) x two, using left internal mammary artery and right leg greater saphenous vein harvested endoscopically;  Surgeon: Gaye Pollack, MD;  Location: [place] [facility];  Service: Open Heart Surgery;  Laterality: N/A;  . CORONARY ARTERY BYPASS GRAFT N/A 09/21/2018   Procedure: OPEN RIGHT SAPHENOUS VEIN HARVEST;  Surgeon: [name], MD;  Location: [place];  Service: Open Heart Surgery;  Laterality: N/A;  . LYMPHADENECTOMY Bilateral 10/20/2013   Procedure: BILATERAL PELVIC LYMPH NODE DISSECTION;  Surgeon: Alexis Frock, MD;  Location: [facility];  Service: Urology;  Laterality: Bilateral;  . ORCHIECTOMY Bilateral 08/26/2018   Procedure: ORCHIECTOMY;  Surgeon: Alexis Frock, MD;  Location: [facility];  Service: Urology;  Laterality: Bilateral;  . RIGHT/LEFT HEART CATH AND CORONARY ANGIOGRAPHY N/A 08/23/2017   Procedure: RIGHT/LEFT HEART CATH AND CORONARY ANGIOGRAPHY;  Surgeon: Leonie Man, MD;  Location: [place] CV LAB;  Service: Cardiovascular;  Laterality: N/A;  . ROBOT ASSISTED LAPAROSCOPIC RADICAL PROSTATECTOMY N/A 10/20/2013   Procedure: ROBOTIC ASSISTED LAPAROSCOPIC RADICAL PROSTATECTOMY WITH INDOCYANINE GREEN DYE;  Surgeon: Alexis Frock, MD;  Location: [facility];  Service: Urology;  Laterality: N/A;  . TEE WITHOUT CARDIOVERSION N/A 10/01/2017   Procedure: TRANSESOPHAGEAL ECHOCARDIOGRAM (TEE);  Surgeon: Gaye Pollack, MD;  Location: [place];  Service: Open Heart Surgery;  Laterality: N/A;  . TEE WITHOUT CARDIOVERSION N/A 09/21/2018   Procedure: TRANSESOPHAGEAL ECHOCARDIOGRAM (TEE);  Surgeon: [name], MD;  Location: [place];  Service: Open Heart Surgery;  Laterality: N/A;  . TONSILLECTOMY  child  .  varicocele surgery  Right 1983 aaprx    Social History   Socioeconomic History  . Marital status: Divorced    Spouse name: Not on file  . Number of children: 2  . Years of education: Not on file  . Highest education level: Not on file  Occupational History  . Not on file  Tobacco Use  . Smoking status: Former Smoker    Packs/day: 1.00    Years: 20.00    Pack years: 20.00    Types: Cigarettes    Quit date: 02/15/1983    Years since quitting: 37.1  . Smokeless tobacco: Never Used  Vaping Use  . Vaping Use: Never used  Substance and Sexual Activity  . Alcohol use: Not Currently    Alcohol/week: 0.0 standard drinks    Comment: rarely   . Drug use: Never  . Sexual activity: Not on file  Other Topics Concern  . Not on file  Social History Narrative  . Not on file   Social Determinants of Health   Financial Resource Strain: Not on file  Food Insecurity: Not on file  Transportation Needs: Not on file  Physical Activity: Not on file  Stress: Not on file  Social Connections:  Not on file  Intimate Partner Violence: Not on file    Family History  Problem Relation Age of Onset  . Emphysema Father   . Prostate cancer Father   . Breast cancer Mother     ROS: no fevers or chills, productive cough, hemoptysis, dysphasia, odynophagia, melena, hematochezia, dysuria, hematuria, rash, seizure activity, orthopnea, PND, pedal edema, claudication. Remaining systems are negative.  Physical Exam: Well-developed well-nourished in no acute distress.  Skin is warm and dry.  HEENT is normal.  Neck is supple.  Chest is clear to auscultation with normal expansion.  Cardiovascular exam is regular rate and rhythm. 1/6 systolic murmur Abdominal exam nontender or distended. No masses palpated. Extremities show no edema. neuro grossly intact   A/P  1 status post aortic valve replacement-plan to continue SBE prophylaxis.  2 previous aortic root replacement secondary to aortic  aneurysm/aortic dissection-most recent CTA showed stable repair.  3 coronary artery disease-patient denies chest pain.  Plan to continue medical therapy with statin. Resume ASA 81 mg daily.  4 hypertension-blood pressure controlled.  Continue present medical regimen.  Potassium and renal function monitored by primary care.  5 hyperlipidemia-continue statin.  Lipids and liver monitored by primary care.  [name], MD

## 2020-04-15 ENCOUNTER — Other Ambulatory Visit: Payer: Self-pay

## 2020-04-15 ENCOUNTER — Encounter: Payer: Self-pay | Admitting: Cardiology

## 2020-04-15 ENCOUNTER — Ambulatory Visit: Payer: BC Managed Care – PPO | Admitting: Cardiology

## 2020-04-15 VITALS — BP 134/82 | HR 78 | Ht 70.0 in | Wt 211.0 lb

## 2020-04-15 DIAGNOSIS — E7800 Pure hypercholesterolemia, unspecified: Secondary | ICD-10-CM | POA: Diagnosis not present

## 2020-04-15 DIAGNOSIS — I10 Essential (primary) hypertension: Secondary | ICD-10-CM

## 2020-04-15 DIAGNOSIS — I2510 Atherosclerotic heart disease of native coronary artery without angina pectoris: Secondary | ICD-10-CM | POA: Diagnosis not present

## 2020-04-15 DIAGNOSIS — Z952 Presence of prosthetic heart valve: Secondary | ICD-10-CM | POA: Diagnosis not present

## 2020-04-15 DIAGNOSIS — I712 Thoracic aortic aneurysm, without rupture, unspecified: Secondary | ICD-10-CM

## 2020-04-15 NOTE — Patient Instructions (Signed)

## 2020-09-05 ENCOUNTER — Encounter: Payer: Self-pay | Admitting: *Deleted

## 2020-09-06 ENCOUNTER — Other Ambulatory Visit: Payer: Self-pay | Admitting: *Deleted

## 2020-09-06 ENCOUNTER — Encounter: Payer: Self-pay | Admitting: *Deleted

## 2020-09-06 DIAGNOSIS — I712 Thoracic aortic aneurysm, without rupture, unspecified: Secondary | ICD-10-CM

## 2020-09-18 LAB — BASIC METABOLIC PANEL
BUN/Creatinine Ratio: 14 (ref 10–24)
BUN: 16 mg/dL (ref 8–27)
CO2: 23 mmol/L (ref 20–29)
Calcium: 9.5 mg/dL (ref 8.6–10.2)
Chloride: 104 mmol/L (ref 96–106)
Creatinine, Ser: 1.16 mg/dL (ref 0.76–1.27)
Glucose: 93 mg/dL (ref 65–99)
Potassium: 4.7 mmol/L (ref 3.5–5.2)
Sodium: 144 mmol/L (ref 134–144)
eGFR: 67 mL/min/{1.73_m2} (ref >59)

## 2020-09-23 ENCOUNTER — Encounter: Payer: Self-pay | Admitting: *Deleted

## 2020-10-01 ENCOUNTER — Other Ambulatory Visit: Payer: Self-pay | Admitting: Cardiology

## 2020-10-01 DIAGNOSIS — I10 Essential (primary) hypertension: Secondary | ICD-10-CM

## 2020-10-03 ENCOUNTER — Ambulatory Visit
Admission: RE | Admit: 2020-10-03 | Discharge: 2020-10-03 | Disposition: A | Discharge status: Home / Self Care | Payer: Medicare Other | Source: Ambulatory Visit | Attending: Cardiology | Admitting: Cardiology

## 2020-10-03 ENCOUNTER — Other Ambulatory Visit: Payer: Self-pay

## 2020-10-03 ENCOUNTER — Telehealth: Payer: Self-pay | Admitting: Cardiology

## 2020-10-03 DIAGNOSIS — I712 Thoracic aortic aneurysm, without rupture, unspecified: Secondary | ICD-10-CM

## 2020-10-03 MED ORDER — IOHEXOL 350 MG/ML SOLN
75.0000 mL | Freq: Once | INTRAVENOUS | Status: AC | PRN
  Administered 2020-10-03: 75 mL via INTRAVENOUS

## 2020-10-03 NOTE — Telephone Encounter (Signed)
Urgent radiology report called in by Linus Orn at [facility] with the following findings:  "2. Previous venous bypass graft to the obtuse marginal was noted to be well opacified on previous examinations performed 09/2019 and 09/2018 however does NOT appear opacified on the present examination, potentially technique related though bypass graft thrombosis is not excluded. Clinical correlation is advised. Further evaluation with cardiac catheterization could be performed as indicated. 3. Stable uncomplicated mild fusiform aneurysmal dilatation of the junction of the ascending thoracic aorta and aortic arch measuring approximately 42 mm. 4. Progression of tree-in-bud opacities primarily within the right middle lobe and lingula have progressed since the 09/2019 examination, findings most suggestive of progressive chronic atypical infection such as MAI."  Will send to MD to review.

## 2020-10-03 NOTE — Telephone Encounter (Signed)
[facility] calling with CT Results for this patient

## 2020-10-04 NOTE — Telephone Encounter (Signed)
Repeat CTA in 1 year.  Have patient follow-up with primary care for possible chronic lung infection.  [name], MD    Results released to my chart  Results forwarded

## 2020-12-18 ENCOUNTER — Other Ambulatory Visit (HOSPITAL_BASED_OUTPATIENT_CLINIC_OR_DEPARTMENT_OTHER): Payer: Self-pay | Admitting: Surgery

## 2020-12-18 ENCOUNTER — Other Ambulatory Visit: Payer: Self-pay | Admitting: Surgery

## 2020-12-18 DIAGNOSIS — M7582 Other shoulder lesions, left shoulder: Secondary | ICD-10-CM

## 2020-12-18 DIAGNOSIS — M19112 Post-traumatic osteoarthritis, left shoulder: Secondary | ICD-10-CM

## 2021-01-15 ENCOUNTER — Ambulatory Visit: Payer: BC Managed Care – PPO

## 2021-01-20 ENCOUNTER — Other Ambulatory Visit: Payer: Self-pay | Admitting: Cardiology

## 2021-01-20 DIAGNOSIS — I10 Essential (primary) hypertension: Secondary | ICD-10-CM

## 2021-02-07 ENCOUNTER — Other Ambulatory Visit: Payer: Self-pay | Admitting: Surgery

## 2021-02-09 DIAGNOSIS — Z8616 Personal history of COVID-19: Secondary | ICD-10-CM

## 2021-02-09 HISTORY — DX: Personal history of COVID-19: Z86.16

## 2021-02-12 ENCOUNTER — Other Ambulatory Visit: Payer: Self-pay

## 2021-02-12 ENCOUNTER — Ambulatory Visit
Admission: RE | Admit: 2021-02-12 | Discharge: 2021-02-12 | Disposition: A | Discharge status: Home / Self Care | Payer: BC Managed Care – PPO | Source: Ambulatory Visit | Attending: Surgery | Admitting: Surgery

## 2021-02-12 ENCOUNTER — Ambulatory Visit: Payer: BC Managed Care – PPO

## 2021-02-12 DIAGNOSIS — M19112 Post-traumatic osteoarthritis, left shoulder: Secondary | ICD-10-CM | POA: Insufficient documentation

## 2021-02-12 DIAGNOSIS — M7582 Other shoulder lesions, left shoulder: Secondary | ICD-10-CM | POA: Insufficient documentation

## 2021-02-25 ENCOUNTER — Other Ambulatory Visit: Payer: Self-pay

## 2021-02-25 ENCOUNTER — Other Ambulatory Visit
Admission: RE | Admit: 2021-02-25 | Discharge: 2021-02-25 | Disposition: A | Discharge status: Home / Self Care | Payer: BC Managed Care – PPO | Source: Ambulatory Visit | Attending: Surgery | Admitting: Surgery

## 2021-02-25 DIAGNOSIS — Z01818 Encounter for other preprocedural examination: Secondary | ICD-10-CM | POA: Diagnosis not present

## 2021-02-25 HISTORY — DX: Pneumonia, unspecified organism: J18.9

## 2021-02-25 LAB — CBC WITH DIFFERENTIAL/PLATELET
Abs Immature Granulocytes: 0.03 10*3/uL (ref 0.00–0.07)
Basophils Absolute: 0 10*3/uL (ref 0.0–0.1)
Basophils Relative: 1 %
Eosinophils Absolute: 0.1 10*3/uL (ref 0.0–0.5)
Eosinophils Relative: 2 %
HCT: 42.2 % (ref 39.0–52.0)
Hemoglobin: 14 g/dL (ref 13.0–17.0)
Immature Granulocytes: 0 %
Lymphocytes Relative: 13 %
Lymphs Abs: 1 10*3/uL (ref 0.7–4.0)
MCH: 32.5 pg (ref 26.0–34.0)
MCHC: 33.2 g/dL (ref 30.0–36.0)
MCV: 97.9 fL (ref 80.0–100.0)
Monocytes Absolute: 0.8 10*3/uL (ref 0.1–1.0)
Monocytes Relative: 10 %
Neutro Abs: 5.7 10*3/uL (ref 1.7–7.7)
Neutrophils Relative %: 74 %
Platelets: 202 10*3/uL (ref 150–400)
RBC: 4.31 MIL/uL (ref 4.22–5.81)
RDW: 13.7 % (ref 11.5–15.5)
WBC: 7.7 10*3/uL (ref 4.0–10.5)
nRBC: 0 % (ref 0.0–0.2)

## 2021-02-25 LAB — URINALYSIS, ROUTINE W REFLEX MICROSCOPIC
Bilirubin Urine: NEGATIVE
Glucose, UA: NEGATIVE mg/dL
Hgb urine dipstick: NEGATIVE
Ketones, ur: NEGATIVE mg/dL
Leukocytes,Ua: NEGATIVE
Nitrite: NEGATIVE
Protein, ur: NEGATIVE mg/dL
Specific Gravity, Urine: 1.008 (ref 1.005–1.030)
pH: 6 (ref 5.0–8.0)

## 2021-02-25 LAB — SURGICAL PCR SCREEN
MRSA, PCR: NEGATIVE
Staphylococcus aureus: NEGATIVE

## 2021-02-25 LAB — COMPREHENSIVE METABOLIC PANEL
ALT: 20 U/L (ref 0–44)
AST: 19 U/L (ref 15–41)
Albumin: 4.1 g/dL (ref 3.5–5.0)
Alkaline Phosphatase: 81 U/L (ref 38–126)
Anion gap: 6 (ref 5–15)
BUN: 18 mg/dL (ref 8–23)
CO2: 29 mmol/L (ref 22–32)
Calcium: 9.3 mg/dL (ref 8.9–10.3)
Chloride: 104 mmol/L (ref 98–111)
Creatinine, Ser: 1.2 mg/dL (ref 0.61–1.24)
GFR, Estimated: >60 mL/min (ref >60)
Glucose, Bld: 106 mg/dL — ABNORMAL HIGH (ref 70–99)
Potassium: 4 mmol/L (ref 3.5–5.1)
Sodium: 139 mmol/L (ref 135–145)
Total Bilirubin: 0.5 mg/dL (ref 0.3–1.2)
Total Protein: 7 g/dL (ref 6.5–8.1)

## 2021-02-25 LAB — TYPE AND SCREEN
ABO/RH(D): O POS
Antibody Screen: NEGATIVE

## 2021-02-25 NOTE — Patient Instructions (Signed)
Your procedure is scheduled on: Thurs 1/26 Report to Day Surgery. Stop by registration desk first To find out your arrival time please call [phone] between 1PM - 3PM on  Wed. 1/25.  Remember: Instructions that are not followed completely may result in serious medical risk,  up to and including death, or upon the discretion of your surgeon and anesthesiologist your  surgery may need to be rescheduled.     _X__ 1. Do not eat food after midnight the night before your procedure.                 No chewing gum or hard candies. You may drink clear liquids up to 2 hours                 before you are scheduled to arrive for your surgery- DO not drink clear                 liquids within 2 hours of the start of your surgery.                 Clear Liquids include:  water, apple juice without pulp, clear Gatorade, G2 or                  Gatorade Zero (avoid Red/Purple/Blue), Black Coffee or Tea (Do not add                 anything to coffee or tea). __x___2.   Complete the "Ensure Clear Pre-surgery Clear Carbohydrate Drink" provided to you, 2 hours before arrival. **If you are diabetic you will be  provided with an alternative drink, Gatorade Zero or G2.  __X__2.  On the morning of surgery brush your teeth with toothpaste and water, you                may rinse your mouth with mouthwash if you wish.  Do not swallow any toothpaste of mouthwash.     _X__ 3.  No Alcohol for 24 hours before or after surgery.   ___ 4.  Do Not Smoke or use e-cigarettes For 24 Hours Prior to Your Surgery.                 Do not use any chewable tobacco products for at least 6 hours prior to                 Surgery.  ___  5.  Do not use any recreational drugs (marijuana, cocaine, heroin, ecstasy,       MDMA or other) For at least one week prior to your surgery.            Combination of these drugs with anesthesia may have life threatening       results.  ____  6.  Bring all medications with  you on the day of surgery if instructed.   __x__  7.  Notify your doctor if there is any change in your medical condition      (cold, fever, infections).     Do not wear jewelry,Do not wear lotions, powders, or perfumes. You may wear deodorant. Do not shave 48 hours prior to surgery. Men may shave face and neck. Do not bring valuables to the hospital.    [facility] [address] is not responsible for any belongings or valuables.  Contacts, dentures or bridgework may not be worn into surgery. Leave your suitcase in the car. After surgery it may be brought to your room. For  patients admitted to the hospital, discharge time is determined by your treatment team.   Patients discharged the day of surgery will not be allowed to drive home.   Make arrangements for someone to be with you for the first 24 hours of your Same Day Discharge.    Please read over the following fact sheets that you were given:       _x___ Take these medicines the morning of surgery with A SIP OF WATER:    1acetaminophen (TYLENOL) 500 MG tablet  2. levothyroxine (SYNTHROID) 75 MCG tablet  3. metoprolol tartrate (LOPRESSOR) 25 MG tablet  4.  5.  6.  ____ Fleet Enema (as directed)   __x__ Use CHG Soap (or wipes) as directed  _x___ Use Benzoyl Peroxide Gel as instructed  ____ Use inhalers on the day of surgery  ____ Stop metformin 2 days prior to surgery    ____ Take 1/2 of usual insulin dose the night before surgery. No insulin the morning          of surgery.   ___x_ Stop aspirin on Last dose on 1/20  ___x_ Stop Anti-inflammatories on  1/19   __x__ Stop supplements until after surgery.  Multiple Vitamin (MULTIVITAMIN WITH MINERALS) TABS tablet,zinc gluconate 50 MG tablet  ____ Bring C-Pap to the hospital.    If you have any questions regarding your pre-procedure instructions,  Please call Pre-admit Testing at [phone]

## 2021-03-04 ENCOUNTER — Other Ambulatory Visit: Payer: Self-pay

## 2021-03-04 ENCOUNTER — Other Ambulatory Visit
Admission: RE | Admit: 2021-03-04 | Discharge: 2021-03-04 | Disposition: A | Discharge status: Home / Self Care | Payer: BC Managed Care – PPO | Source: Ambulatory Visit | Attending: Surgery | Admitting: Surgery

## 2021-03-04 DIAGNOSIS — Z01812 Encounter for preprocedural laboratory examination: Secondary | ICD-10-CM | POA: Diagnosis present

## 2021-03-04 DIAGNOSIS — Z20822 Contact with and (suspected) exposure to covid-19: Secondary | ICD-10-CM | POA: Insufficient documentation

## 2021-03-04 LAB — SARS CORONAVIRUS 2 (TAT 6-24 HRS): SARS Coronavirus 2: NEGATIVE

## 2021-03-06 ENCOUNTER — Other Ambulatory Visit: Payer: Self-pay

## 2021-03-06 ENCOUNTER — Encounter
Admission: RE | Disposition: A | Discharge status: Home / Self Care | Payer: Self-pay | Source: Home / Self Care | Attending: Surgery

## 2021-03-06 ENCOUNTER — Ambulatory Visit: Payer: BC Managed Care – PPO | Admitting: Urgent Care

## 2021-03-06 ENCOUNTER — Ambulatory Visit
Admission: RE | Admit: 2021-03-06 | Discharge: 2021-03-06 | Disposition: A | Discharge status: Home / Self Care | Payer: BC Managed Care – PPO | Attending: Surgery | Admitting: Surgery

## 2021-03-06 ENCOUNTER — Ambulatory Visit: Payer: BC Managed Care – PPO

## 2021-03-06 ENCOUNTER — Ambulatory Visit: Payer: BC Managed Care – PPO | Admitting: Anesthesiology

## 2021-03-06 ENCOUNTER — Encounter: Payer: Self-pay | Admitting: Surgery

## 2021-03-06 DIAGNOSIS — Z419 Encounter for procedure for purposes other than remedying health state, unspecified: Secondary | ICD-10-CM

## 2021-03-06 DIAGNOSIS — I2510 Atherosclerotic heart disease of native coronary artery without angina pectoris: Secondary | ICD-10-CM | POA: Insufficient documentation

## 2021-03-06 DIAGNOSIS — Z951 Presence of aortocoronary bypass graft: Secondary | ICD-10-CM | POA: Insufficient documentation

## 2021-03-06 DIAGNOSIS — M7522 Bicipital tendinitis, left shoulder: Secondary | ICD-10-CM | POA: Diagnosis not present

## 2021-03-06 DIAGNOSIS — Z7982 Long term (current) use of aspirin: Secondary | ICD-10-CM | POA: Diagnosis not present

## 2021-03-06 DIAGNOSIS — M19012 Primary osteoarthritis, left shoulder: Secondary | ICD-10-CM | POA: Diagnosis not present

## 2021-03-06 DIAGNOSIS — Z96612 Presence of left artificial shoulder joint: Secondary | ICD-10-CM

## 2021-03-06 DIAGNOSIS — E039 Hypothyroidism, unspecified: Secondary | ICD-10-CM | POA: Diagnosis not present

## 2021-03-06 DIAGNOSIS — Z952 Presence of prosthetic heart valve: Secondary | ICD-10-CM | POA: Diagnosis not present

## 2021-03-06 DIAGNOSIS — I10 Essential (primary) hypertension: Secondary | ICD-10-CM | POA: Diagnosis not present

## 2021-03-06 HISTORY — PX: REVERSE SHOULDER ARTHROPLASTY: SHX5054

## 2021-03-06 SURGERY — ARTHROPLASTY, SHOULDER, TOTAL, REVERSE
Anesthesia: Regional | Site: Shoulder | Laterality: Left

## 2021-03-06 MED ORDER — LIDOCAINE HCL (CARDIAC) PF 100 MG/5ML IV SOSY
PREFILLED_SYRINGE | INTRAVENOUS | Status: DC | PRN
  Administered 2021-03-06: 100 mg via INTRAVENOUS

## 2021-03-06 MED ORDER — CHLORHEXIDINE GLUCONATE 0.12 % MT SOLN: 15.0000 mL | Freq: Once | OROMUCOSAL | Status: AC

## 2021-03-06 MED ORDER — EPHEDRINE SULFATE (PRESSORS) 50 MG/ML IJ SOLN
INTRAMUSCULAR | Status: DC | PRN
  Administered 2021-03-06 (×2): 5 mg via INTRAVENOUS

## 2021-03-06 MED ORDER — BUPIVACAINE LIPOSOME 1.3 % IJ SUSP
INTRAMUSCULAR | Status: AC
  Filled 2021-03-06: qty 10

## 2021-03-06 MED ORDER — FENTANYL CITRATE (PF) 100 MCG/2ML IJ SOLN
INTRAMUSCULAR | Status: AC
  Filled 2021-03-06: qty 2

## 2021-03-06 MED ORDER — ONDANSETRON HCL 4 MG/2ML IJ SOLN
INTRAMUSCULAR | Status: AC
  Filled 2021-03-06: qty 2

## 2021-03-06 MED ORDER — BUPIVACAINE-EPINEPHRINE (PF) 0.5% -1:200000 IJ SOLN
INTRAMUSCULAR | Status: AC
  Filled 2021-03-06: qty 30

## 2021-03-06 MED ORDER — MIDAZOLAM HCL 2 MG/2ML IJ SOLN: 2.0000 mg | Freq: Once | INTRAMUSCULAR | Status: AC

## 2021-03-06 MED ORDER — PROPOFOL 10 MG/ML IV BOLUS
INTRAVENOUS | Status: DC | PRN
Start: 2021-03-06 — End: 2021-03-06
  Administered 2021-03-06: 150 mg via INTRAVENOUS
  Administered 2021-03-06: 50 mg via INTRAVENOUS

## 2021-03-06 MED ORDER — 0.9 % SODIUM CHLORIDE (POUR BTL) OPTIME
TOPICAL | Status: DC | PRN
  Administered 2021-03-06: 500 mL

## 2021-03-06 MED ORDER — BUPIVACAINE LIPOSOME 1.3 % IJ SUSP
INTRAMUSCULAR | Status: DC | PRN
  Administered 2021-03-06: 10 mL via PERINEURAL

## 2021-03-06 MED ORDER — CHLORHEXIDINE GLUCONATE 0.12 % MT SOLN
OROMUCOSAL | Status: AC
  Administered 2021-03-06: 15 mL via OROMUCOSAL
  Filled 2021-03-06: qty 15

## 2021-03-06 MED ORDER — CEFAZOLIN SODIUM-DEXTROSE 2-4 GM/100ML-% IV SOLN: 2.0000 g | Freq: Four times a day (QID) | INTRAVENOUS | Status: DC

## 2021-03-06 MED ORDER — HYDROCODONE-ACETAMINOPHEN 5-325 MG PO TABS: 1.0000 | ORAL_TABLET | Freq: Four times a day (QID) | ORAL | 0 refills | Status: DC | PRN

## 2021-03-06 MED ORDER — PHENYLEPHRINE 40 MCG/ML (10ML) SYRINGE FOR IV PUSH (FOR BLOOD PRESSURE SUPPORT)
PREFILLED_SYRINGE | INTRAVENOUS | Status: DC | PRN
  Administered 2021-03-06: 80 ug via INTRAVENOUS

## 2021-03-06 MED ORDER — DEXAMETHASONE SODIUM PHOSPHATE 10 MG/ML IJ SOLN
INTRAMUSCULAR | Status: AC
  Filled 2021-03-06: qty 1

## 2021-03-06 MED ORDER — MIDAZOLAM HCL 2 MG/2ML IJ SOLN
INTRAMUSCULAR | Status: AC
  Administered 2021-03-06: 2 mg via INTRAVENOUS
  Filled 2021-03-06: qty 2

## 2021-03-06 MED ORDER — BUPIVACAINE HCL (PF) 0.5 % IJ SOLN
INTRAMUSCULAR | Status: DC | PRN
  Administered 2021-03-06: 10 mL via PERINEURAL

## 2021-03-06 MED ORDER — BUPIVACAINE LIPOSOME 1.3 % IJ SUSP
INTRAMUSCULAR | Status: AC
  Filled 2021-03-06: qty 20

## 2021-03-06 MED ORDER — OXYCODONE HCL 5 MG/5ML PO SOLN: 5.0000 mg | Freq: Once | ORAL | Status: DC | PRN

## 2021-03-06 MED ORDER — PHENYLEPHRINE HCL-NACL 20-0.9 MG/250ML-% IV SOLN
INTRAVENOUS | Status: DC | PRN
  Administered 2021-03-06: 35 ug/min via INTRAVENOUS

## 2021-03-06 MED ORDER — BUPIVACAINE HCL (PF) 0.5 % IJ SOLN
INTRAMUSCULAR | Status: AC
  Filled 2021-03-06: qty 10

## 2021-03-06 MED ORDER — FENTANYL CITRATE PF 50 MCG/ML IJ SOSY: 50.0000 ug | PREFILLED_SYRINGE | Freq: Once | INTRAMUSCULAR | Status: DC

## 2021-03-06 MED ORDER — FENTANYL CITRATE (PF) 100 MCG/2ML IJ SOLN: 25.0000 ug | INTRAMUSCULAR | Status: DC | PRN

## 2021-03-06 MED ORDER — LACTATED RINGERS IV SOLN: INTRAVENOUS | Status: DC

## 2021-03-06 MED ORDER — FAMOTIDINE 20 MG PO TABS: 20.0000 mg | ORAL_TABLET | Freq: Once | ORAL | Status: AC

## 2021-03-06 MED ORDER — MIDAZOLAM HCL 2 MG/2ML IJ SOLN: 1.0000 mg | Freq: Once | INTRAMUSCULAR | Status: DC

## 2021-03-06 MED ORDER — KETOROLAC TROMETHAMINE 15 MG/ML IJ SOLN
INTRAMUSCULAR | Status: AC
  Filled 2021-03-06: qty 1

## 2021-03-06 MED ORDER — ACETAMINOPHEN 10 MG/ML IV SOLN: 1000.0000 mg | Freq: Once | INTRAVENOUS | Status: DC | PRN

## 2021-03-06 MED ORDER — ROCURONIUM BROMIDE 10 MG/ML (PF) SYRINGE
PREFILLED_SYRINGE | INTRAVENOUS | Status: AC
  Filled 2021-03-06: qty 10

## 2021-03-06 MED ORDER — DEXAMETHASONE SODIUM PHOSPHATE 10 MG/ML IJ SOLN
INTRAMUSCULAR | Status: DC | PRN
  Administered 2021-03-06: 10 mg via INTRAVENOUS

## 2021-03-06 MED ORDER — ONDANSETRON HCL 4 MG PO TABS: 4.0000 mg | ORAL_TABLET | Freq: Four times a day (QID) | ORAL | Status: DC | PRN

## 2021-03-06 MED ORDER — ONDANSETRON HCL 4 MG/2ML IJ SOLN: 4.0000 mg | Freq: Four times a day (QID) | INTRAMUSCULAR | Status: DC | PRN

## 2021-03-06 MED ORDER — CEFAZOLIN SODIUM-DEXTROSE 2-4 GM/100ML-% IV SOLN
2.0000 g | INTRAVENOUS | Status: AC
  Administered 2021-03-06: 2 g via INTRAVENOUS

## 2021-03-06 MED ORDER — BUPIVACAINE-EPINEPHRINE (PF) 0.5% -1:200000 IJ SOLN
INTRAMUSCULAR | Status: DC | PRN
  Administered 2021-03-06: 40 mL

## 2021-03-06 MED ORDER — HYDROCODONE-ACETAMINOPHEN 5-325 MG PO TABS: 1.0000 | ORAL_TABLET | ORAL | Status: DC | PRN

## 2021-03-06 MED ORDER — FAMOTIDINE 20 MG PO TABS
ORAL_TABLET | ORAL | Status: AC
  Administered 2021-03-06: 20 mg via ORAL
  Filled 2021-03-06: qty 1

## 2021-03-06 MED ORDER — METOCLOPRAMIDE HCL 10 MG PO TABS: 5.0000 mg | ORAL_TABLET | Freq: Three times a day (TID) | ORAL | Status: DC | PRN

## 2021-03-06 MED ORDER — SODIUM CHLORIDE FLUSH 0.9 % IV SOLN
INTRAVENOUS | Status: AC
  Filled 2021-03-06: qty 40

## 2021-03-06 MED ORDER — SODIUM CHLORIDE 0.9 % IR SOLN
Status: DC | PRN
  Administered 2021-03-06: 3000 mL

## 2021-03-06 MED ORDER — CEFAZOLIN SODIUM-DEXTROSE 2-4 GM/100ML-% IV SOLN
INTRAVENOUS | Status: AC
  Filled 2021-03-06: qty 100

## 2021-03-06 MED ORDER — SUGAMMADEX SODIUM 200 MG/2ML IV SOLN
INTRAVENOUS | Status: DC | PRN
  Administered 2021-03-06: 200 mg via INTRAVENOUS

## 2021-03-06 MED ORDER — METOCLOPRAMIDE HCL 5 MG/ML IJ SOLN: 5.0000 mg | Freq: Three times a day (TID) | INTRAMUSCULAR | Status: DC | PRN

## 2021-03-06 MED ORDER — TRANEXAMIC ACID 1000 MG/10ML IV SOLN
INTRAVENOUS | Status: AC
  Filled 2021-03-06: qty 10

## 2021-03-06 MED ORDER — FENTANYL CITRATE PF 50 MCG/ML IJ SOSY
PREFILLED_SYRINGE | INTRAMUSCULAR | Status: AC
  Filled 2021-03-06: qty 1

## 2021-03-06 MED ORDER — ACETAMINOPHEN 10 MG/ML IV SOLN
INTRAVENOUS | Status: AC
  Filled 2021-03-06: qty 100

## 2021-03-06 MED ORDER — ROCURONIUM BROMIDE 100 MG/10ML IV SOLN
INTRAVENOUS | Status: DC | PRN
  Administered 2021-03-06 (×2): 10 mg via INTRAVENOUS
  Administered 2021-03-06: 50 mg via INTRAVENOUS

## 2021-03-06 MED ORDER — FENTANYL CITRATE (PF) 100 MCG/2ML IJ SOLN
INTRAMUSCULAR | Status: DC | PRN
  Administered 2021-03-06 (×3): 50 ug via INTRAVENOUS
  Administered 2021-03-06 (×2): 25 ug via INTRAVENOUS

## 2021-03-06 MED ORDER — ACETAMINOPHEN 10 MG/ML IV SOLN
INTRAVENOUS | Status: DC | PRN
  Administered 2021-03-06: 1000 mg via INTRAVENOUS

## 2021-03-06 MED ORDER — ONDANSETRON HCL 4 MG/2ML IJ SOLN
INTRAMUSCULAR | Status: DC | PRN
Start: 2021-03-06 — End: 2021-03-06
  Administered 2021-03-06: 4 mg via INTRAVENOUS

## 2021-03-06 MED ORDER — KETOROLAC TROMETHAMINE 15 MG/ML IJ SOLN
15.0000 mg | Freq: Once | INTRAMUSCULAR | Status: AC
  Administered 2021-03-06: 15 mg via INTRAVENOUS

## 2021-03-06 MED ORDER — ONDANSETRON HCL 4 MG/2ML IJ SOLN: 4.0000 mg | Freq: Once | INTRAMUSCULAR | Status: DC | PRN

## 2021-03-06 MED ORDER — CEFAZOLIN SODIUM-DEXTROSE 2-4 GM/100ML-% IV SOLN
INTRAVENOUS | Status: AC
  Administered 2021-03-06: 2 g via INTRAVENOUS
  Filled 2021-03-06: qty 100

## 2021-03-06 MED ORDER — LIDOCAINE HCL (PF) 2 % IJ SOLN
INTRAMUSCULAR | Status: AC
  Filled 2021-03-06: qty 5

## 2021-03-06 MED ORDER — TRANEXAMIC ACID 1000 MG/10ML IV SOLN
INTRAVENOUS | Status: DC | PRN
  Administered 2021-03-06: 1000 mg via TOPICAL

## 2021-03-06 MED ORDER — OXYCODONE HCL 5 MG PO TABS: 5.0000 mg | ORAL_TABLET | Freq: Once | ORAL | Status: DC | PRN

## 2021-03-06 MED ORDER — SODIUM CHLORIDE 0.9 % IV SOLN: INTRAVENOUS | Status: DC

## 2021-03-06 MED ORDER — STERILE WATER FOR IRRIGATION IR SOLN
Status: DC | PRN
Start: 2021-03-06 — End: 2021-03-06
  Administered 2021-03-06: 1000 mL

## 2021-03-06 MED ORDER — ORAL CARE MOUTH RINSE: 15.0000 mL | Freq: Once | OROMUCOSAL | Status: AC

## 2021-03-06 MED ORDER — PROPOFOL 10 MG/ML IV BOLUS
INTRAVENOUS | Status: AC
  Filled 2021-03-06: qty 20

## 2021-03-06 SURGICAL SUPPLY — 69 items
APL PRP STRL LF DISP 70% ISPRP (MISCELLANEOUS) ×1
BASEPLATE AUG FULL 24X20 +2 (Plate) ×1 IMPLANT
BIT DRILL FLUTED 3.0 STRL (BIT) ×1 IMPLANT
BLADE SAW SAG 25X90X1.19 (BLADE) ×2 IMPLANT
CHLORAPREP W/TINT 26 (MISCELLANEOUS) ×2 IMPLANT
COOLER POLAR GLACIER W/PUMP (MISCELLANEOUS) ×2 IMPLANT
COVER BACK TABLE REUSABLE LG (DRAPES) ×2 IMPLANT
CUP SUT UNIV REVERS 39 NEU (Shoulder) ×1 IMPLANT
DRAPE 3/4 80X56 (DRAPES) ×4 IMPLANT
DRAPE IMP U-DRAPE 54X76 (DRAPES) ×4 IMPLANT
DRAPE INCISE IOBAN 66X45 STRL (DRAPES) ×4 IMPLANT
DRSG OPSITE POSTOP 4X8 (GAUZE/BANDAGES/DRESSINGS) ×2 IMPLANT
ELECT BLADE 6.5 EXT (BLADE) IMPLANT
ELECT CAUTERY BLADE 6.4 (BLADE) ×2 IMPLANT
ELECT REM PT RETURN 9FT ADLT (ELECTROSURGICAL) ×2
ELECTRODE REM PT RTRN 9FT ADLT (ELECTROSURGICAL) ×1 IMPLANT
GAUZE XEROFORM 1X8 LF (GAUZE/BANDAGES/DRESSINGS) ×2 IMPLANT
GLENOSPHERE 39+4 LAT/24 UNI RV (Joint) ×1 IMPLANT
GLOVE SRG 8 PF TXTR STRL LF DI (GLOVE) ×2 IMPLANT
GLOVE SURG ENC MOIS LTX SZ7.5 (GLOVE) ×8 IMPLANT
GLOVE SURG ENC MOIS LTX SZ8 (GLOVE) ×8 IMPLANT
GLOVE SURG UNDER LTX SZ8 (GLOVE) ×2 IMPLANT
GLOVE SURG UNDER POLY LF SZ8 (GLOVE) ×4
GOWN STRL REUS W/ TWL LRG LVL3 (GOWN DISPOSABLE) ×1 IMPLANT
GOWN STRL REUS W/ TWL XL LVL3 (GOWN DISPOSABLE) ×1 IMPLANT
GOWN STRL REUS W/TWL LRG LVL3 (GOWN DISPOSABLE) ×2
GOWN STRL REUS W/TWL XL LVL3 (GOWN DISPOSABLE) ×2
INSERT HUMERAL MED 39/ +3 (Shoulder) IMPLANT
INSERT MEDIUM HUMERAL 39/ +3 (Shoulder) ×2 IMPLANT
IV NS IRRIG 3000ML ARTHROMATIC (IV SOLUTION) ×2 IMPLANT
KIT STABILIZATION SHOULDER (MISCELLANEOUS) ×2 IMPLANT
KIT TURNOVER KIT A (KITS) ×2 IMPLANT
MANIFOLD NEPTUNE II (INSTRUMENTS) ×2 IMPLANT
MASK FACE SPIDER DISP (MASK) ×2 IMPLANT
MAT ABSORB  FLUID 56X50 GRAY (MISCELLANEOUS) ×2
MAT ABSORB FLUID 56X50 GRAY (MISCELLANEOUS) ×1 IMPLANT
NDL SAFETY ECLIPSE 18X1.5 (NEEDLE) ×1 IMPLANT
NDL SPNL 20GX3.5 QUINCKE YW (NEEDLE) ×1 IMPLANT
NEEDLE HYPO 18GX1.5 SHARP (NEEDLE) ×2
NEEDLE SPNL 20GX3.5 QUINCKE YW (NEEDLE) ×2 IMPLANT
NS IRRIG 500ML POUR BTL (IV SOLUTION) ×2 IMPLANT
PACK ARTHROSCOPY SHOULDER (MISCELLANEOUS) ×2 IMPLANT
PAD ARMBOARD 7.5X6 YLW CONV (MISCELLANEOUS) ×2 IMPLANT
PAD WRAPON POLAR SHDR UNIV (MISCELLANEOUS) ×1 IMPLANT
PIN NITINOL TARGETER 2.8 (PIN) ×2 IMPLANT
POST MODULAR 25 (Post) ×2 IMPLANT
POST MODULAR MGS BASEPLATE 25 (Post) IMPLANT
PULSAVAC PLUS IRRIG FAN TIP (DISPOSABLE) ×2
REAMER ANGLED HEAD SMALL (DRILL) ×1 IMPLANT
SCREW PERI LOCK 5.5X32 (Screw) ×1 IMPLANT
SCREW PERI NL 4.5X32 (Screw) ×1 IMPLANT
SCREW PERIPHERAL 5.5X20 LOCK (Screw) ×2 IMPLANT
SLING ULTRA II M (MISCELLANEOUS) IMPLANT
SPONGE T-LAP 18X18 ~~LOC~~+RFID (SPONGE) ×4 IMPLANT
STAPLER SKIN PROX 35W (STAPLE) ×2 IMPLANT
STEM HUM UNIV REV SZ 12 (Stem) ×1 IMPLANT
SUT ETHIBOND 0 MO6 C/R (SUTURE) ×2 IMPLANT
SUT FIBERWIRE #2 38 BLUE 1/2 (SUTURE) ×4
SUT VIC AB 0 CT1 36 (SUTURE) ×2 IMPLANT
SUT VIC AB 2-0 CT1 27 (SUTURE) ×4
SUT VIC AB 2-0 CT1 TAPERPNT 27 (SUTURE) ×2 IMPLANT
SUTURE FIBERWR #2 38 BLUE 1/2 (SUTURE) ×4 IMPLANT
SYR 10ML LL (SYRINGE) ×2 IMPLANT
SYR 30ML LL (SYRINGE) ×2 IMPLANT
TIP FAN IRRIG PULSAVAC PLUS (DISPOSABLE) ×1 IMPLANT
UNIVERS REVERS MODULAR GLENOID SYSTEM, PERIPHERAL ×1 IMPLANT
WATER STERILE IRR 1000ML POUR (IV SOLUTION) ×1 IMPLANT
WATER STERILE IRR 500ML POUR (IV SOLUTION) ×1 IMPLANT
WRAPON POLAR PAD SHDR UNIV (MISCELLANEOUS) ×2

## 2021-03-06 NOTE — Anesthesia Procedure Notes (Signed)
Procedure Name: Intubation Date/Time: 03/06/2021 10:43 AM Performed by: Loletha Grayer, CRNA Pre-anesthesia Checklist: Patient identified, Patient being monitored, Timeout performed, Emergency Drugs available and Suction available Patient Re-evaluated:Patient Re-evaluated prior to induction Oxygen Delivery Method: Circle system utilized Preoxygenation: Pre-oxygenation with 100% oxygen Induction Type: IV induction Ventilation: Mask ventilation without difficulty Laryngoscope Size: Mac and 3 Grade View: Grade II Tube type: Oral Tube size: 7.0 mm Number of attempts: 1 Airway Equipment and Method: Stylet Placement Confirmation: ETT inserted through vocal cords under direct vision, positive ETCO2 and breath sounds checked- equal and bilateral Secured at: 22 cm Tube secured with: Tape Dental Injury: Injury to lip  Comments: Very small knick to upper lip noted due to initial difficulty with view, vaseline applied.

## 2021-03-06 NOTE — H&P (Signed)
History of Present Illness: Danny Odom is a 71 y.o. male who presents today for his surgical history and physical for upcoming left reverse total shoulder arthroplasty. Surgery scheduled with Dr. Roland Rack on 03/06/2021. The patient denies any trauma or injury affecting the left shoulder since his last evaluation. The patient denies any numbness or tingling to the left upper extremity. He reports a pain moderate discomfort in the left shoulder. The patient denies any personal history of DVT, asthma or COPD. He does have a history of prosthetic aortic valve replacement. He does take a daily 81 mg aspirin.  Past Medical History:  Colon polyp 03/04/49 (tubular adenoma)   Coronary artery disease August 2020   GERD (gastroesophageal reflux disease) 2018   History of prosthetic aortic valve replacement 09/12/2018 (Pericardial valve, 8/19)   Hx of migraine headaches   Hypertension   Other and unspecified hyperlipidemia   Post-traumatic osteoarthritis of left shoulder 11/18/2020   Prostate cancer (CMS-HCC) 09/25/2013 (Postop 8/15)   Thyroid disease   Tubular adenoma 06/14/1949   Past Surgical History:  COLONOSCOPY 12-Jan-1951   KNEE ARTHROSCOPY 2003   COLONOSCOPY 04/19/2019 (Diverticulosis/PHx CP/Repeat 34yrs/JWB)   OTHER SURGERY 09/2019 (open heart)   CARDIAC VALVE REPLACEMENT   CORONARY ARTERY BYPASS GRAFT   TONSILLECTOMY AND ADENOIDECTOMY   VARICOCELE EXCISION   Past Family History:  High blood pressure (Hypertension) Other (family hx)   Colon polyps Father (in his 10s)   Alcohol abuse Father   Breast cancer Mother   High blood pressure (Hypertension) Mother   Cancer Mother   Medications:  acetaminophen (TYLENOL) 500 MG tablet Take 500 mg by mouth   aspirin 81 MG EC tablet Take 81 mg by mouth daily.   atorvastatin (LIPITOR) 40 MG tablet Take 1 tablet (40 mg total) by mouth once daily 90 tablet 3   cyanocobalamin (VITAMIN B12) 500 MCG tablet Take by mouth   levothyroxine (SYNTHROID)  75 MCG tablet Take 1 tablet (75 mcg total) by mouth once daily Take on an empty stomach with a glass of water at least 30-60 minutes before breakfast. 90 tablet 3   losartan (COZAAR) 50 MG tablet   metoprolol tartrate (LOPRESSOR) 25 MG tablet Take 0.5 tablets (12.5 mg total) by mouth 2 (two) times daily 90 tablet 3   MULTIVITAMIN ORAL Take 1 tablet by mouth daily.   zinc 50 mg Tab Take 1 tablet by mouth daily.   Allergies:  Other Itching (POISON IVY)  Review of Systems:  A comprehensive 14 point ROS was performed, reviewed by me today, and the pertinent orthopaedic findings are documented in the HPI.  Physical Exam: BP 124/86 (BP Location: Left upper arm, Patient Position: Sitting, BP Cuff Size: Adult)   Ht 177.8 cm (5\' 10" )   Wt 97.7 kg (215 lb 6.4 oz)   BMI 30.91 kg/m  General/Constitutional: The patient appears to be well-nourished, well-developed, and in no acute distress. Neuro/Psych: Normal mood and affect, oriented to person, place and time. Eyes: Non-icteric. Pupils are equal, round, and reactive to light, and exhibit synchronous movement. ENT: Unremarkable. Lymphatic: No palpable adenopathy. Respiratory: Lungs clear to auscultation, Normal chest excursion, No wheezes and Non-labored breathing Cardiovascular: Regular rate and rhythm. No murmurs. and No edema, swelling or tenderness, except as noted in detailed exam. Integumentary: No impressive skin lesions present, except as noted in detailed exam. Musculoskeletal: Unremarkable, except as noted in detailed exam.  Left shoulder exam: SKIN: normal SWELLING: none WARMTH: none LYMPH NODES: no adenopathy palpable CREPITUS: none TENDERNESS:  Minimally tender over anterolateral shoulder. ROM (active):  Forward flexion: 95 degrees Abduction: 90 degrees Internal rotation: Left buttock ROM (passive):  Forward flexion: 110 degrees Abduction: 105 degrees ER/IR at 90 abd: 70 degrees / 30 degrees   He notes mild-moderate pain at  the extremes of all motions.   STRENGTH: Forward flexion: 4+/5 Abduction: 4+/5 External rotation: 4-4+/5 Internal rotation: 4+/5 Pain with RC testing: Mild pain with resisted forward flexion and abduction.   STABILITY: Normal   SPECIAL TESTS: Luan Pulling' test: positive, moderate Speed's test: Not evaluated Capsulitis - pain w/ passive ER: no Crossed arm test: Mildly positive Crank: Not evaluated Anterior apprehension: Negative Posterior apprehension: Not evaluated   He is neurovascularly intact to the left upper extremity.  Imaging: CT OF THE UPPER LEFT EXTREMITY WITHOUT CONTRAST:  1. Severe osteoarthritis of the left glenohumeral joint.   Impression: 1. Post-traumatic osteoarthritis of left shoulder. 2. Rotator cuff tendinitis, left.  Plan:  1. Treatment options were discussed today with the patient. 2. The patient is scheduled for a left reverse total shoulder arthroplasty. Surgery scheduled with Dr. Roland Rack on 03/06/2021. 3. The patient was instructed on the risk and benefits of surgery and wishes to proceed at this time. 4. This document will serve as a surgical history and physical for the patient. The patient was offered and received a left slingshot shoulder sling to wear following surgery. 5. The patient will follow-up per standard postop protocol. They can call the clinic they have any questions, new symptoms develop or symptoms worsen.  The procedure was discussed with the patient, as were the potential risks (including bleeding, infection, nerve and/or blood vessel injury, persistent or recurrent pain, failure of the hardware, dislocation, need for further surgery, blood clots, strokes, heart attacks and/or arhythmias, pneumonia, etc.) and benefits. The patient states his understanding and wishes to proceed.   H&P reviewed and patient re-examined. No changes.

## 2021-03-06 NOTE — Anesthesia Preprocedure Evaluation (Signed)
Anesthesia Evaluation  Patient identified by MRN, date of birth, ID band Patient awake    Reviewed: Allergy & Precautions, NPO status , Patient's Chart, lab work & pertinent test results  History of Anesthesia Complications Negative for: history of anesthetic complications  Airway Mallampati: II  TM Distance: >3 FB Neck ROM: Full    Dental no notable dental hx. (+) Teeth Intact   Pulmonary neg pulmonary ROS, neg sleep apnea, neg COPD, Patient abstained from smoking.Not current smoker, former smoker,    Pulmonary exam normal breath sounds clear to auscultation       Cardiovascular Exercise Tolerance: Good METShypertension, Pt. on medications + CAD and + CABG  (-) Past MI and (-) CHF (-) dysrhythmias + Valvular Problems/Murmurs (s/p aortic valve replacement, s/p thoracic aneursym repair)  Rhythm:Regular Rate:Normal - Systolic murmurs S/p aortic valve repair and aortic aneurysm repair   Neuro/Psych  Headaches, neg Seizures negative psych ROS   GI/Hepatic Neg liver ROS, neg GERD  ,  Endo/Other  neg diabetesHypothyroidism   Renal/GU negative Renal ROS     Musculoskeletal   Abdominal   Peds  Hematology   Anesthesia Other Findings Past Medical History: 2014: Aortic stenosis     Comment:  08-2017  severe;   s/p  avr  No date: Arthritis     Comment:  back and joints No date: Cardiac murmur cardiologist-- dr Stanford Breed: Coronary artery disease     Comment:  10-01-2017  s/p  cabg x2 No date: ED (erectile dysfunction) No date: Headache No date: Hyperlipidemia No date: Hypertension No date: Hypothyroid     Comment:  followed by pcp No date: Nocturia No date: Pneumonia urologist-- dr Tresa Moore: Primary prostate cancer with metastasis from  prostate to other site [facility])     Comment:  first dx 08-01-2013;  Stage T3a, gleason 3+4;  s/p                radical prostatectomy w/ node dissection 10-20-2013  and                completed post surgery IMRT  04-19-2014 10/01/2017: S/P aortic valve replacement     Comment:  edwards pericardial valve 10/01/2017: S/P CABG x 2     Comment:  LIMA to Diagonal;   SVG to OM 03/05/2014 through 04/19/2014: S/P radiation therapy     Comment:  Prostate bed 6600 cGy in 33 sessions, pelvic lymph nodes              5000 445 cGy in 33 sessions  No date: Wears glasses  Reproductive/Obstetrics                             Anesthesia Physical  Anesthesia Plan  ASA: 3  Anesthesia Plan: General   Post-op Pain Management: Regional block and Ofirmev IV (intra-op)   Induction: Intravenous  PONV Risk Score and Plan: 2 and Midazolam, Ondansetron, Dexamethasone and Treatment may vary due to age or medical condition  Airway Management Planned: Oral ETT  Additional Equipment: None  Intra-op Plan:   Post-operative Plan: Extubation in OR  Informed Consent: I have reviewed the patients History and Physical, chart, labs and discussed the procedure including the risks, benefits and alternatives for the proposed anesthesia with the patient or authorized representative who has indicated his/her understanding and acceptance.     Dental advisory given  Plan Discussed with: CRNA and Surgeon  Anesthesia Plan Comments: (Discussed risks of anesthesia with  patient, including PONV, sore throat, lip/dental/eye damage. Rare risks discussed as well, such as cardiorespiratory and neurological sequelae, and allergic reactions. Discussed the role of CRNA in patient's perioperative care. Patient understands. Discussed r/b/a of interscalene block, including elective nature. Risks discussed: - Rare: bleeding, infection, nerve damage - shortness of breath from hemidiaphragmatic paralysis - unilateral horner's syndrome - poor/non-working blocks - reactions and toxicity to local anesthetic Patient understands and agrees. )        Anesthesia  Quick Evaluation

## 2021-03-06 NOTE — Transfer of Care (Signed)
Immediate Anesthesia Transfer of Care Note  Patient: Danny Odom  Procedure(s) Performed: REVERSE SHOULDER ARTHROPLASTY (Left: Shoulder)  Patient Location: PACU  Anesthesia Type:General  Level of Consciousness: awake  Airway & Oxygen Therapy: Patient Spontanous Breathing and Patient connected to face mask oxygen  Post-op Assessment: Report given to RN and Post -op Vital signs reviewed and stable  Post vital signs: Reviewed and stable  Last Vitals:  Vitals Value Taken Time  BP 128/76 03/06/21 1348  Temp    Pulse 69 03/06/21 1353  Resp 17 03/06/21 1353  SpO2 100 % 03/06/21 1353  Vitals shown include unvalidated device data.  Last Pain:  Vitals:   03/06/21 0926  TempSrc: Temporal  PainSc: 1       Patients Stated Pain Goal: 0 (62/37/62 8315)  Complications: No notable events documented.

## 2021-03-06 NOTE — Discharge Instructions (Addendum)
Orthopedic discharge instructions: May shower with intact op-site dressing once nerve block has worn off.  Apply ice frequently to shoulder or use polar care device. Take ibuprofen 600-800 mg TID with meals for 7-10 days, then as necessary. Take hydrocodone as prescribed when needed.  May supplement with ES Tylenol if necessary. Keep shoulder immobilizer on at all times except may remove for bathing purposes. Follow-up in 10-14 days or as scheduled.  AMBULATORY SURGERY  DISCHARGE INSTRUCTIONS   The drugs that you were given will stay in your system until tomorrow so for the next 24 hours you should not:  Drive an automobile Make any legal decisions Drink any alcoholic beverage   You may resume regular meals tomorrow.  Today it is better to start with liquids and gradually work up to solid foods.  You may eat anything you prefer, but it is better to start with liquids, then soup and crackers, and gradually work up to solid foods.   Please notify your doctor immediately if you have any unusual bleeding, trouble breathing, redness and pain at the surgery site, drainage, fever, or pain not relieved by medication.    Additional Instructions:        Please contact your physician with any problems or Same Day Surgery at [phone], Monday through Friday 6 am to 4 pm, or [place] at [facility] number at [phone].      Interscalene Nerve Block with Exparel   For your surgery you have received an Interscalene Nerve Block with Exparel. Nerve Blocks affect many types of nerves, including nerves that control movement, pain and normal sensation.  You may experience feelings such as numbness, tingling, heaviness, weakness or the inability to move your arm or the feeling or sensation that your arm has "fallen asleep". A nerve block with Exparel can last up to 5 days.  Usually the weakness wears off first.  The tingling and heaviness usually wear off next.  Finally you may start  to notice pain.  Keep in mind that this may occur in any order.  Once a nerve block starts to wear off it is usually completely gone within 60 minutes. ISNB may cause mild shortness of breath, a hoarse voice, blurry vision, unequal pupils, or drooping of the face on the same side as the nerve block.  These symptoms will usually resolve with the numbness.  Very rarely the procedure itself can cause mild seizures. If needed, your surgeon will give you a prescription for pain medication.  It will take about 60 minutes for the oral pain medication to become fully effective.  So, it is recommended that you start taking this medication before the nerve block first begins to wear off, or when you first begin to feel discomfort. Take your pain medication only as prescribed.  Pain medication can cause sedation and decrease your breathing if you take more than you need for the level of pain that you have. Nausea is a common side effect of many pain medications.  You may want to eat something before taking your pain medicine to prevent nausea. After an Interscalene nerve block, you cannot feel pain, pressure or extremes in temperature in the effected arm.  Because your arm is numb it is at an increased risk for injury.  To decrease the possibility of injury, please practice the following:  While you are awake change the position of your arm frequently to prevent too much pressure on any one area for prolonged periods of time.  If  you have a cast or tight dressing, check the color or your fingers every couple of hours.  Call your surgeon with the appearance of any discoloration (white or blue). If you are given a sling to wear before you go home, please wear it  at all times until the block has completely worn off.  Do not get up at night without your sling. Please contact [facility] or your surgeon if you do not begin to regain sensation after 7 days from the surgery.  Anesthesia may be contacted by calling the  Same Day Surgery Department, Mon. through Fri., 6 am to 4 pm at [phone].   If you experience any other problems or concerns, please contact your surgeon's office. If you experience severe or prolonged shortness of breath go to the nearest emergency department.

## 2021-03-06 NOTE — Op Note (Signed)
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  Post-Op Diagnosis:   Same  Procedure:   Reverse left total shoulder arthroplasty with biceps tenodesis.  Surgeon:   Pascal Lux, MD  Assistant:   Cameron Proud, PA-C; Rocco Pauls, PA-S  Anesthesia:   General endotracheal with an interscalene block using Exparel placed preoperatively by the anesthesiologist.  Findings:   As above.  Complications:   None  EBL:   100 cc  Fluids:   800 cc crystalloid  UOP:   None  TT:   None  Drains:   None  Closure:   Staples  Implants:   All press-fit Arthrex system with a 12 mm Apex humeral stem, a 39 mm SutureCup with a +3 insert, a 24 mm base plate with a 20 degree augment, and a 39 mm +2 lateralized glenosphere.  Brief Clinical Note:   The patient is a 71 year old male with a long history of gradually worsening left shoulder pain and stiffness.  His symptoms have progressed despite medications, activity modification, etc.  His history and examination are consistent with advanced degenerative joint disease.  Preoperative imaging confirmed the presence of a B2 glenoid. The patient presents at this time for a reverse left total shoulder arthroplasty.  Procedure:   The patient underwent placement of an interscalene block using Exparel by the anesthesiologist in the preoperative holding area before being brought into the operating room and lain in the supine position. The patient then underwent general endotracheal intubation and anesthesia before the patient was repositioned in the beach chair position using the beach chair positioner. The left shoulder and upper extremity were prepped with ChloraPrep solution before being draped sterilely. Preoperative antibiotics were administered. A timeout was performed to verify the appropriate surgical site.    A standard anterior approach  to the shoulder was made through an approximately 4-5 inch incision. The incision was carried down through the subcutaneous tissues to expose the deltopectoral fascia. The interval between the deltoid and pectoralis muscles was identified and this plane developed, retracting the cephalic vein laterally with the deltoid muscle. The conjoined tendon was identified. Its lateral margin was dissected and the Kolbel self-retraining retractor inserted. The "three sisters" were identified and cauterized. Bursal tissues were removed to improve visualization. The subscapularis tendon was released from its attachment to the lesser tuberosity 1 cm proximal to its insertion and several tagging sutures placed. The inferior capsule was released with care after identifying and protecting the axillary nerve. The proximal humeral cut was made at approximately 30 of retroversion using the extra-medullary guide.   Attention was redirected to the glenoid. The labrum was debrided circumferentially before the center of the glenoid was marked with electrocautery. The guidewire was drilled into the glenoid neck using the appropriate guide. After verifying its position, it was overreamed with the 20 degree angled baseplate reamer to create a flat surface . The central 10 mm coring reamer was then inserted and advanced to 25 mm. The permanent mini-baseplate construct with a 25 mm central peg attachment was put together on the back table before being impacted into the glenoid. The baseplate was then further secured using four peripheral screws. Locking screws were placed anteriorly, superiorly, and posteriorly while one non-locking screw was placed inferiorly. The permanent 39 mm +2 mm lateralized glenosphere was then impacted into place and its Morse taper locking mechanism verified  using manual distraction. Finally, the glenosphere locking screw was inserted and tightened securely.  Attention was directed to the humeral side. The  humeral canal was reamed with first the 5 mm and then the 6 mm reamer. The canal was broached beginning with a #5.5 broach and progressing to a #12 broach. This was left in place and the metaphyseal inset reamer was used to create the metaphyseal socket.  A trial reduction performed using the 39 mm trial humeral platform with the +3 mm insert. The arm demonstrated excellent range of motion as the hand could be brought across the chest to the opposite shoulder and brought to the top of the patient's head and to the patient's ear. The shoulder appeared stable throughout this range of motion. The joint was dislocated and the trial components removed. The permanent #12 Apex stem was impacted into place with care taken to maintain the appropriate version. The permanent 39 mm SutureCup humeral platform was then impacted into place before the +3 mm insert was snapped into place. The shoulder was relocated using two finger pressure and again placed through a range of motion with the findings as described above.  The wound was copiously irrigated with sterile saline solution using the jet lavage system before a total of 30 cc of 0.5% Sensorcaine with epinephrine mixed with 10 cc of Exparel was injected into the pericapsular and peri-incisional tissues to help with postoperative analgesia. The subscapularis tendon was reapproximated using #2 FiberWire interrupted sutures. The deltopectoral interval was closed using #0 Vicryl interrupted sutures before the subcutaneous tissues were closed using 2-0 Vicryl interrupted sutures. The skin was closed using staples. Prior to closing the skin, 1 g of transexemic acid in 10 cc of normal saline was injected intra-articularly to help with postoperative bleeding. A sterile occlusive dressing was applied to the wound before the arm was placed into a shoulder immobilizer with an abduction pillow. A Polar Care system also was applied to the shoulder. The patient was then awakened,  extubated, and returned to the recovery room in satisfactory condition after tolerating the procedure well.

## 2021-03-06 NOTE — Anesthesia Postprocedure Evaluation (Signed)
Anesthesia Post Note  Patient: Danny Odom  Procedure(s) Performed: REVERSE SHOULDER ARTHROPLASTY (Left: Shoulder)  Patient location during evaluation: PACU Anesthesia Type: General Level of consciousness: awake and alert Pain management: pain level controlled Vital Signs Assessment: post-procedure vital signs reviewed and stable Respiratory status: spontaneous breathing, nonlabored ventilation, respiratory function stable and patient connected to nasal cannula oxygen Cardiovascular status: blood pressure returned to baseline and stable Postop Assessment: no apparent nausea or vomiting Anesthetic complications: no   No notable events documented.   Last Vitals:  Vitals:   03/06/21 1425 03/06/21 1441  BP: 122/75 134/88  Pulse: 63 65  Resp: 19 18  Temp: 36.4 C (!) 36.2 C  SpO2: 93% 92%    Last Pain:  Vitals:   03/06/21 1441  TempSrc: Temporal  PainSc: 0-No pain                 Arita Miss

## 2021-03-06 NOTE — Anesthesia Procedure Notes (Signed)
Anesthesia Regional Block: Interscalene brachial plexus block   Pre-Anesthetic Checklist: , timeout performed,  Correct Patient, Correct Site, Correct Laterality,  Correct Procedure, Correct Position, site marked,  Risks and benefits discussed,  Surgical consent,  Pre-op evaluation,  At surgeon's request and post-op pain management  Laterality: Left  Prep: chloraprep       Needles:  Injection technique: Single-shot  Needle Type: Echogenic Needle     Needle Length: 4cm  Needle Gauge: 25     Additional Needles:   Narrative:  Injection made incrementally with aspirations every 5 mL.  Performed by: Personally  Anesthesiologist: Arita Miss, MD  Additional Notes: Patient's chart reviewed and they were deemed appropriate candidate for procedure, at surgeon's request. Patient educated about risks, benefits, and alternatives of the block including but not limited to: temporary or permanent nerve damage, bleeding, infection, damage to surround tissues, pneumothorax, hemidiaphragmatic paralysis, unilateral Horner's syndrome, block failure, local anesthetic toxicity. Patient expressed understanding. A formal time-out was conducted consistent with institution rules.  Monitors were applied, and minimal sedation used (see nursing record). The site was prepped with skin prep and allowed to dry, and sterile gloves were used. A high frequency linear ultrasound probe with probe cover was utilized throughout. C5-7 nerve roots located and appeared anatomically normal, local anesthetic injected around them, and echogenic block needle trajectory was monitored throughout. Aspiration performed every 50ml. Lung and blood vessels were avoided. All injections were performed without resistance and free of blood and paresthesias. The patient tolerated the procedure well.  Injectate: 69ml exparel + 40ml 0.5% bupivacaine

## 2021-03-10 ENCOUNTER — Encounter: Payer: Self-pay | Admitting: Surgery

## 2021-03-10 LAB — SURGICAL PATHOLOGY

## 2021-03-10 NOTE — Progress Notes (Signed)
03/10/2021: phone call to patient to check up on him after nerve block. He is doing well from a pain standpoint and is satisfied. He did mention having a low grade fever. Denies surgical site changes or concern for infection, denies trouble breathing or sputum. I advised patient to contact surgeon's office if signs of wound infection develop, or to go to the ED if concerning signs of any pneumonia or brewing infection. Patient does not seem concerned at this point.

## 2021-03-10 NOTE — Addendum Note (Signed)
Addendum  created 03/10/21 1407 by Arita Miss, MD   Clinical Note Signed

## 2021-03-11 ENCOUNTER — Encounter: Payer: Self-pay | Admitting: Surgery

## 2021-04-27 ENCOUNTER — Other Ambulatory Visit: Payer: Self-pay | Admitting: Cardiology

## 2021-04-27 DIAGNOSIS — I10 Essential (primary) hypertension: Secondary | ICD-10-CM

## 2021-07-21 ENCOUNTER — Other Ambulatory Visit: Payer: Self-pay | Admitting: Cardiology

## 2021-07-21 DIAGNOSIS — I10 Essential (primary) hypertension: Secondary | ICD-10-CM

## 2021-08-26 NOTE — Progress Notes (Signed)
HPI: FU AVR and CAD. Echocardiogram repeated July 2019 and showed normal LV function, functionally bicuspid aortic valve with moderate aortic stenosis (mean gradient 30 mmHg), moderate aortic insufficiency, mild mitral regurgitation. CTA July 2019 showed 3.8 to 3.9 cm aortic root. Cardiac catheterization July 2019 showed normal LV function, left ventricular end-diastolic pressure 23 mmHg, moderate aortic stenosis, 90% first diagonal and 75% circumflex.  Preoperative carotid Dopplers August 2019 showed no significant stenosis.  Patient had aortic valve replacement with pericardial valve as well as coronary artery bypass and graft with a LIMA to the diagonal and saphenous vein graft to the obtuse marginal in August 2019.  Echo 8/20 showed normal LV function; prior AVR with normal function and no AI. CTA 8/20 showed ascending aortic aneurysm of 5 cm and ascending dissection. Pt subsequently had ascending aortic root replacement 09/21/18.   Follow-up CTA August 2022 showed previous repair of ascending aorta without recurrence of pseudoaneurysm, mild fusiform aneurysmal dilatation of the ascending thoracic aorta at 42 mm and prior aortic valve replacement; possible MAI.  Since last seen, the patient denies any dyspnea on exertion, orthopnea, PND, pedal edema, palpitations, syncope or chest pain.  Pathology from the biopsy  Current Outpatient Medications  Medication Sig Dispense Refill   acetaminophen (TYLENOL) 500 MG tablet Take 500 mg by mouth every 8 (eight) hours as needed for moderate pain.     aspirin EC 81 MG tablet Take 81 mg by mouth daily. Swallow whole.     atorvastatin (LIPITOR) 40 MG tablet Take 1 tablet (40 mg total) by mouth at bedtime. 14 tablet 0   HYDROcodone-acetaminophen (NORCO) 5-325 MG tablet Take 1-2 tablets by mouth every 6 (six) hours as needed for moderate pain. MAXIMUM TOTAL ACETAMINOPHEN DOSE IS 4000 MG PER DAY 40 tablet 0   levothyroxine (SYNTHROID) 75 MCG tablet Take 1 tablet  (75 mcg total) by mouth daily before breakfast. 14 tablet 0   losartan (COZAAR) 50 MG tablet TAKE 1 TABLET BY MOUTH ONCE DAILY MUST  KEEP  APPOINTMENT  FOR  MORE  REFILLS 49 tablet 0   Methylcobalamin (B-12) 5000 MCG TBDP Take 5,000 mcg by mouth daily.     metoprolol tartrate (LOPRESSOR) 25 MG tablet Take 0.5 tablets (12.5 mg total) by mouth 2 (two) times daily. 14 tablet 0   Multiple Vitamin (MULTIVITAMIN WITH MINERALS) TABS tablet Take 1 tablet by mouth daily with breakfast. ONE-A-DAY MEN'S 50+     zinc gluconate 50 MG tablet Take 50 mg by mouth at bedtime.      No current facility-administered medications for this visit.     Past Medical History:  Diagnosis Date   Aortic stenosis 2014   08-2017  severe;   s/p  avr    Arthritis    back and joints   Cardiac murmur    Coronary artery disease cardiologist-- dr Stanford Breed   10-01-2017  s/p  cabg x2   ED (erectile dysfunction)    Headache    Hyperlipidemia    Hypertension    Hypothyroid    followed by pcp   Nocturia    Pneumonia    Primary prostate cancer with metastasis from prostate to other site [facility]) urologist-- dr Tresa Moore   first dx 08-01-2013;  Stage T3a, gleason 3+4;  s/p  radical prostatectomy w/ node dissection 10-20-2013  and completed post surgery IMRT  04-19-2014   S/P aortic valve replacement 10/01/2017   edwards pericardial valve   S/P CABG x 2 10/01/2017  LIMA to Diagonal;   SVG to OM   S/P radiation therapy 03/05/2014 through 04/19/2014    Prostate bed 6600 cGy in 33 sessions, pelvic lymph nodes 5000 445 cGy in 33 sessions                           Wears glasses     Past Surgical History:  Procedure Laterality Date   AORTIC VALVE REPLACEMENT N/A 10/01/2017   Procedure: AORTIC VALVE REPLACEMENT (AVR);  Surgeon: Gaye Pollack, MD;  Location: [place];  Service: Open Heart Surgery;  Laterality: N/A;   ASCENDING AORTIC ROOT REPLACEMENT N/A 09/21/2018   Procedure: REPAIR OF ASCENDING AORTIC ANEURYSM USING 32 MM  HEMASHIELD PLATINUM WOVEN DOUBLE VELOUR VASCULAR GRAFT WITH REATTACHMENT OF PREVIOUS SAPHENOUS VEIN GRAFT ;  Surgeon: [name], MD;  Location: [place];  Service: Open Heart Surgery;  Laterality: N/A;   COLONOSCOPY     COLONOSCOPY WITH PROPOFOL N/A 04/19/2019   Procedure: COLONOSCOPY WITH PROPOFOL;  Surgeon: Robert Bellow, MD;  Location: [facility];  Service: Gastroenterology;  Laterality: N/A;   CORONARY ARTERY BYPASS GRAFT N/A 10/01/2017   Procedure: CORONARY ARTERY BYPASS GRAFTING (CABG) x two, using left internal mammary artery and right leg greater saphenous vein harvested endoscopically;  Surgeon: Gaye Pollack, MD;  Location: [place] [facility];  Service: Open Heart Surgery;  Laterality: N/A;   CORONARY ARTERY BYPASS GRAFT N/A 09/21/2018   Procedure: OPEN RIGHT SAPHENOUS VEIN HARVEST;  Surgeon: [name], MD;  Location: [place];  Service: Open Heart Surgery;  Laterality: N/A;   LYMPHADENECTOMY Bilateral 10/20/2013   Procedure: BILATERAL PELVIC LYMPH NODE DISSECTION;  Surgeon: Alexis Frock, MD;  Location: [facility];  Service: Urology;  Laterality: Bilateral;   ORCHIECTOMY Bilateral 08/26/2018   Procedure: ORCHIECTOMY;  Surgeon: Alexis Frock, MD;  Location[facility];  Service: Urology;  Laterality: Bilateral;   REVERSE SHOULDER ARTHROPLASTY Left 03/06/2021   Procedure: REVERSE SHOULDER ARTHROPLASTY;  Surgeon: Corky Mull, MD;  Location: [facility];  Service: Orthopedics;  Laterality: Left;   RIGHT/LEFT HEART CATH AND CORONARY ANGIOGRAPHY N/A 08/23/2017   Procedure: RIGHT/LEFT HEART CATH AND CORONARY ANGIOGRAPHY;  Surgeon: Leonie Man, MD;  Location: [place] [facility];  Service: Cardiovascular;  Laterality: N/A;   ROBOT ASSISTED LAPAROSCOPIC RADICAL PROSTATECTOMY N/A 10/20/2013   Procedure: ROBOTIC ASSISTED LAPAROSCOPIC RADICAL PROSTATECTOMY WITH INDOCYANINE GREEN DYE;  Surgeon: Alexis Frock, MD;  Location: [facility];  Service: Urology;  Laterality: N/A;   TEE WITHOUT  CARDIOVERSION N/A 10/01/2017   Procedure: TRANSESOPHAGEAL ECHOCARDIOGRAM (TEE);  Surgeon: Gaye Pollack, MD;  Location: [place];  Service: Open Heart Surgery;  Laterality: N/A;   TEE WITHOUT CARDIOVERSION N/A 09/21/2018   Procedure: TRANSESOPHAGEAL ECHOCARDIOGRAM (TEE);  Surgeon: [name], MD;  Location: [place];  Service: Open Heart Surgery;  Laterality: N/A;   TONSILLECTOMY  child   varicocele surgery  Right 1983 aaprx    Social History   Socioeconomic History   Marital status: Divorced    Spouse name: Not on file   Number of children: 2   Years of education: Not on file   Highest education level: Not on file  Occupational History   Not on file  Tobacco Use   Smoking status: Former    Packs/day: 1.00    Years: 20.00    Total pack years: 20.00    Types: Cigarettes    Quit date: 02/15/1983    Years  since quitting: 38.5   Smokeless tobacco: Never  Vaping Use   Vaping Use: Never used  Substance and Sexual Activity   Alcohol use: Not Currently    Alcohol/week: 0.0 standard drinks of alcohol    Comment: rarely    Drug use: Never   Sexual activity: Not on file  Other Topics Concern   Not on file  Social History Narrative   Not on file   Social Determinants of Health   Financial Resource Strain: Not on file  Food Insecurity: Not on file  Transportation Needs: Not on file  Physical Activity: Not on file  Stress: Not on file  Social Connections: Not on file  Intimate Partner Violence: Unknown (09/16/2017)   Humiliation, Afraid, Rape, and Kick questionnaire    Fear of Current or Ex-Partner: No    Emotionally Abused: Not on file    Physically Abused: Not on file    Sexually Abused: Not on file    Family History  Problem Relation Age of Onset   Emphysema Father    Prostate cancer Father    Breast cancer Mother     ROS: no fevers or chills, productive cough, hemoptysis, dysphasia, odynophagia, melena, hematochezia, dysuria, hematuria, rash, seizure activity,  orthopnea, PND, pedal edema, claudication. Remaining systems are negative.  Physical Exam: Well-developed well-nourished in no acute distress.  Skin is warm and dry.  HEENT is normal.  Neck is supple.  Chest is clear to auscultation with normal expansion.  Cardiovascular exam is regular rate and rhythm.  1/6 systolic murmur right sternal border.  No diastolic murmur. Abdominal exam nontender or distended. No masses palpated. Extremities show no edema. neuro grossly intact  A/P  1 status post aortic valve replacement-continue SBE prophylaxis.  2 previous aortic root replacement secondary to aortic aneurysm/aortic dissection-we will arrange follow-up CTA.  3 coronary artery disease-patient denies chest pain.  Continue aspirin and statin.  4 hypertension-patient's blood pressure is controlled.  Continue present medications.  5 hyperlipidemia-continue statin.  [name], MD

## 2021-09-01 ENCOUNTER — Other Ambulatory Visit: Payer: Self-pay | Admitting: Cardiology

## 2021-09-01 DIAGNOSIS — I10 Essential (primary) hypertension: Secondary | ICD-10-CM

## 2021-09-09 ENCOUNTER — Ambulatory Visit (INDEPENDENT_AMBULATORY_CARE_PROVIDER_SITE_OTHER): Payer: Self-pay | Admitting: Cardiology

## 2021-09-09 ENCOUNTER — Encounter: Payer: Self-pay | Admitting: Cardiology

## 2021-09-09 VITALS — BP 130/72 | HR 60 | Ht 70.0 in | Wt 205.0 lb

## 2021-09-09 DIAGNOSIS — I10 Essential (primary) hypertension: Secondary | ICD-10-CM

## 2021-09-09 DIAGNOSIS — I712 Thoracic aortic aneurysm, without rupture, unspecified: Secondary | ICD-10-CM

## 2021-09-09 DIAGNOSIS — E7800 Pure hypercholesterolemia, unspecified: Secondary | ICD-10-CM

## 2021-09-09 DIAGNOSIS — I2510 Atherosclerotic heart disease of native coronary artery without angina pectoris: Secondary | ICD-10-CM

## 2021-09-09 DIAGNOSIS — Z952 Presence of prosthetic heart valve: Secondary | ICD-10-CM

## 2021-09-09 NOTE — Patient Instructions (Signed)

## 2021-09-16 ENCOUNTER — Other Ambulatory Visit: Payer: Self-pay | Admitting: *Deleted

## 2021-09-16 DIAGNOSIS — I712 Thoracic aortic aneurysm, without rupture, unspecified: Secondary | ICD-10-CM

## 2021-09-16 NOTE — Progress Notes (Signed)
cta

## 2021-10-07 ENCOUNTER — Ambulatory Visit
Admission: RE | Admit: 2021-10-07 | Discharge: 2021-10-07 | Disposition: A | Discharge status: Home / Self Care | Payer: Medicare Other | Source: Ambulatory Visit | Attending: Cardiology | Admitting: Cardiology

## 2021-10-07 DIAGNOSIS — I712 Thoracic aortic aneurysm, without rupture, unspecified: Secondary | ICD-10-CM

## 2021-10-07 MED ORDER — IOPAMIDOL (ISOVUE-370) INJECTION 76%
75.0000 mL | Freq: Once | INTRAVENOUS | Status: AC | PRN
  Administered 2021-10-07: 75 mL via INTRAVENOUS

## 2021-10-09 ENCOUNTER — Encounter: Payer: Self-pay | Admitting: *Deleted

## 2021-12-01 IMAGING — CT CT ANGIO CHEST
2 of 6 series · 13 of 36 positions shown · IV contrast (omnipaque)
Comparison: September 20, 2018

CLINICAL DATA: Thoracic aortic aneurysm follow-up

EXAM:
CT ANGIOGRAPHY CHEST WITH CONTRAST
TECHNIQUE: Multidetector CT imaging of the chest was performed using the
standard protocol during bolus administration of intravenous
contrast. Multiplanar CT image reconstructions and MIPs were
obtained to evaluate the vascular anatomy.
CONTRAST:  75mL OMNIPAQUE IOHEXOL 350 MG/ML SOLN

[Series 5: axial arterial cta thorax 2.00 · axial · arterial · 0.78mm/px · z∈[-1260,-922]mm · 12 of 201 slices shown]
[im 16/201  lung]
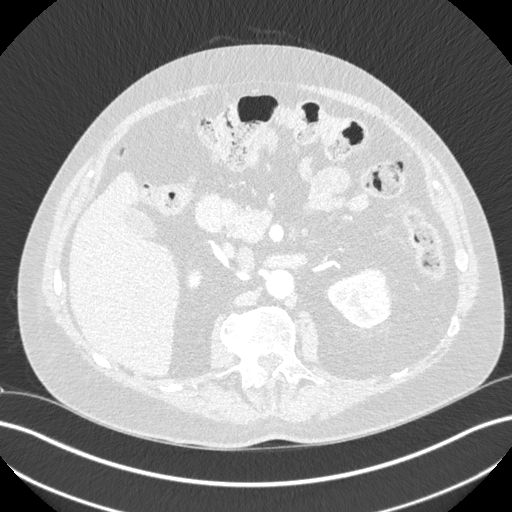
[im 31/201  mediastinal]
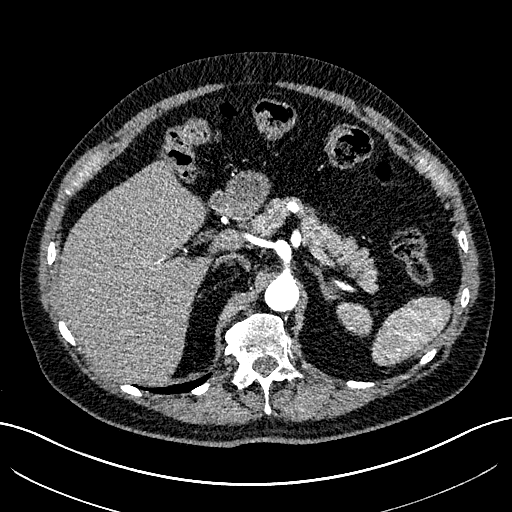
[im 47/201  lung]
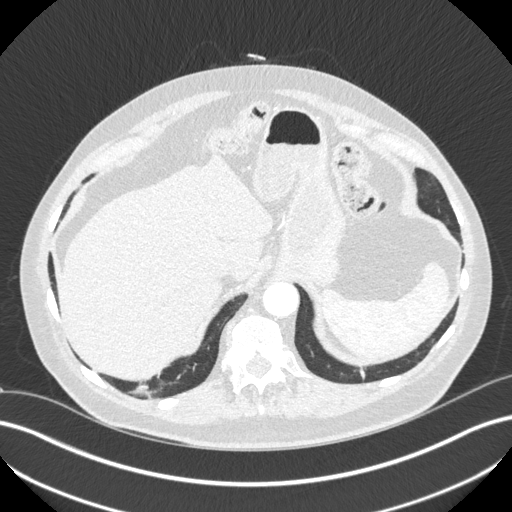
[im 62/201  mediastinal]
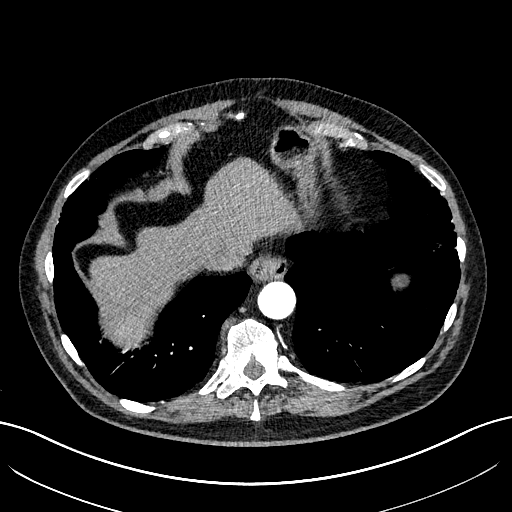
[im 77/201  lung]
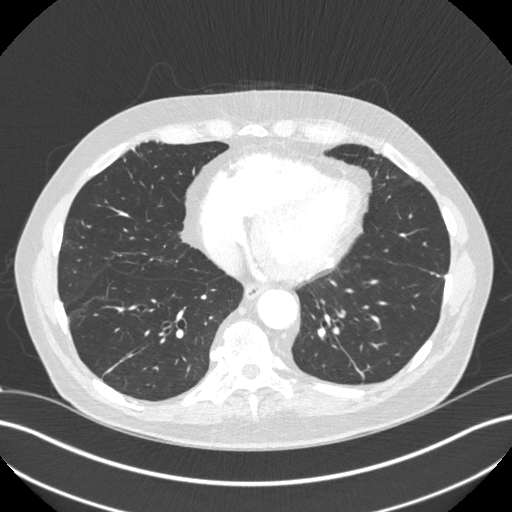
[im 93/201  mediastinal]
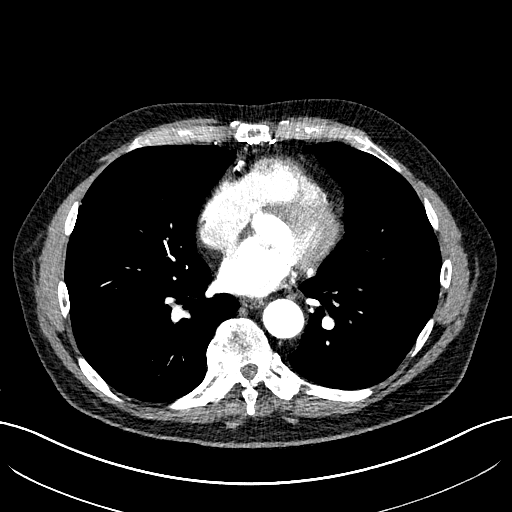
[im 108/201  lung]
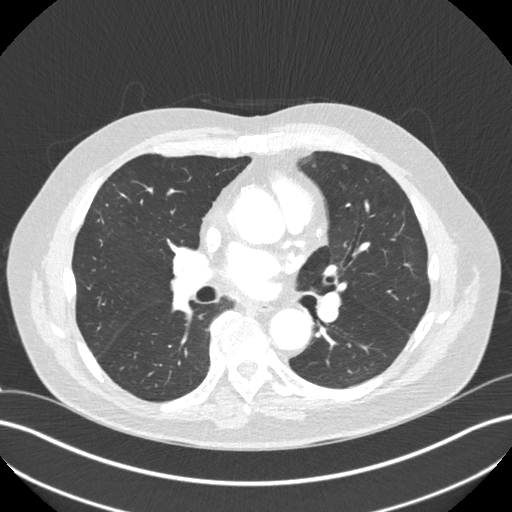
[im 124/201  mediastinal]
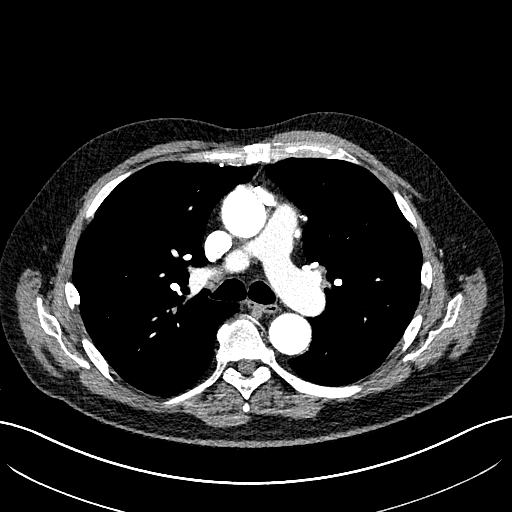
[im 139/201  lung]
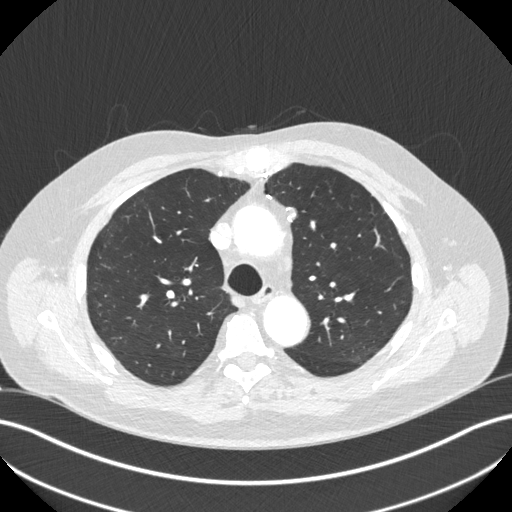
[im 154/201  mediastinal]
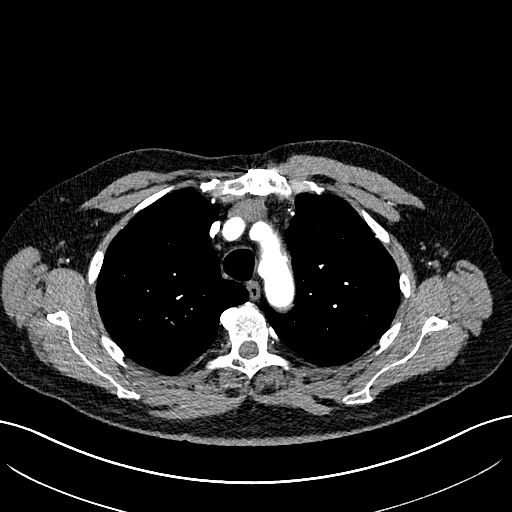
[im 170/201  lung]
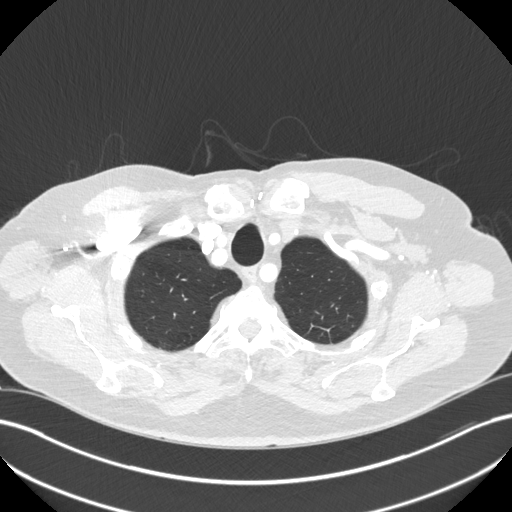
[im 185/201  mediastinal]
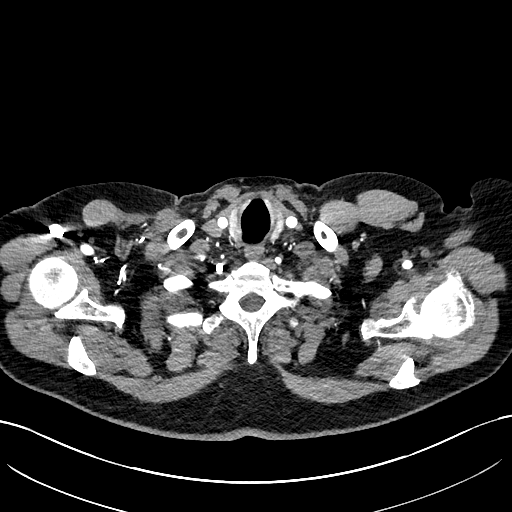

[Series 8: cor st cta thorax 2.00 cor · coronal · 0.78mm/px · 1 of 167 slices shown]
[im 84/167  mediastinal]
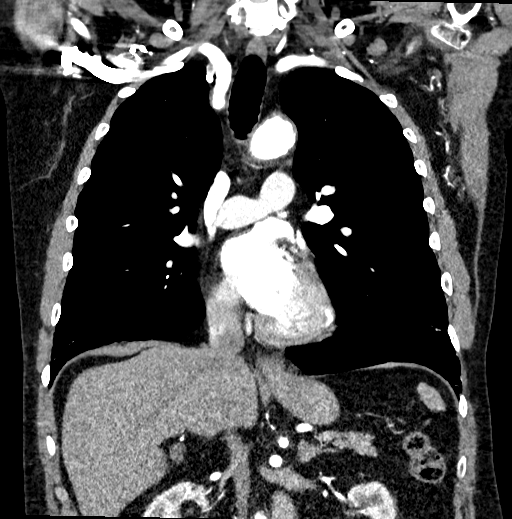

[13 of 36 positions shown; findings below may reference images not displayed]

FINDINGS: Cardiovascular: Evidence of ascending aorta repair of large
pseudoaneurysms which were seen on previous exam. Dilation of the
sinus to 3.7 cm just above the aortic valve is within 1 mm of its
previous caliber. Is the prosthetic aortic valve remains in place.

Evidence of CABG and [REDACTED] grafting is seen as before.

Caliber of the ascending thoracic aorta is 3.7 cm at the lower level
of graft material, when measured in the axial plane, in the coronal
plane this is approximately 3.2 cm.

Branch vessels in the chest are patent. No signs of recurrent or new
pseudoaneurysm.

Scattered atherosclerosis elsewhere.

Heart size is stable without pericardial effusion.

Central pulmonary vasculature grossly normal, not well assessed on
this phase of contrast optimized for the aorta.

Mediastinum/Nodes: Esophagus is normal.

No adenopathy in the chest.

Lungs/Pleura: Tree-in-bud opacities in the RIGHT middle lobe and in
the lingula. Partially calcified pleural nodularity along the major
fissure in the LEFT chest. No consolidation. No pleural effusion.
Basilar atelectasis.

Upper Abdomen: Incidental imaging of upper abdominal contents with
nephrolithiasis. Large calculus in the interpolar RIGHT kidney at 8
mm. This is enlarged since 6911 where it measured approximately 5
mm. Kidneys are incompletely imaged. Adrenal glands are normal.

No acute findings in the upper abdomen.

Musculoskeletal: Glenohumeral degenerative changes. No acute or
destructive bone process. Post median sternotomy.

Review of the MIP images confirms the above findings.
IMPRESSION: 1. Evidence of ascending aorta repair of large pseudoaneurysms which
were seen on previous exam. No signs of recurrent or new
pseudoaneurysm.
2. Post aortic valve repair with stable appearance of mildly dilated
aortic root. Attention on follow-up.
3. Tree-in-bud opacities in the RIGHT middle lobe and lingula may
represent sequela of chronic infection and are little changed since
the previous study.
4. Nephrolithiasis with enlarging calculus in the interpolar RIGHT
kidney.
5. Aortic atherosclerosis.

Aortic Atherosclerosis ([id number]-I5X.X).

## 2021-12-03 ENCOUNTER — Other Ambulatory Visit: Payer: Self-pay | Admitting: Surgery

## 2021-12-12 ENCOUNTER — Other Ambulatory Visit: Payer: Self-pay

## 2021-12-12 ENCOUNTER — Encounter
Admission: RE | Admit: 2021-12-12 | Discharge: 2021-12-12 | Disposition: A | Discharge status: Home / Self Care | Payer: Medicare Other | Source: Ambulatory Visit | Attending: Surgery | Admitting: Surgery

## 2021-12-12 VITALS — BP 128/79 | HR 66 | Resp 16 | Ht 69.5 in | Wt 207.0 lb

## 2021-12-12 DIAGNOSIS — I10 Essential (primary) hypertension: Secondary | ICD-10-CM | POA: Diagnosis not present

## 2021-12-12 DIAGNOSIS — Z952 Presence of prosthetic heart valve: Secondary | ICD-10-CM | POA: Diagnosis not present

## 2021-12-12 DIAGNOSIS — Z01818 Encounter for other preprocedural examination: Secondary | ICD-10-CM | POA: Insufficient documentation

## 2021-12-12 DIAGNOSIS — Z01812 Encounter for preprocedural laboratory examination: Secondary | ICD-10-CM

## 2021-12-12 DIAGNOSIS — Z951 Presence of aortocoronary bypass graft: Secondary | ICD-10-CM | POA: Diagnosis not present

## 2021-12-12 DIAGNOSIS — I71019 Dissection of thoracic aorta, unspecified: Secondary | ICD-10-CM

## 2021-12-12 HISTORY — DX: Other complications of anesthesia, initial encounter: T88.59XA

## 2021-12-12 HISTORY — DX: COVID-19: U07.1

## 2021-12-12 HISTORY — DX: Gastro-esophageal reflux disease without esophagitis: K21.9

## 2021-12-12 LAB — CBC
HCT: 42.4 % (ref 39.0–52.0)
Hemoglobin: 13.8 g/dL (ref 13.0–17.0)
MCH: 31.7 pg (ref 26.0–34.0)
MCHC: 32.5 g/dL (ref 30.0–36.0)
MCV: 97.5 fL (ref 80.0–100.0)
Platelets: 202 10*3/uL (ref 150–400)
RBC: 4.35 MIL/uL (ref 4.22–5.81)
RDW: 14.2 % (ref 11.5–15.5)
WBC: 6.5 10*3/uL (ref 4.0–10.5)
nRBC: 0 % (ref 0.0–0.2)

## 2021-12-12 LAB — URINALYSIS, ROUTINE W REFLEX MICROSCOPIC
Bacteria, UA: NONE SEEN
Bilirubin Urine: NEGATIVE
Glucose, UA: NEGATIVE mg/dL
Hgb urine dipstick: NEGATIVE
Ketones, ur: 5 mg/dL — AB
Leukocytes,Ua: NEGATIVE
Nitrite: NEGATIVE
Protein, ur: 30 mg/dL — AB
Specific Gravity, Urine: 1.026 (ref 1.005–1.030)
pH: 5 (ref 5.0–8.0)

## 2021-12-12 LAB — TYPE AND SCREEN
ABO/RH(D): O POS
Antibody Screen: NEGATIVE

## 2021-12-12 LAB — SURGICAL PCR SCREEN
MRSA, PCR: NEGATIVE
Staphylococcus aureus: POSITIVE — AB

## 2021-12-12 LAB — BASIC METABOLIC PANEL
Anion gap: 7 (ref 5–15)
BUN: 24 mg/dL — ABNORMAL HIGH (ref 8–23)
CO2: 26 mmol/L (ref 22–32)
Calcium: 9.4 mg/dL (ref 8.9–10.3)
Chloride: 107 mmol/L (ref 98–111)
Creatinine, Ser: 1.15 mg/dL (ref 0.61–1.24)
GFR, Estimated: >60 mL/min (ref >60)
Glucose, Bld: 86 mg/dL (ref 70–99)
Potassium: 4.3 mmol/L (ref 3.5–5.1)
Sodium: 140 mmol/L (ref 135–145)

## 2021-12-12 NOTE — Patient Instructions (Addendum)
Your procedure is scheduled on: 12/23/21 Report to [facility]. To find out your arrival time please call [phone] between 1PM - 3PM on 12/22/21.  Remember: Instructions that are not followed completely may result in serious medical risk, up to and including death, or upon the discretion of your surgeon and anesthesiologist your surgery may need to be rescheduled.     _X__ 1. Do not eat food after midnight the night before your procedure.                 No gum chewing or hard candies. You may drink clear liquids up to 2 hours                 before you are scheduled to arrive for your surgery- DO not drink clear                 liquids within 2 hours of the start of your surgery.                 Clear Liquids include:  water, apple juice without pulp, clear carbohydrate                 drink such as Clearfast or Gatorade, Black Coffee or Tea (Do not add                 anything to coffee or tea). Diabetics water only  Drink the Ensure "clear" pre surgery drink 2 hours prior to arrival for surgery  __X__2.  On the morning of surgery brush your teeth with toothpaste and water, you                 may rinse your mouth with mouthwash if you wish.  Do not swallow any              toothpaste of mouthwash.     _X__ 3.  No Alcohol for 24 hours before or after surgery.   _X__ 4.  Do Not Smoke or use e-cigarettes For 24 Hours Prior to Your Surgery.                 Do not use any chewable tobacco products for at least 6 hours prior to                 surgery.  ____  5.  Bring all medications with you on the day of surgery if instructed.   __X__  6.  Notify your doctor if there is any change in your medical condition      (cold, fever, infections).     Do not wear jewelry, make-up, hairpins, clips or nail polish. Do not wear lotions, powders, or perfumes. You may wear deodorant Do not shave body hair 48 hours prior to surgery. Men may  shave face and neck. Do not bring valuables to the hospital.    [facility] is not responsible for any belongings or valuables.  Contacts, dentures/partials or body piercings may not be worn into surgery. Bring a case for your contacts, glasses or hearing aids, a denture cup will be supplied. Leave your suitcase in the car. After surgery it may be brought to your room. For patients admitted to the hospital, discharge time is determined by your treatment team.   Patients discharged the day of surgery will not be allowed to drive home.   Please read over the following fact sheets that you were given:  MRSA Information, CHG soap, Incentive Spirometer, Ensure  __X__ Take these medicines the morning of surgery with A SIP OF WATER:    1. levothyroxine (SYNTHROID) 100 MCG table   2. metoprolol tartrate (LOPRESSOR) 12.5 MG table   3. pantoprazole (PROTONIX) 20 MG tablet   4.  5.  6.  ____ Fleet Enema (as directed)   __X__ Use CHG Soap/SAGE wipes as directed  ____ Use inhalers on the day of surgery  ____ Stop metformin/Janumet/Farxiga 2 days prior to surgery    ____ Take 1/2 of usual insulin dose the night before surgery. No insulin the morning          of surgery.   ____ Stop Blood Thinners Coumadin/Plavix/Xarelto/Pleta/Pradaxa/Eliquis/Effient/Aspirin  on   Or contact your Surgeon, Cardiologist or Medical Doctor regarding  ability to stop your blood thinners  __X__ Stop Anti-inflammatories 7 days before surgery such as Advil, Ibuprofen, Motrin,  BC or Goodies Powder, Naprosyn, Naproxen, Aleve, Aspirin   You may take Tylenol if needed  __X__ Stop all herbal supplements, fish oil or vitamins for 7 days  until after surgery.    ____ Bring C-Pap to the hospital.

## 2021-12-16 ENCOUNTER — Telehealth: Payer: Self-pay | Admitting: *Deleted

## 2021-12-16 NOTE — Telephone Encounter (Signed)
Pt agreeable to plan of care for tele pre op add on due to med hold time and procedure date. Med rec and consent are done.

## 2021-12-16 NOTE — Telephone Encounter (Signed)
Pt agreeable to plan of care for tele pre op add on due to med hold time and procedure date. Med rec and consent are done.     Patient Consent for Virtual Visit        Danny Odom has provided verbal consent on 12/16/2021 for a virtual visit (video or telephone).   CONSENT FOR VIRTUAL VISIT FOR:  Danny Odom  By participating in this virtual visit I agree to the following:  I hereby voluntarily request, consent and authorize [facility] and its employed or contracted physicians, physician assistants, nurse practitioners or other licensed health care professionals (the Practitioner), to provide me with telemedicine health care services (the "Services") as deemed necessary by the treating Practitioner. I acknowledge and consent to receive the Services by the Practitioner via telemedicine. I understand that the telemedicine visit will involve communicating with the Practitioner through live audiovisual communication technology and the disclosure of certain medical information by electronic transmission. I acknowledge that I have been given the opportunity to request an in-person assessment or other available alternative prior to the telemedicine visit and am voluntarily participating in the telemedicine visit.  I understand that I have the right to withhold or withdraw my consent to the use of telemedicine in the course of my care at any time, without affecting my right to future care or treatment, and that the Practitioner or I may terminate the telemedicine visit at any time. I understand that I have the right to inspect all information obtained and/or recorded in the course of the telemedicine visit and may receive copies of available information for a reasonable fee.  I understand that some of the potential risks of receiving the Services via telemedicine include:  Delay or interruption in medical evaluation due to technological equipment failure or disruption; Information  transmitted may not be sufficient (e.g. poor resolution of images) to allow for appropriate medical decision making by the Practitioner; and/or  In rare instances, security protocols could fail, causing a breach of personal health information.  Furthermore, I acknowledge that it is my responsibility to provide information about my medical history, conditions and care that is complete and accurate to the best of my ability. I acknowledge that Practitioner's advice, recommendations, and/or decision may be based on factors not within their control, such as incomplete or inaccurate data provided by me or distortions of diagnostic images or specimens that may result from electronic transmissions. I understand that the practice of medicine is not an exact science and that Practitioner makes no warranties or guarantees regarding treatment outcomes. I acknowledge that a copy of this consent can be made available to me via my patient portal ([facility]), or I can request a printed copy by calling the office of [facility].    I understand that my insurance will be billed for this visit.   I have read or had this consent read to me. I understand the contents of this consent, which adequately explains the benefits and risks of the Services being provided via telemedicine.  I have been provided ample opportunity to ask questions regarding this consent and the Services and have had my questions answered to my satisfaction. I give my informed consent for the services to be provided through the use of telemedicine in my medical care

## 2021-12-16 NOTE — Telephone Encounter (Signed)
-----   Message from Karen Kitchens, NP sent at 12/12/2021 10:12 AM EDT ----- Regarding: Request for pre-operative cardiac clearance Request for pre-operative cardiac clearance:  1. What type of surgery is being performed?  TOTAL KNEE ARTHROPLASTY  2. When is this surgery scheduled?  12/23/2021  3. Type of clearance being requested (medical, pharmacy, both)? MEDICAL   4. Are there any medications that need to be held prior to surgery? ASA  5. Practice name and name of physician performing surgery?  Performing surgeon: Dr. Milagros Evener, MD Requesting clearance: Honor Loh, FNP-C    6. Anesthesia type (none, local, MAC, general)? GENERAL  7. What is the office phone and fax number?   Phone: [phone] Fax: [phone]  ATTENTION: Unable to create telephone message as per your standard workflow. Directed by [facility] providers to send requests for cardiac clearance to this pool for appropriate distribution to provider covering pre-operative clearances.   Honor Loh, MSN, APRN, FNP-C, CEN [facility]  Peri-operative Services Nurse Practitioner Phone: [phone] 12/12/21 10:12 AM

## 2021-12-16 NOTE — Telephone Encounter (Signed)
   Name: Danny Odom  DOB: Jun 13, 1950  MRN: [id number]  Primary Cardiologist: [name], MD  Chart reviewed as part of pre-operative protocol coverage. Because of Danny Odom's past medical history and time since last visit, he will require a follow-up telephone visit in order to better assess preoperative cardiovascular risk.  Pre-op covering staff: - Please schedule appointment and call patient to inform them. If patient already had an upcoming appointment within acceptable timeframe, please add "pre-op clearance" to the appointment notes so provider is aware. - Please contact requesting surgeon's office via preferred method (i.e, phone, fax) to inform them of need for appointment prior to surgery.  Patient with history of CABG x 2, AVR, and aortic root replacement. Per office protocol can hold ASA x 7 days prior to his procedure. May resume when medically safe to do so.    Elgie Collard, PA-C  12/16/2021, 9:18 AM

## 2021-12-17 ENCOUNTER — Ambulatory Visit: Payer: Medicare Other | Attending: Cardiology | Admitting: General Practice

## 2021-12-17 DIAGNOSIS — Z01810 Encounter for preprocedural cardiovascular examination: Secondary | ICD-10-CM

## 2021-12-17 NOTE — Progress Notes (Signed)
Virtual Visit via Telephone Note   Because of Danny Odom's co-morbid illnesses, he is at least at moderate risk for complications without adequate follow up.  This format is felt to be most appropriate for this patient at this time.  The patient did not have access to video technology/had technical difficulties with video requiring transitioning to audio format only (telephone).  All issues noted in this document were discussed and addressed.  No physical exam could be performed with this format.  Please refer to the patient's chart for his consent to telehealth for [facility].  Evaluation Performed:  Preoperative cardiovascular risk assessment _____________   Date:  12/17/2021   Patient ID:  Danny Odom, DOB April 02, 1950, MRN [id number] Patient Location:  Home Provider location:   Office  Primary Care Provider:  Rusty Aus, MD Primary Cardiologist:  [name], MD  Chief Complaint / Patient Profile   71 y.o. y/o male with a h/o HTN, thoracic aortic dissection, aortic valve stenosis status post AVR, CAD status post CABG who is pending total knee arthroplasty and presents today for telephonic preoperative cardiovascular risk assessment.  Past Medical History    Past Medical History:  Diagnosis Date   Aortic stenosis 2014   08-2017  severe;   s/p  avr    Arthritis    back and joints   Cardiac murmur    Complication of anesthesia    ?cognitive decline   Coronary artery disease cardiologist-- dr Stanford Breed   10-01-2017  s/p  cabg x2   COVID    2023   ED (erectile dysfunction)    GERD (gastroesophageal reflux disease)    Headache    Hyperlipidemia    Hypertension    Hypothyroid    followed by pcp   Nocturia    Pneumonia    Primary prostate cancer with metastasis from prostate to other site [facility]) urologist-- dr Tresa Moore   first dx 08-01-2013;  Stage T3a, gleason 3+4;  s/p  radical prostatectomy w/ node dissection 10-20-2013  and completed post surgery  IMRT  04-19-2014   S/P aortic valve replacement 10/01/2017   edwards pericardial valve   S/P CABG x 2 10/01/2017   LIMA to Diagonal;   SVG to OM   S/P radiation therapy 03/05/2014 through 04/19/2014    Prostate bed 6600 cGy in 33 sessions, pelvic lymph nodes 5000 445 cGy in 33 sessions                           Wears glasses    Past Surgical History:  Procedure Laterality Date   AORTIC VALVE REPLACEMENT N/A 10/01/2017   Procedure: AORTIC VALVE REPLACEMENT (AVR);  Surgeon: Gaye Pollack, MD;  Location: [place];  Service: Open Heart Surgery;  Laterality: N/A;   ASCENDING AORTIC ROOT REPLACEMENT N/A 09/21/2018   Procedure: REPAIR OF ASCENDING AORTIC ANEURYSM USING 32 MM HEMASHIELD PLATINUM WOVEN DOUBLE VELOUR VASCULAR GRAFT WITH REATTACHMENT OF PREVIOUS SAPHENOUS VEIN GRAFT ;  Surgeon: [name], MD;  Location: [place];  Service: Open Heart Surgery;  Laterality: N/A;   COLONOSCOPY     COLONOSCOPY WITH PROPOFOL N/A 04/19/2019   Procedure: COLONOSCOPY WITH PROPOFOL;  Surgeon: Robert Bellow, MD;  Location: [facility];  Service: Gastroenterology;  Laterality: N/A;   CORONARY ARTERY BYPASS GRAFT N/A 10/01/2017   Procedure: CORONARY ARTERY BYPASS GRAFTING (CABG) x two, using left internal mammary artery and right leg greater saphenous vein harvested endoscopically;  Surgeon: Gaye Pollack, MD;  Location: [place];  Service: Open Heart Surgery;  Laterality: N/A;   CORONARY ARTERY BYPASS GRAFT N/A 09/21/2018   Procedure: OPEN RIGHT SAPHENOUS VEIN HARVEST;  Surgeon: [name], MD;  Location: [place];  Service: Open Heart Surgery;  Laterality: N/A;   LYMPHADENECTOMY Bilateral 10/20/2013   Procedure: BILATERAL PELVIC LYMPH NODE DISSECTION;  Surgeon: Alexis Frock, MD;  Location: [facility];  Service: Urology;  Laterality: Bilateral;   ORCHIECTOMY Bilateral 08/26/2018   Procedure: ORCHIECTOMY;  Surgeon: Alexis Frock, MD;  Location: [facility];  Service: Urology;  Laterality:  Bilateral;   REVERSE SHOULDER ARTHROPLASTY Left 03/06/2021   Procedure: REVERSE SHOULDER ARTHROPLASTY;  Surgeon: Corky Mull, MD;  Location: [facility];  Service: Orthopedics;  Laterality: Left;   RIGHT/LEFT HEART CATH AND CORONARY ANGIOGRAPHY N/A 08/23/2017   Procedure: RIGHT/LEFT HEART CATH AND CORONARY ANGIOGRAPHY;  Surgeon: Leonie Man, MD;  Location: [place] [facility];  Service: Cardiovascular;  Laterality: N/A;   ROBOT ASSISTED LAPAROSCOPIC RADICAL PROSTATECTOMY N/A 10/20/2013   Procedure: ROBOTIC ASSISTED LAPAROSCOPIC RADICAL PROSTATECTOMY WITH INDOCYANINE GREEN DYE;  Surgeon: Alexis Frock, MD;  Location: [facility];  Service: Urology;  Laterality: N/A;   TEE WITHOUT CARDIOVERSION N/A 10/01/2017   Procedure: TRANSESOPHAGEAL ECHOCARDIOGRAM (TEE);  Surgeon: Gaye Pollack, MD;  Location: [place];  Service: Open Heart Surgery;  Laterality: N/A;   TEE WITHOUT CARDIOVERSION N/A 09/21/2018   Procedure: TRANSESOPHAGEAL ECHOCARDIOGRAM (TEE);  Surgeon: [name], MD;  Location: [place];  Service: Open Heart Surgery;  Laterality: N/A;   TONSILLECTOMY  child   varicocele surgery  Right 1983 aaprx    Allergies  No Known Allergies  History of Present Illness    Danny Odom is a 71 y.o. male who presents via audio/video conferencing for a telehealth visit today.  Pt was last seen in cardiology clinic on 09/09/2021 by Dr. Stanford Breed.  At that time Danny Odom was doing well .  The patient is now pending procedure as outlined above. Since his last visit, he remains stable from a cardiac standpoint  Today he denies chest pain, shortness of breath, lower extremity edema, fatigue, palpitations, melena, hematuria, hemoptysis, diaphoresis, weakness, presyncope, syncope, orthopnea, and PND.   Home Medications    Prior to Admission medications   Medication Sig Start Date End Date Taking? Authorizing Provider  acetaminophen (TYLENOL) 500 MG tablet Take 500 mg by mouth every 8 (eight) hours as  needed for moderate pain.    [provider]  aspirin EC 81 MG tablet Take 81 mg by mouth daily. Swallow whole.    [provider]  atorvastatin (LIPITOR) 40 MG tablet Take 1 tablet (40 mg total) by mouth at bedtime. 09/28/18   [name], MD  doxycycline (MONODOX) 100 MG capsule Take 100 mg by mouth 2 (two) times daily.    [provider]  levothyroxine (SYNTHROID) 100 MCG tablet Take 100 mcg by mouth daily before breakfast.    [provider]  losartan (COZAAR) 50 MG tablet TAKE 1 TABLET BY MOUTH ONCE DAILY MUST  KEEP  APPOINTMENT  FOR  MORE  REFILLS 09/01/21   [name], MD  Methylcobalamin (B-12) 5000 MCG TBDP Take 5,000 mcg by mouth daily.    [provider]  metoprolol tartrate (LOPRESSOR) 25 MG tablet Take 0.5 tablets (12.5 mg total) by mouth 2 (two) times daily. 09/28/18   [name], MD  Multiple Vitamin (MULTIVITAMIN WITH MINERALS)  TABS tablet Take 1 tablet by mouth daily with breakfast. ONE-A-DAY MEN'S 50+    [provider]  pantoprazole (PROTONIX) 20 MG tablet Take 20 mg by mouth 2 (two) times daily.    [provider]  zinc gluconate 50 MG tablet Take 50 mg by mouth at bedtime.     [provider]    Physical Exam    Vital Signs:  NAYSON TRAWEEK does not have vital signs available for review today.  Given telephonic nature of communication, physical exam is limited. AAOx3. NAD. Normal affect.  Speech and respirations are unlabored.  Accessory Clinical Findings    None  Assessment & Plan    1.  Preoperative Cardiovascular Risk Assessment: Total knee arthroplasty, Dr. Milagros Evener    Primary Cardiologist: [name], MD  Chart reviewed as part of pre-operative protocol coverage. Given past medical history and time since last visit, based on ACC/AHA guidelines, ERIE RADU would be at acceptable risk for the planned procedure without further cardiovascular testing.   His RCRI  is a class II risk, 0.9% risk of major cardiac event.  He is able to complete greater than 4 METS of physical activity.  Patient with history of CABG x 2, AVR, and aortic root replacement. Per office protocol can hold ASA x 5-7 days prior to his procedure. May resume when medically safe to do so.   Patient was advised that if he develops new symptoms prior to surgery to contact our office to arrange a follow-up appointment.  He verbalized understanding.  I will route this recommendation to the requesting party via Epic fax function and remove from pre-op pool.     Time:   Today, I have spent 7 minutes with the patient with telehealth technology discussing medical history, symptoms, and management plan.  Prior to his phone evaluation I spent greater than 10 minutes reviewing his past medical history and cardiac medications.   Deberah Pelton, NP  12/17/2021, 8:44 AM

## 2021-12-17 NOTE — Progress Notes (Signed)
CLEARANCE NOTES FAXED TO DR. POGGI

## 2021-12-18 ENCOUNTER — Encounter: Payer: Self-pay | Admitting: Surgery

## 2021-12-19 ENCOUNTER — Encounter: Payer: Self-pay | Admitting: Surgery

## 2021-12-19 NOTE — Progress Notes (Signed)
Perioperative Services  Pre-Admission/Anesthesia Testing Clinical Review  Date: 12/19/21  Patient Demographics:  Name: Danny Odom DOB:   May 03, 1950 MRN:   [id number]  Planned Surgical Procedure(s):    Case: [id number] Date/Time: 12/23/21 0715   Procedure: TOTAL KNEE ARTHROPLASTY (Right: Knee)   Anesthesia type: Choice   Pre-op diagnosis: Primary osteoarthritis of right knee M17.11   Location: [facility] OR ROOM 03 / [place] [facility]   Surgeons: Corky Mull, MD   NOTE: Available PAT nursing documentation and vital signs have been reviewed. Clinical nursing staff has updated patient's PMH/PSHx, current medication list, and drug allergies/intolerances to ensure comprehensive history available to assist in medical decision making as it pertains to the aforementioned surgical procedure and anticipated anesthetic course. Extensive review of available clinical information performed. [place] PMH and PSHx updated with any diagnoses/procedures that  may have been inadvertently omitted during his intake with the pre-admission testing department's nursing staff.  Clinical Discussion:  Danny Odom is a 71 y.o. male who is submitted for pre-surgical anesthesia review and clearance prior to him undergoing the above procedure. Patient is a Former Smoker (20 pack years; quit 02/1983). Pertinent PMH includes: CAD (s/p CABG), aortic stenosis (s/p AVR), aortic arch pseudoaneurysm, cardiac murmur, HTN, HLD, hypothyroidism, GERD (on daily PPI).  Patient is followed by cardiology Stanford Breed, MD). He was last seen in the cardiology clinic on 09/09/2021; notes reviewed. At the time of his clinic visit, the patient denied any chest pain, shortness of breath, PND, orthopnea, palpitations, significant peripheral edema, vertiginous symptoms, or presyncope/syncope.  Patient with a PMH significant for cardiovascular diagnoses.  Patient with a history of significant aortic valve stenosis.  Transvalvular  gradients and valve area has been monitored since diagnosis.  TTE on 08/09/2017 revealed progression of patient's aortic valve stenosis.  Transvalvular gradient at that time was 30 mmHg with an aortic valve area (VTI) of 1.4 cm.  Patient ultimately underwent AVR on 08/23/2017 placing a #25 Edwards Resilia Inspiris pericardial tissue valve.  Patient underwent diagnostic RIGHT/LEFT heart catheterization on 08/23/2017 revealing a normal left ventricular systolic function with an EF of >65%.  LVEDP 23 mmHg.  There was multivessel CAD; 95% ostial D1 with a 90% side branch in lateral D1, 45% mid LCx, 75% mid to distal LCx, and 10% proximal to mid LAD.  Given the complexity of his disease, patient was not felt to be amenable to PCI.  He was referred to CVTS for consultation regarding revascularization.  Patient underwent three-vessel CABG procedure on 10/01/2017.  LIMA-D1 and SVG-OM bypass grafts were placed.  Most recent TTE was performed on 09/20/2018 revealing a hyperdynamic ventricular systolic function with an EF of >65%.  There were no regional wall motion abnormalities.  Right ventricular size and function normal.  Left atrium mildly dilated.  There was mild calcification of the mitral valve leaflets, however no stenosis noted.  Bioprosthetic aortic valve noted to be and a well-seated position and functioning properly.;  No residual stenosis noted.  Severe aortic root dilatation with suggestion of dissection flap seen in the ascending root.  Patient was seen in consult by CVTS.  Patient had developed an aortic arch pseudoaneurysm.  He was taken back to the OR for a redo sternotomy and repair on 09/22/2018;  32 mm Hemashield platinum woven double velour vascular graft was placed.  Blood pressure reasonably controlled at 130/72 mmHg on currently prescribed ARB (losartan) and beta-blocker (metoprolol tartrate) therapies.  Patient is on atorvastatin for his HLD diagnosis  and further ASCVD prevention.  He is  nondiabetic. Patient does not have an OSAH diagnosis. Functional capacity, as defined by DASI, is documented as being >/= 4 METS.  No changes were made to his medication regimen.  Patient to follow-up with outpatient cardiology in 1 year or sooner if needed.  Danny Odom is scheduled for an elective RIGHT TOTAL KNEE ARTHROPLASTY on 12/23/2021 with Dr Milagros Evener, MD.  Given patient's past medical history significant for cardiovascular diagnoses, presurgical cardiac clearance was sought by the PAT team.  Per cardiology, "his RCRI is a class II risk; 0.9% risk of MACE.  He is able to complete >4 METS of physical activity.  Therefore, based on ACC/AHA guidelines, patient would be at an overall ACCEPTABLE risk for the planned procedure without further cardiovascular testing". In review of his medication reconciliation, it is noted that patient is currently on prescribed daily antiplatelet therapy. He has been instructed on recommendations for holding his daily low-dose ASA for 7 days prior to his procedure with plans to restart as soon as postoperative bleeding risk felt to be minimized by his attending surgeon. The patient has been instructed that his last dose of his ASA will be on 12/15/2021.  Patient reports previous perioperative complications with anesthesia in the past. Following anesthesia, patient reported that he suffers "cognitive decline". In review of the available records, it is noted that patient underwent a general anesthetic course here at [facility] ([facility]) in 02/2021 without documented complications.      12/12/2021   10:08 AM 09/09/2021    7:37 AM 03/06/2021    4:27 PM  Vitals with BMI  Height 5' 9.5" '5\' 10"'$    Weight 207 lbs 205 lbs   BMI 16.96 78.93   Systolic 810 175 102  Diastolic 79 72 87  Pulse 66 60 72    Providers/Specialists:   NOTE: Primary physician provider listed below. Patient may have been seen by APP or partner within same  practice.   PROVIDER ROLE / SPECIALTY LAST OV  Poggi, Marshall Cork, MD Orthopedics (Surgeon) 12/17/2021  Rusty Aus, MD Primary Care Provider 10/08/2021  [name], MD Cardiology 09/09/2021  Wallene Huh, MD Pulmonary Medicine 12/10/2021   Allergies:  Patient has no known allergies.  Current Home Medications:   No current facility-administered medications for this encounter.    acetaminophen (TYLENOL) 500 MG tablet   aspirin EC 81 MG tablet   atorvastatin (LIPITOR) 40 MG tablet   doxycycline (MONODOX) 100 MG capsule   levothyroxine (SYNTHROID) 100 MCG tablet   losartan (COZAAR) 50 MG tablet   Methylcobalamin (B-12) 5000 MCG TBDP   metoprolol tartrate (LOPRESSOR) 25 MG tablet   Multiple Vitamin (MULTIVITAMIN WITH MINERALS) TABS tablet   pantoprazole (PROTONIX) 20 MG tablet   zinc gluconate 50 MG tablet   History:   Past Medical History:  Diagnosis Date   Aortic arch pseudoaneurysm (HCC)    a.) s/p re-do sternotomy 09/22/2018 - 32 mm HemaShield Platnium woven double velour vascular graft   Aortic stenosis    a.) TTE 10/04/2015: mod AS (MPG 21); b.) TTE 10/11/2015: mod AS (MPG 28); c.) TTE 09/22/2016: mod AS (MPG 22); d.) TTE 08/09/2017: mod AS (MPG 30); e.) R/LHC 08/23/2017: mod AS (29.4); f.) s/p AVR (#25 Edwards Resilia Inspiris)   Arthritis    Cardiac murmur    Complication of anesthesia    ?cognitive decline   Coronary artery disease 08/23/2017   a.) R/LHC 08/23/2017: EF >  65%, LVEDP 23, 95% oD1 with 90% side branch in lateral D1, 45% mLCx, 75% m-dLCx, 10% p-mLAD; b.) s/p 2v CABG 10/01/2017 (LIMA-D1, SVG-OM)   ED (erectile dysfunction)    GERD (gastroesophageal reflux disease)    Headache    History of 2019 novel coronavirus disease (COVID-19) 2023   Hyperlipidemia    Hypertension    Hypothyroidism    Nocturia    Pneumonia    Primary prostate cancer with metastasis from prostate to other site [facility] 08/01/2013   a.) stage T3a, gleason 3+4;  s/p radical  prostatectomy w/ node dissection 10/20/2013; b.) s/p adjuvant IMRT  03/05/2014 - 03/10/2016completed 04/10/2014)04-19-2014   S/P aortic valve replacement 10/01/2017   a.) #25 Edwards Resilia Inspiris pericardial tissue valve   S/P CABG x 2 10/01/2017   a.) LIMA-diagonal, SVG-OM   S/P radiation therapy 03/05/2014 through 04/19/2014    Prostate bed 6600 cGy in 33 sessions, pelvic lymph nodes 5000 445 cGy in 33 sessions                           Wears glasses    Past Surgical History:  Procedure Laterality Date   AORTIC VALVE REPLACEMENT N/A 10/01/2017   Procedure: AORTIC VALVE REPLACEMENT (AVR);  Surgeon: Gaye Pollack, MD;  Location: [place];  Service: Open Heart Surgery;  Laterality: N/A;   ASCENDING AORTIC ROOT REPLACEMENT N/A 09/21/2018   Procedure: REPAIR OF ASCENDING AORTIC ANEURYSM USING 32 MM HEMASHIELD PLATINUM WOVEN DOUBLE VELOUR VASCULAR GRAFT WITH REATTACHMENT OF PREVIOUS SAPHENOUS VEIN GRAFT ;  Surgeon: [name], MD;  Location: [place];  Service: Open Heart Surgery;  Laterality: N/A;   COLONOSCOPY     COLONOSCOPY WITH PROPOFOL N/A 04/19/2019   Procedure: COLONOSCOPY WITH PROPOFOL;  Surgeon: Robert Bellow, MD;  Location: [facility] END[facility]COPY;  Service: Gastroenterology;  Laterality: N/A;   CORONARY ARTERY BYPASS GRAFT N/A 10/01/2017   Procedure: CORONARY ARTERY BYPASS GRAFTING (CABG) x two, using left internal mammary artery and right leg greater saphenous vein harvested endoscopically;  Surgeon: Gaye Pollack, MD;  Location: [facility];  Service: Open Heart Surgery;  Laterality: N/A;   CORONARY ARTERY BYPASS GRAFT N/A 09/21/2018   Procedure: OPEN RIGHT SAPHENOUS VEIN HARVEST;  Surgeon: [name], MD;  Location: [place];  Service: Open Heart Surgery;  Laterality: N/A;   LYMPHADENECTOMY Bilateral 10/20/2013   Procedure: BILATERAL PELVIC LYMPH NODE DISSECTION;  Surgeon: Alexis Frock, MD;  Location: [facility];  Service: Urology;  Laterality: Bilateral;   ORCHIECTOMY Bilateral  08/26/2018   Procedure: ORCHIECTOMY;  Surgeon: Alexis Frock, MD;  Location: [facility];  Service: Urology;  Laterality: Bilateral;   REVERSE SHOULDER ARTHROPLASTY Left 03/06/2021   Procedure: REVERSE SHOULDER ARTHROPLASTY;  Surgeon: Corky Mull, MD;  Location: [facility];  Service: Orthopedics;  Laterality: Left;   RIGHT/LEFT HEART CATH AND CORONARY ANGIOGRAPHY N/A 08/23/2017   Procedure: RIGHT/LEFT HEART CATH AND CORONARY ANGIOGRAPHY;  Surgeon: Leonie Man, MD;  Location: [place] [facility];  Service: Cardiovascular;  Laterality: N/A;   ROBOT ASSISTED LAPAROSCOPIC RADICAL PROSTATECTOMY N/A 10/20/2013   Procedure: ROBOTIC ASSISTED LAPAROSCOPIC RADICAL PROSTATECTOMY WITH INDOCYANINE GREEN DYE;  Surgeon: Alexis Frock, MD;  Location: [facility];  Service: Urology;  Laterality: N/A;   TEE WITHOUT CARDIOVERSION N/A 10/01/2017   Procedure: TRANSESOPHAGEAL ECHOCARDIOGRAM (TEE);  Surgeon: Gaye Pollack, MD;  Location: [place];  Service: Open Heart Surgery;  Laterality: N/A;   TEE WITHOUT CARDIOVERSION N/A  09/21/2018   Procedure: TRANSESOPHAGEAL ECHOCARDIOGRAM (TEE);  Surgeon: [name], MD;  Location: [place];  Service: Open Heart Surgery;  Laterality: N/A;   TONSILLECTOMY  child   VARICOCELECTOMY  1983   Family History  Problem Relation Age of Onset   Emphysema Father    Prostate cancer Father    Breast cancer Mother    Social History   Tobacco Use   Smoking status: Former    Packs/day: 1.00    Years: 20.00    Total pack years: 20.00    Types: Cigarettes    Quit date: 02/15/1983    Years since quitting: 38.8   Smokeless tobacco: Never  Vaping Use   Vaping Use: Never used  Substance Use Topics   Alcohol use: Not Currently    Alcohol/week: 0.0 standard drinks of alcohol    Comment: rarely    Drug use: Never    Pertinent Clinical Results:  LABS: Labs reviewed: Acceptable for surgery.  No visits with results within 3 Day(s) from this visit.  Latest known  visit with results is:  Hospital Outpatient Visit on 12/12/2021  Component Date Value Ref Range Status   Color, Urine 12/12/2021 YELLOW (A)  YELLOW Final   APPearance 12/12/2021 HAZY (A)  CLEAR Final   Specific Gravity, Urine 12/12/2021 1.026  1.005 - 1.030 Final   pH 12/12/2021 5.0  5.0 - 8.0 Final   Glucose, UA 12/12/2021 NEGATIVE  NEGATIVE mg/dL Final   Hgb urine dipstick 12/12/2021 NEGATIVE  NEGATIVE Final   Bilirubin Urine 12/12/2021 NEGATIVE  NEGATIVE Final   Ketones, ur 12/12/2021 5 (A)  NEGATIVE mg/dL Final   Protein, ur 12/12/2021 30 (A)  NEGATIVE mg/dL Final   Nitrite 12/12/2021 NEGATIVE  NEGATIVE Final   Leukocytes,Ua 12/12/2021 NEGATIVE  NEGATIVE Final   RBC / HPF 12/12/2021 0-5  0 - 5 RBC/hpf Final   WBC, UA 12/12/2021 0-5  0 - 5 WBC/hpf Final   Bacteria, UA 12/12/2021 NONE SEEN  NONE SEEN Final   Squamous Epithelial / LPF 12/12/2021 0-5  0 - 5 Final   Mucus 12/12/2021 PRESENT   Final   Performed at [facility], [address] [place], [place] [address]   ABO/RH(D) 12/12/2021 O POS   Final   Antibody Screen 12/12/2021 NEG   Final   Sample Expiration 12/12/2021 12/26/2021,2359   Final   Extend sample reason 12/12/2021    Final                   Value:NO TRANSFUSIONS OR PREGNANCY IN THE PAST 3 MONTHS Performed at [facility], [address] [place], [place] [address]    MRSA, PCR 12/12/2021 NEGATIVE  NEGATIVE Final   Staphylococcus aureus 12/12/2021 POSITIVE (A)  NEGATIVE Final   Comment: (NOTE) The Xpert SA Assay (FDA approved for NASAL specimens in patients 39 years of age and older), is one component of a comprehensive surveillance program. It is not intended to diagnose infection nor to guide or monitor treatment. Performed at [facility], [address] [place], [place] [address]    WBC 12/12/2021 6.5  4.0 - 10.5 K/uL Final   RBC 12/12/2021 4.35  4.22 - 5.81 MIL/uL Final   Hemoglobin 12/12/2021 13.8  13.0 - 17.0 g/dL Final    HCT 12/12/2021 42.4  39.0 - 52.0 % Final   MCV 12/12/2021 97.5  80.0 - 100.0 fL Final   MCH 12/12/2021 31.7  26.0 - 34.0 pg Final   MCHC 12/12/2021  32.5  30.0 - 36.0 g/dL Final   RDW 12/12/2021 14.2  11.5 - 15.5 % Final   Platelets 12/12/2021 202  150 - 400 K/uL Final   nRBC 12/12/2021 0.0  0.0 - 0.2 % Final   Performed at [facility], [place], [place] [address]   Sodium 12/12/2021 140  135 - 145 mmol/L Final   Potassium 12/12/2021 4.3  3.5 - 5.1 mmol/L Final   Chloride 12/12/2021 107  98 - 111 mmol/L Final   CO2 12/12/2021 26  22 - 32 mmol/L Final   Glucose, Bld 12/12/2021 86  70 - 99 mg/dL Final   Glucose reference range applies only to samples taken after fasting for at least 8 hours.   BUN 12/12/2021 24 (H)  8 - 23 mg/dL Final   Creatinine, Ser 12/12/2021 1.15  0.61 - 1.24 mg/dL Final   Calcium 12/12/2021 9.4  8.9 - 10.3 mg/dL Final   GFR, Estimated 12/12/2021 >60  >60 mL/min Final   Comment: (NOTE) Calculated using the CKD-EPI Creatinine Equation (2021)    Anion gap 12/12/2021 7  5 - 15 Final   Performed at [facility], [address] [place], [place] [address]    ECG: Date: 12/12/2021 Time ECG obtained: 1056 AM Rate: 56 bpm Rhythm: Sinus bradycardia Axis (leads I and aVF): Normal Intervals: PR 148 ms. QRS 96 ms. QTc 411 ms. ST segment and T wave changes: Anterolateral T wave abnormalities.  Evidence of an age undetermined anterior infarct present Comparison: Similar to previous tracing obtained on 02/25/2021   IMAGING / PROCEDURES: CT ANGIO CHEST AORTA W &/OR WO CONTRAST performed on 10/07/2021 Stable appearance of AVR and ascending thoracic aortic repair Stable 3.7 cm negative aortic arch aneurysm.  Recommend annual imaging follow-up by CT aortic MRA.  TRANSTHORACIC ECHOCARDIOGRAM performed on 09/20/2018 The left ventricle has hyperdynamic systolic function, with an ejection fraction of >65%. The cavity size was normal. There is  mildly increased left ventricular wall thickness. Left ventricular diastolic parameters were normal.  The right ventricle has normal systolic function. The cavity was normal. There is no increase in right ventricular wall thickness.   Left atrial size was mildly dilated.  Mild thickening of the mitral valve leaflet. Mild calcification of the mitral valve leaflet.  A bovine bioprosthesis valve is present in the aortic position. Procedure Date: 2019.  No stenosis of the aortic valve.  25 mm Inspiris bioprosthetic valve not well visualized. No PVL and gradients not particularly elevated.  The aorta is abnormal in size and structure.  Severe ascending aortic root dilatation with suggestion of dissection flap seen in ascending root.  The interatrial septum was not well visualized.   RIGHT/LEFT HEART CATHETERIZATION AND CORONARY ANGIOGRAPHY performed on 08/23/2017 Normal left ventricular systolic function with an EF of >65% LVEDP moderately elevated at approximately 23 mmHg Aortic valve stenosis with a mean transvalvular gradient of 29.4 mmHg  Multivessel CAD 95% ostial D1 90% side branch and lateral D1 45% mid LCx  75% mid to distal LCx 10% proximal to mid LAD Recommendations: refer to CVTS   Impression and Plan:  Danny Odom has been referred for pre-anesthesia review and clearance prior to him undergoing the planned anesthetic and procedural courses. Available labs, pertinent testing, and imaging results were personally reviewed by me. This patient has been appropriately cleared by cardiology with an overall ACCEPTABLE risk of significant perioperative cardiovascular complications.  Based on clinical review performed today (12/19/21), barring any significant acute  changes in the patient's overall condition, it is anticipated that he will be able to proceed with the planned surgical intervention. Any acute changes in clinical condition may necessitate his procedure being postponed and/or  cancelled. Patient will meet with anesthesia team (MD and/or CRNA) on the day of his procedure for preoperative evaluation/assessment. Questions regarding anesthetic course will be fielded at that time.   Pre-surgical instructions were reviewed with the patient during his PAT appointment and questions were fielded by PAT clinical staff. Patient was advised that if any questions or concerns arise prior to his procedure then he should return a call to PAT and/or his surgeon's office to discuss.  Honor Loh, MSN, APRN, FNP-C, CEN [facility]  Peri-operative Services Nurse Practitioner Phone: [phone] Fax: [phone] 12/19/21 1:15 PM  NOTE: This note has been prepared using Dragon dictation software. Despite my best ability to proofread, there is always the potential that unintentional transcriptional errors may still occur from this process.

## 2021-12-23 ENCOUNTER — Ambulatory Visit: Payer: Medicare Other

## 2021-12-23 ENCOUNTER — Other Ambulatory Visit: Payer: Self-pay

## 2021-12-23 ENCOUNTER — Encounter: Payer: Self-pay | Admitting: Surgery

## 2021-12-23 ENCOUNTER — Ambulatory Visit: Payer: Medicare Other | Admitting: Urgent Care

## 2021-12-23 ENCOUNTER — Ambulatory Visit
Admission: RE | Admit: 2021-12-23 | Discharge: 2021-12-23 | Disposition: A | Discharge status: Home / Self Care | Payer: Medicare Other | Attending: Surgery | Admitting: Surgery

## 2021-12-23 ENCOUNTER — Encounter
Admission: RE | Disposition: A | Discharge status: Home / Self Care | Payer: Self-pay | Source: Home / Self Care | Attending: Surgery

## 2021-12-23 DIAGNOSIS — K219 Gastro-esophageal reflux disease without esophagitis: Secondary | ICD-10-CM | POA: Diagnosis not present

## 2021-12-23 DIAGNOSIS — E785 Hyperlipidemia, unspecified: Secondary | ICD-10-CM | POA: Insufficient documentation

## 2021-12-23 DIAGNOSIS — Z8616 Personal history of COVID-19: Secondary | ICD-10-CM | POA: Insufficient documentation

## 2021-12-23 DIAGNOSIS — Z951 Presence of aortocoronary bypass graft: Secondary | ICD-10-CM | POA: Insufficient documentation

## 2021-12-23 DIAGNOSIS — E039 Hypothyroidism, unspecified: Secondary | ICD-10-CM | POA: Insufficient documentation

## 2021-12-23 DIAGNOSIS — I2510 Atherosclerotic heart disease of native coronary artery without angina pectoris: Secondary | ICD-10-CM | POA: Insufficient documentation

## 2021-12-23 DIAGNOSIS — M1711 Unilateral primary osteoarthritis, right knee: Secondary | ICD-10-CM | POA: Diagnosis not present

## 2021-12-23 DIAGNOSIS — Z7982 Long term (current) use of aspirin: Secondary | ICD-10-CM | POA: Insufficient documentation

## 2021-12-23 DIAGNOSIS — R2689 Other abnormalities of gait and mobility: Secondary | ICD-10-CM | POA: Insufficient documentation

## 2021-12-23 DIAGNOSIS — I10 Essential (primary) hypertension: Secondary | ICD-10-CM | POA: Insufficient documentation

## 2021-12-23 DIAGNOSIS — I350 Nonrheumatic aortic (valve) stenosis: Secondary | ICD-10-CM | POA: Diagnosis not present

## 2021-12-23 DIAGNOSIS — Z9079 Acquired absence of other genital organ(s): Secondary | ICD-10-CM | POA: Diagnosis not present

## 2021-12-23 DIAGNOSIS — Z952 Presence of prosthetic heart valve: Secondary | ICD-10-CM | POA: Diagnosis not present

## 2021-12-23 HISTORY — PX: TOTAL KNEE ARTHROPLASTY: SHX125

## 2021-12-23 HISTORY — DX: Aneurysm of the aortic arch, without rupture: I71.22

## 2021-12-23 HISTORY — DX: Hypothyroidism, unspecified: E03.9

## 2021-12-23 SURGERY — ARTHROPLASTY, KNEE, TOTAL
Anesthesia: Spinal | Site: Knee | Laterality: Right

## 2021-12-23 MED ORDER — TRANEXAMIC ACID 1000 MG/10ML IV SOLN
INTRAVENOUS | Status: DC | PRN
  Administered 2021-12-23: 1000 mg via TOPICAL

## 2021-12-23 MED ORDER — DEXAMETHASONE SODIUM PHOSPHATE 10 MG/ML IJ SOLN
INTRAMUSCULAR | Status: AC
  Filled 2021-12-23: qty 1

## 2021-12-23 MED ORDER — BUPIVACAINE HCL (PF) 0.5 % IJ SOLN
INTRAMUSCULAR | Status: DC | PRN
  Administered 2021-12-23: 15 mg via INTRATHECAL

## 2021-12-23 MED ORDER — PROPOFOL 1000 MG/100ML IV EMUL
INTRAVENOUS | Status: AC
  Filled 2021-12-23: qty 100

## 2021-12-23 MED ORDER — OXYCODONE HCL 5 MG PO TABS: 5.0000 mg | ORAL_TABLET | ORAL | Status: DC | PRN

## 2021-12-23 MED ORDER — SODIUM CHLORIDE FLUSH 0.9 % IV SOLN
INTRAVENOUS | Status: AC
  Filled 2021-12-23: qty 40

## 2021-12-23 MED ORDER — DEXAMETHASONE SODIUM PHOSPHATE 4 MG/ML IJ SOLN
INTRAMUSCULAR | Status: DC | PRN
  Administered 2021-12-23: 4 mg via INTRAVENOUS

## 2021-12-23 MED ORDER — PHENYLEPHRINE 80 MCG/ML (10ML) SYRINGE FOR IV PUSH (FOR BLOOD PRESSURE SUPPORT)
PREFILLED_SYRINGE | INTRAVENOUS | Status: AC
  Filled 2021-12-23: qty 10

## 2021-12-23 MED ORDER — KETOROLAC TROMETHAMINE 15 MG/ML IJ SOLN
INTRAMUSCULAR | Status: AC
  Filled 2021-12-23: qty 1

## 2021-12-23 MED ORDER — BUPIVACAINE-EPINEPHRINE (PF) 0.5% -1:200000 IJ SOLN
INTRAMUSCULAR | Status: AC
  Filled 2021-12-23: qty 30

## 2021-12-23 MED ORDER — FENTANYL CITRATE (PF) 100 MCG/2ML IJ SOLN
INTRAMUSCULAR | Status: AC
  Filled 2021-12-23: qty 2

## 2021-12-23 MED ORDER — METOCLOPRAMIDE HCL 10 MG PO TABS: 5.0000 mg | ORAL_TABLET | Freq: Three times a day (TID) | ORAL | Status: DC | PRN

## 2021-12-23 MED ORDER — ONDANSETRON HCL 4 MG/2ML IJ SOLN
INTRAMUSCULAR | Status: AC
  Filled 2021-12-23: qty 2

## 2021-12-23 MED ORDER — FENTANYL CITRATE (PF) 100 MCG/2ML IJ SOLN
INTRAMUSCULAR | Status: DC | PRN
  Administered 2021-12-23 (×2): 25 ug via INTRAVENOUS
  Administered 2021-12-23: 50 ug via INTRAVENOUS

## 2021-12-23 MED ORDER — PHENYLEPHRINE HCL (PRESSORS) 10 MG/ML IV SOLN
INTRAVENOUS | Status: DC | PRN
  Administered 2021-12-23 (×2): 40 ug via INTRAVENOUS

## 2021-12-23 MED ORDER — LACTATED RINGERS IV SOLN: INTRAVENOUS | Status: DC

## 2021-12-23 MED ORDER — PROPOFOL 10 MG/ML IV BOLUS
INTRAVENOUS | Status: DC | PRN
  Administered 2021-12-23: 20 mg via INTRAVENOUS

## 2021-12-23 MED ORDER — EPHEDRINE 5 MG/ML INJ
INTRAVENOUS | Status: AC
  Filled 2021-12-23: qty 5

## 2021-12-23 MED ORDER — BUPIVACAINE LIPOSOME 1.3 % IJ SUSP
INTRAMUSCULAR | Status: AC
  Filled 2021-12-23: qty 20

## 2021-12-23 MED ORDER — TRIAMCINOLONE ACETONIDE 40 MG/ML IJ SUSP
INTRAMUSCULAR | Status: DC | PRN
  Administered 2021-12-23: 80 mg via INTRAMUSCULAR

## 2021-12-23 MED ORDER — CEFAZOLIN SODIUM-DEXTROSE 2-4 GM/100ML-% IV SOLN: 2.0000 g | Freq: Four times a day (QID) | INTRAVENOUS | Status: DC

## 2021-12-23 MED ORDER — LOSARTAN POTASSIUM 50 MG PO TABS: 50.0000 mg | ORAL_TABLET | Freq: Every day | ORAL | Status: DC

## 2021-12-23 MED ORDER — PHENYLEPHRINE HCL-NACL 20-0.9 MG/250ML-% IV SOLN
INTRAVENOUS | Status: DC | PRN
  Administered 2021-12-23: 10 ug/min via INTRAVENOUS

## 2021-12-23 MED ORDER — KETOROLAC TROMETHAMINE 15 MG/ML IJ SOLN: 7.5000 mg | Freq: Four times a day (QID) | INTRAMUSCULAR | Status: DC

## 2021-12-23 MED ORDER — SODIUM CHLORIDE 0.9 % BOLUS PEDS
250.0000 mL | Freq: Once | INTRAVENOUS | Status: AC
  Administered 2021-12-23: 250 mL via INTRAVENOUS

## 2021-12-23 MED ORDER — TRANEXAMIC ACID 1000 MG/10ML IV SOLN
INTRAVENOUS | Status: AC
  Filled 2021-12-23: qty 10

## 2021-12-23 MED ORDER — APIXABAN 2.5 MG PO TABS: 2.5000 mg | ORAL_TABLET | Freq: Two times a day (BID) | ORAL | 0 refills | Status: DC

## 2021-12-23 MED ORDER — ACETAMINOPHEN 500 MG PO TABS: 1000.0000 mg | ORAL_TABLET | Freq: Four times a day (QID) | ORAL | Status: DC

## 2021-12-23 MED ORDER — SODIUM CHLORIDE 0.9 % IR SOLN
Status: DC | PRN
  Administered 2021-12-23: 3000 mL

## 2021-12-23 MED ORDER — CHLORHEXIDINE GLUCONATE 0.12 % MT SOLN: 15.0000 mL | Freq: Once | OROMUCOSAL | Status: AC

## 2021-12-23 MED ORDER — ONDANSETRON HCL 4 MG/2ML IJ SOLN: 4.0000 mg | Freq: Four times a day (QID) | INTRAMUSCULAR | Status: DC | PRN

## 2021-12-23 MED ORDER — ZINC SULFATE 220 (50 ZN) MG PO CAPS: 220.0000 mg | ORAL_CAPSULE | Freq: Every day | ORAL | Status: DC

## 2021-12-23 MED ORDER — METOCLOPRAMIDE HCL 5 MG/ML IJ SOLN: 5.0000 mg | Freq: Three times a day (TID) | INTRAMUSCULAR | Status: DC | PRN

## 2021-12-23 MED ORDER — METOPROLOL TARTRATE 25 MG PO TABS: 12.5000 mg | ORAL_TABLET | Freq: Two times a day (BID) | ORAL | Status: DC

## 2021-12-23 MED ORDER — 0.9 % SODIUM CHLORIDE (POUR BTL) OPTIME
TOPICAL | Status: DC | PRN
  Administered 2021-12-23: 500 mL

## 2021-12-23 MED ORDER — SODIUM CHLORIDE 0.9 % IV SOLN: INTRAVENOUS | Status: DC

## 2021-12-23 MED ORDER — ONDANSETRON HCL 4 MG/2ML IJ SOLN
INTRAMUSCULAR | Status: DC | PRN
  Administered 2021-12-23: 4 mg via INTRAVENOUS

## 2021-12-23 MED ORDER — LEVOTHYROXINE SODIUM 50 MCG PO TABS: 100.0000 ug | ORAL_TABLET | Freq: Every day | ORAL | Status: DC

## 2021-12-23 MED ORDER — OXYCODONE HCL 5 MG PO TABS
ORAL_TABLET | ORAL | Status: AC
  Filled 2021-12-23: qty 1

## 2021-12-23 MED ORDER — LIDOCAINE HCL (PF) 2 % IJ SOLN
INTRAMUSCULAR | Status: AC
  Filled 2021-12-23: qty 5

## 2021-12-23 MED ORDER — ORAL CARE MOUTH RINSE: 15.0000 mL | Freq: Once | OROMUCOSAL | Status: AC

## 2021-12-23 MED ORDER — KETOROLAC TROMETHAMINE 15 MG/ML IJ SOLN
15.0000 mg | Freq: Once | INTRAMUSCULAR | Status: AC
  Administered 2021-12-23: 15 mg via INTRAVENOUS

## 2021-12-23 MED ORDER — KETOROLAC TROMETHAMINE 30 MG/ML IJ SOLN
INTRAMUSCULAR | Status: DC | PRN
  Administered 2021-12-23: 30 mg via INTRAMUSCULAR

## 2021-12-23 MED ORDER — CHLORHEXIDINE GLUCONATE 0.12 % MT SOLN
OROMUCOSAL | Status: AC
  Administered 2021-12-23: 15 mL via OROMUCOSAL
  Filled 2021-12-23: qty 15

## 2021-12-23 MED ORDER — FENTANYL CITRATE (PF) 100 MCG/2ML IJ SOLN: 25.0000 ug | INTRAMUSCULAR | Status: DC | PRN

## 2021-12-23 MED ORDER — SODIUM CHLORIDE 0.9 % IV SOLN
INTRAVENOUS | Status: DC | PRN
  Administered 2021-12-23: 60 mL

## 2021-12-23 MED ORDER — CEFAZOLIN SODIUM-DEXTROSE 2-4 GM/100ML-% IV SOLN
2.0000 g | INTRAVENOUS | Status: AC
  Administered 2021-12-23: 2 g via INTRAVENOUS

## 2021-12-23 MED ORDER — LIDOCAINE HCL (CARDIAC) PF 100 MG/5ML IV SOSY
PREFILLED_SYRINGE | INTRAVENOUS | Status: DC | PRN
  Administered 2021-12-23: 40 mg via INTRAVENOUS

## 2021-12-23 MED ORDER — DEXMEDETOMIDINE HCL IN NACL 200 MCG/50ML IV SOLN
INTRAVENOUS | Status: DC | PRN
  Administered 2021-12-23 (×3): 4 ug via INTRAVENOUS

## 2021-12-23 MED ORDER — ONDANSETRON HCL 4 MG PO TABS: 4.0000 mg | ORAL_TABLET | Freq: Four times a day (QID) | ORAL | Status: DC | PRN

## 2021-12-23 MED ORDER — GLYCOPYRROLATE 0.2 MG/ML IJ SOLN
INTRAMUSCULAR | Status: AC
  Filled 2021-12-23: qty 1

## 2021-12-23 MED ORDER — VITAMIN B-12 1000 MCG PO TABS: 5000.0000 ug | ORAL_TABLET | Freq: Every day | ORAL | Status: DC

## 2021-12-23 MED ORDER — PANTOPRAZOLE SODIUM 20 MG PO TBEC: 20.0000 mg | DELAYED_RELEASE_TABLET | Freq: Two times a day (BID) | ORAL | Status: DC

## 2021-12-23 MED ORDER — PROPOFOL 10 MG/ML IV BOLUS
INTRAVENOUS | Status: AC
  Filled 2021-12-23: qty 20

## 2021-12-23 MED ORDER — OXYCODONE HCL 5 MG/5ML PO SOLN: 5.0000 mg | Freq: Once | ORAL | Status: AC | PRN

## 2021-12-23 MED ORDER — OXYCODONE HCL 5 MG PO TABS
5.0000 mg | ORAL_TABLET | Freq: Once | ORAL | Status: AC | PRN
  Administered 2021-12-23: 5 mg via ORAL

## 2021-12-23 MED ORDER — TRIAMCINOLONE ACETONIDE 40 MG/ML IJ SUSP
INTRAMUSCULAR | Status: AC
  Filled 2021-12-23: qty 2

## 2021-12-23 MED ORDER — BUPIVACAINE-EPINEPHRINE (PF) 0.5% -1:200000 IJ SOLN
INTRAMUSCULAR | Status: DC | PRN
  Administered 2021-12-23: 30 mL

## 2021-12-23 MED ORDER — PROPOFOL 500 MG/50ML IV EMUL
INTRAVENOUS | Status: DC | PRN
  Administered 2021-12-23: 50 ug/kg/min via INTRAVENOUS

## 2021-12-23 MED ORDER — OXYCODONE HCL 5 MG PO TABS: 5.0000 mg | ORAL_TABLET | ORAL | 0 refills | Status: DC | PRN

## 2021-12-23 MED ORDER — ATORVASTATIN CALCIUM 20 MG PO TABS: 40.0000 mg | ORAL_TABLET | Freq: Every day | ORAL | Status: DC

## 2021-12-23 MED ORDER — CEFAZOLIN SODIUM-DEXTROSE 2-4 GM/100ML-% IV SOLN
INTRAVENOUS | Status: AC
  Administered 2021-12-23: 2 g via INTRAVENOUS
  Filled 2021-12-23: qty 100

## 2021-12-23 MED ORDER — EPHEDRINE SULFATE (PRESSORS) 50 MG/ML IJ SOLN
INTRAMUSCULAR | Status: DC | PRN
  Administered 2021-12-23: 5 mg via INTRAVENOUS

## 2021-12-23 MED ORDER — PHENYLEPHRINE HCL-NACL 20-0.9 MG/250ML-% IV SOLN
INTRAVENOUS | Status: AC
  Filled 2021-12-23: qty 250

## 2021-12-23 MED ORDER — CEFAZOLIN SODIUM-DEXTROSE 2-4 GM/100ML-% IV SOLN
INTRAVENOUS | Status: AC
  Filled 2021-12-23: qty 100

## 2021-12-23 MED ORDER — ADULT MULTIVITAMIN W/MINERALS CH: 1.0000 | ORAL_TABLET | Freq: Every day | ORAL | Status: DC

## 2021-12-23 SURGICAL SUPPLY — 65 items
APL PRP STRL LF DISP 70% ISPRP (MISCELLANEOUS) ×2
BIT DRILL QUICK REL 1/8 2PK SL (DRILL) IMPLANT
BLADE SAW SAG 25X90X1.19 (BLADE) ×1 IMPLANT
BLADE SURG SZ20 CARB STEEL (BLADE) ×1 IMPLANT
BNDG CMPR STD VLCR NS LF 5.8X6 (GAUZE/BANDAGES/DRESSINGS) ×1
BNDG ELASTIC 6X5.8 VLCR NS LF (GAUZE/BANDAGES/DRESSINGS) ×1 IMPLANT
BRNG TIB 79X10 ANT STAB MDLR (Insert) ×1 IMPLANT
CEMENT BONE R 1X40 (Cement) ×2 IMPLANT
CEMENT VACUUM MIXING SYSTEM (MISCELLANEOUS) ×1 IMPLANT
CHLORAPREP W/TINT 26 (MISCELLANEOUS) ×1 IMPLANT
COMP FEMORAL CRUC RIGHT 75MM (Joint) ×1 IMPLANT
COMPONENT FEMRL CRUC RT 75MM (Joint) IMPLANT
COOLER POLAR GLACIER W/PUMP (MISCELLANEOUS) ×1 IMPLANT
COVER MAYO STAND REUSABLE (DRAPES) ×1 IMPLANT
CUFF TOURN SGL QUICK 24 (TOURNIQUET CUFF)
CUFF TOURN SGL QUICK 34 (TOURNIQUET CUFF)
CUFF TRNQT CYL 24X4X16.5-23 (TOURNIQUET CUFF) IMPLANT
CUFF TRNQT CYL 34X4.125X (TOURNIQUET CUFF) IMPLANT
DRAPE 3/4 80X56 (DRAPES) ×1 IMPLANT
DRAPE IMP U-DRAPE 54X76 (DRAPES) ×1 IMPLANT
DRAPE U-SHAPE 47X51 STRL (DRAPES) ×1 IMPLANT
DRILL QUICK RELEASE 1/8 INCH (DRILL) ×3
DRSG MEPILEX SACRM 8.7X9.8 (GAUZE/BANDAGES/DRESSINGS) IMPLANT
DRSG OPSITE POSTOP 4X10 (GAUZE/BANDAGES/DRESSINGS) ×1 IMPLANT
DRSG OPSITE POSTOP 4X8 (GAUZE/BANDAGES/DRESSINGS) ×1 IMPLANT
ELECT CAUTERY BLADE 6.4 (BLADE) IMPLANT
ELECT REM PT RETURN 9FT ADLT (ELECTROSURGICAL) ×1
ELECTRODE REM PT RTRN 9FT ADLT (ELECTROSURGICAL) ×1 IMPLANT
GAUZE XEROFORM 1X8 LF (GAUZE/BANDAGES/DRESSINGS) ×1 IMPLANT
GLOVE BIO SURGEON STRL SZ7.5 (GLOVE) ×4 IMPLANT
GLOVE BIO SURGEON STRL SZ8 (GLOVE) ×4 IMPLANT
GLOVE BIOGEL PI IND STRL 8 (GLOVE) ×1 IMPLANT
GLOVE SURG UNDER LTX SZ8 (GLOVE) ×1 IMPLANT
GOWN STRL REUS W/ TWL LRG LVL3 (GOWN DISPOSABLE) ×1 IMPLANT
GOWN STRL REUS W/ TWL XL LVL3 (GOWN DISPOSABLE) ×1 IMPLANT
GOWN STRL REUS W/TWL LRG LVL3 (GOWN DISPOSABLE) ×1
GOWN STRL REUS W/TWL XL LVL3 (GOWN DISPOSABLE) ×1
HOOD PEEL AWAY T7 (MISCELLANEOUS) ×3 IMPLANT
IMPL PATELLA POST 37X8.5 (Miscellaneous) IMPLANT
IMPLANT PATELLA POST 37X8.5 (Miscellaneous) ×1 IMPLANT
INSERT TIB BEARING 79X10 (Insert) IMPLANT
IV NS IRRIG 3000ML ARTHROMATIC (IV SOLUTION) ×1 IMPLANT
KIT TURNOVER KIT A (KITS) ×1 IMPLANT
MANIFOLD NEPTUNE II (INSTRUMENTS) ×1 IMPLANT
NDL SPNL 20GX3.5 QUINCKE YW (NEEDLE) ×1 IMPLANT
NEEDLE SPNL 20GX3.5 QUINCKE YW (NEEDLE) ×1 IMPLANT
NS IRRIG 1000ML POUR BTL (IV SOLUTION) ×1 IMPLANT
PACK TOTAL KNEE (MISCELLANEOUS) ×1 IMPLANT
PAD WRAPON POLAR KNEE (MISCELLANEOUS) ×1 IMPLANT
PENCIL SMOKE EVACUATOR (MISCELLANEOUS) ×1 IMPLANT
PLATE KNEE TIBIAL 79MM FIXED (Plate) IMPLANT
PULSAVAC PLUS IRRIG FAN TIP (DISPOSABLE) ×1
STAPLER SKIN PROX 35W (STAPLE) ×1 IMPLANT
SUCTION FRAZIER HANDLE 10FR (MISCELLANEOUS) ×1
SUCTION TUBE FRAZIER 10FR DISP (MISCELLANEOUS) ×1 IMPLANT
SUT VIC AB 0 CT1 36 (SUTURE) ×3 IMPLANT
SUT VIC AB 2-0 CT1 27 (SUTURE) ×3
SUT VIC AB 2-0 CT1 TAPERPNT 27 (SUTURE) ×3 IMPLANT
SYR 10ML LL (SYRINGE) ×1 IMPLANT
SYR 20ML LL LF (SYRINGE) ×1 IMPLANT
SYR 30ML LL (SYRINGE) IMPLANT
TIP FAN IRRIG PULSAVAC PLUS (DISPOSABLE) ×1 IMPLANT
TRAP FLUID SMOKE EVACUATOR (MISCELLANEOUS) ×2 IMPLANT
WATER STERILE IRR 500ML POUR (IV SOLUTION) ×1 IMPLANT
WRAPON POLAR PAD KNEE (MISCELLANEOUS) ×1

## 2021-12-23 NOTE — Anesthesia Preprocedure Evaluation (Signed)
Anesthesia Evaluation  Patient identified by MRN, date of birth, ID band Patient awake    Reviewed: Allergy & Precautions, NPO status , Patient's Chart, lab work & pertinent test results  History of Anesthesia Complications (+) Emergence Delirium and history of anesthetic complications  Airway Mallampati: III  TM Distance: >3 FB Neck ROM: full    Dental  (+) Dental Advidsory Given, Teeth Intact   Pulmonary neg pulmonary ROS, neg sleep apnea, resolved, neg COPD, former smoker   Pulmonary exam normal        Cardiovascular hypertension, (-) angina + CAD and + CABG  Normal cardiovascular exam+ Valvular Problems/Murmurs      Neuro/Psych negative neurological ROS  negative psych ROS   GI/Hepatic negative GI ROS, Neg liver ROS,,,  Endo/Other  Hypothyroidism    Renal/GU      Musculoskeletal   Abdominal   Peds  Hematology negative hematology ROS (+)   Anesthesia Other Findings Past Medical History: No date: Aortic arch pseudoaneurysm (HCC)     Comment:  a.) s/p re-do sternotomy 09/22/2018 - 32 mm HemaShield               Platnium woven double velour vascular graft No date: Aortic stenosis     Comment:  a.) TTE 10/04/2015: mod AS (MPG 21); b.) TTE 10/11/2015:              mod AS (MPG 28); c.) TTE 09/22/2016: mod AS (MPG 22); d.)              TTE 08/09/2017: mod AS (MPG 30); e.) R/LHC 08/23/2017:               mod AS (29.4); f.) s/p AVR (#25 Edwards Resilia Inspiris) No date: Arthritis No date: Cardiac murmur No date: Complication of anesthesia     Comment:  ?cognitive decline 08/23/2017: Coronary artery disease     Comment:  a.) R/LHC 08/23/2017: EF >65%, LVEDP 23, 95% oD1 with               90% side branch in lateral D1, 45% mLCx, 75% m-dLCx, 10%               p-mLAD; b.) s/p 2v CABG 10/01/2017 (LIMA-D1, SVG-OM) No date: ED (erectile dysfunction) No date: GERD (gastroesophageal reflux disease) No date:  Headache 2023: History of 2019 novel coronavirus disease (COVID-19) No date: Hyperlipidemia No date: Hypertension No date: Hypothyroidism No date: Nocturia No date: Pneumonia 08/01/2013: Primary prostate cancer with metastasis from prostate to  other site [facility])     Comment:  a.) stage T3a, gleason 3+4;  s/p radical prostatectomy               w/ node dissection 10/20/2013; b.) s/p adjuvant IMRT                03/05/2014 - 03/10/2016completed 04/10/2014)04-19-2014 10/01/2017: S/P aortic valve replacement     Comment:  a.) #25 Edwards Resilia Inspiris pericardial tissue               valve 10/01/2017: S/P CABG x 2     Comment:  a.) LIMA-diagonal, SVG-OM 03/05/2014 through 04/19/2014: S/P radiation therapy     Comment:  Prostate bed 6600 cGy in 33 sessions, pelvic lymph nodes              5000 445 cGy in 33 sessions  No date: Wears glasses  Past Surgical History: 10/01/2017: AORTIC VALVE REPLACEMENT; N/A     Comment:  Procedure:  AORTIC VALVE REPLACEMENT (AVR);  Surgeon:               Gaye Pollack, MD;  Location: [place];  Service: Open               Heart Surgery;  Laterality: N/A; 09/21/2018: ASCENDING AORTIC ROOT REPLACEMENT; N/A     Comment:  Procedure: REPAIR OF ASCENDING AORTIC ANEURYSM USING 32               MM HEMASHIELD PLATINUM WOVEN DOUBLE VELOUR VASCULAR GRAFT              WITH REATTACHMENT OF PREVIOUS SAPHENOUS VEIN GRAFT ;                Surgeon: [name], MD;  Location: [place];                Service: Open Heart Surgery;  Laterality: N/A; No date: COLONOSCOPY 04/19/2019: COLONOSCOPY WITH PROPOFOL; N/A     Comment:  Procedure: COLONOSCOPY WITH PROPOFOL;  Surgeon: Robert Bellow, MD;  Location: [facility];  Service:               Gastroenterology;  Laterality: N/A; 10/01/2017: CORONARY ARTERY BYPASS GRAFT; N/A     Comment:  Procedure: CORONARY ARTERY BYPASS GRAFTING (CABG) x two,              using left internal  mammary artery and right leg greater               saphenous vein harvested endoscopically;  Surgeon:               Gaye Pollack, MD;  Location: [place] [facility];  Service: Open               Heart Surgery;  Laterality: N/A; 09/21/2018: CORONARY ARTERY BYPASS GRAFT; N/A     Comment:  Procedure: OPEN RIGHT SAPHENOUS VEIN HARVEST;  Surgeon:               [name], MD;  Location: [place];  Service: Open               Heart Surgery;  Laterality: N/A; 10/20/2013: LYMPHADENECTOMY; Bilateral     Comment:  Procedure: BILATERAL PELVIC LYMPH NODE DISSECTION;                Surgeon: Alexis Frock, MD;  Location: [facility];  Service:              Urology;  Laterality: Bilateral; 08/26/2018: ORCHIECTOMY; Bilateral     Comment:  Procedure: ORCHIECTOMY;  Surgeon: Alexis Frock, MD;                Location: [facility];  Service: Urology;               Laterality: Bilateral; 03/06/2021: REVERSE SHOULDER ARTHROPLASTY; Left     Comment:  Procedure: REVERSE SHOULDER ARTHROPLASTY;  Surgeon:               Corky Mull, MD;  Location: [facility];  Service:               Orthopedics;  Laterality: Left; 08/23/2017: RIGHT/LEFT HEART CATH AND CORONARY ANGIOGRAPHY; N/A     Comment:  Procedure: RIGHT/LEFT HEART CATH AND CORONARY               ANGIOGRAPHY;  Surgeon: Ellyn Hack,  Leonie Green, MD;  Location:               [place] [facility];  Service: Cardiovascular;                Laterality: N/A; 10/20/2013: ROBOT ASSISTED LAPAROSCOPIC RADICAL PROSTATECTOMY; N/A     Comment:  Procedure: ROBOTIC ASSISTED LAPAROSCOPIC RADICAL               PROSTATECTOMY WITH INDOCYANINE GREEN DYE;  Surgeon:               Alexis Frock, MD;  Location: [facility];  Service: Urology;              Laterality: N/A; 10/01/2017: TEE WITHOUT CARDIOVERSION; N/A     Comment:  Procedure: TRANSESOPHAGEAL ECHOCARDIOGRAM (TEE);                Surgeon: Gaye Pollack, MD;  Location: [place];  Service:              Open Heart Surgery;   Laterality: N/A; 09/21/2018: TEE WITHOUT CARDIOVERSION; N/A     Comment:  Procedure: TRANSESOPHAGEAL ECHOCARDIOGRAM (TEE);                Surgeon: [name], MD;  Location: [place];                Service: Open Heart Surgery;  Laterality: N/A; child: TONSILLECTOMY 1983: VARICOCELECTOMY  BMI    Body Mass Index: 30.57 kg/m      Reproductive/Obstetrics negative OB ROS                             Anesthesia Physical Anesthesia Plan  ASA: 3  Anesthesia Plan: Spinal   Post-op Pain Management:    Induction:   PONV Risk Score and Plan: 3 and Propofol infusion, TIVA, Midazolam, Ondansetron and Dexamethasone  Airway Management Planned: Natural Airway and Nasal Cannula  Additional Equipment:   Intra-op Plan:   Post-operative Plan:   Informed Consent: I have reviewed the patients History and Physical, chart, labs and discussed the procedure including the risks, benefits and alternatives for the proposed anesthesia with the patient or authorized representative who has indicated his/her understanding and acceptance.     Dental Advisory Given  Plan Discussed with: Anesthesiologist, CRNA and Surgeon  Anesthesia Plan Comments: (Patient reports no bleeding problems and no anticoagulant use.  Plan for spinal with backup GA  Patient consented for risks of anesthesia including but not limited to:  - adverse reactions to medications - damage to eyes, teeth, lips or other oral mucosa - nerve damage due to positioning  - risk of bleeding, infection and or nerve damage from spinal that could lead to paralysis - risk of headache or failed spinal - damage to teeth, lips or other oral mucosa - sore throat or hoarseness - damage to heart, brain, nerves, lungs, other parts of body or loss of life  Patient voiced understanding.)        Anesthesia Quick Evaluation

## 2021-12-23 NOTE — Op Note (Signed)
12/23/2021  10:11 AM  Patient:   Danny Odom  Pre-Op Diagnosis:   Degenerative joint disease, right knee.  Post-Op Diagnosis:   Same  Procedure:   Right TKA using all-cemented Biomet Vanguard system with a 75 mm PCR femur, a 79 mm tibial tray with a 10 mm anterior stabilized E-poly insert, and a 37 x 8.6 mm all-poly 3-pegged domed patella.  Surgeon:   Pascal Lux, MD  Assistant:   Cameron Proud, PA-C; Reymundo Poll, PA-S  Anesthesia:   Spinal  Findings:   As above  Complications:   None  EBL:   15 cc  Fluids:   800 cc crystalloid  UOP:   None  TT:   90 minutes at 300 mmHg  Drains:   None  Closure:   Staples  Implants:   As above  Brief Clinical Note:   The patient is a 71 year old male with a long history of progressively worsening right knee pain. The patient's symptoms have progressed despite medications, activity modification, injections, etc. The patient's history and examination were consistent with advanced degenerative joint disease of the right knee confirmed by plain radiographs. The patient presents at this time for a right total knee arthroplasty.  Procedure:   The patient was brought into the operating room. After adequate spinal anesthesia was obtained, the patient was lain in the supine position before the right lower extremity was prepped with ChloraPrep solution and draped sterilely. Preoperative antibiotics were administered. A timeout was performed to verify the appropriate surgical site before the limb was exsanguinated with an Esmarch and the tourniquet inflated to 300 mmHg.   A standard anterior approach to the knee was made through an approximately 7 inch incision. The incision was carried down through the subcutaneous tissues to expose superficial retinaculum. This was split the length of the incision and the medial flap elevated sufficiently to expose the medial retinaculum. The medial retinaculum was incised, leaving a 3-4 mm cuff of tissue  on the patella. This was extended distally along the medial border of the patellar tendon and proximally through the medial third of the quadriceps tendon. A subtotal fat pad excision was performed before the soft tissues were elevated off the anteromedial and anterolateral aspects of the proximal tibia to the level of the collateral ligaments. The anterior portions of the medial and lateral menisci were removed, as was the anterior cruciate ligament. With the knee flexed to 90, the external tibial guide was positioned and the appropriate proximal tibial cut made. This piece was taken to the back table where it was measured and found to be optimally replicated by a 79 mm component.  Attention was directed to the distal femur. The intramedullary canal was accessed through a 3/8" drill hole. The intramedullary guide was inserted and positioned in order to obtain a neutral flexion gap. The intercondylar block was positioned with care taken to avoid notching the anterior cortex of the femur. The appropriate cut was made. Next, the distal cutting block was placed at 5 of valgus alignment. Using the 9 mm slot, the distal cut was made. The distal femur was measured and found to be optimally replicated by the 75 mm component. The 75 mm 4-in-1 cutting block was positioned and first the posterior, then the posterior chamfer, the anterior chamfer, and finally the anterior cuts were made. At this point, the posterior portions medial and lateral menisci were removed. A trial reduction was performed using the appropriate femoral and tibial components with the 10 mm  insert. This demonstrated excellent stability to varus and valgus stressing both in flexion and extension while permitting full extension. Patella tracking was assessed and found to be excellent. Therefore, the tibial guide position was marked on the proximal tibia. The patella thickness was measured and found to be 25 mm. Therefore, the appropriate cut was made.  The patellar surface was measured and found to be optimally replicated by the 37 mm component. The three peg holes were drilled in place before the trial button was inserted. Patella tracking was assessed and found to be excellent, passing the "no thumb test". The lug holes were drilled into the distal femur before the trial component was removed, leaving only the tibial tray. The keel was then created using the appropriate tower, reamer, and punch.  The bony surfaces were prepared for cementing by irrigating them thoroughly with sterile saline solution via the jet lavage system. A bone plug was fashioned from some of the bone that had been removed previously and used to plug the distal femoral canal. In addition, 20 cc of Exparel diluted out to 60 cc with normal saline and 30 cc of 0.5% Sensorcaine were injected into the postero-medial and postero-lateral aspects of the knee, the medial and lateral gutter regions, and the peri-incisional tissues to help with postoperative analgesia. Meanwhile, the cement was being mixed on the back table. When it was ready, the tibial tray was cemented in first. The excess cement was removed using [employer] fast streamer. Next, the femoral component was impacted into place. Again, the excess cement was removed using [employer] fast streamer. The 10 mm trial insert was positioned and the knee brought into extension while the cement hardened. Finally, the patella was cemented into place and secured using the patellar clamp. Again, the excess cement was removed using [employer] fast streamer. Once the cement had hardened, the knee was placed through a range of motion with the findings as described above. Therefore, the trial insert was removed and, after verifying that no cement had been retained posteriorly, the permanent 10 mm anterior stabilized E-polyethylene insert was positioned and secured using the appropriate key locking mechanism. Again the knee was placed through a range of motion with the  findings as described above.  The wound was copiously irrigated with sterile saline solution using the jet lavage system before the quadriceps tendon and retinacular layer were reapproximated using #0 Vicryl interrupted sutures. The superficial retinacular layer also was closed using a running #0 Vicryl suture. A total of 10 cc of transexemic acid (TXA) was injected intra-articularly before the subcutaneous tissues were closed in several layers using 2-0 Vicryl interrupted sutures. The skin was closed using staples. A sterile honeycomb dressing was applied to the skin before the leg was wrapped with an Ace wrap to accommodate the Polar Care device. The patient was then awakened and returned to the recovery room in satisfactory condition after tolerating the procedure well.

## 2021-12-23 NOTE — Anesthesia Procedure Notes (Addendum)
Spinal  Patient location during procedure: OR Start time: 12/23/2021 7:47 AM End time: 12/23/2021 7:54 AM Reason for block: surgical anesthesia Staffing Performed: resident/CRNA  Anesthesiologist: Dimas Millin, MD Resident/CRNA: Adalberto Ill, CRNA Performed by: Adalberto Ill, CRNA Authorized by: Dimas Millin, MD   Preanesthetic Checklist Completed: patient identified, IV checked, site marked, risks and benefits discussed, surgical consent, monitors and equipment checked, pre-op evaluation and timeout performed Spinal Block Patient position: sitting Prep: ChloraPrep and allowed to dry for 3 minutes dry to touch before beginning procedure Patient monitoring: heart rate, cardiac monitor, continuous pulse ox and blood pressure Approach: midline Location: L4-5 Injection technique: single-shot Needle Needle type: Pencan  Needle gauge: 24 G Assessment Sensory level: T12 Events: CSF return Additional Notes Risks benefits alternatives and plan discussed with patient in the preoperative area by CRNA and MD all questions answered, in OR prepped with chloraprep allowed to dry, identified Toufier's line skin wheal lidocaine pf L4-5 introducer pencan one attempt positive csf flow clear MPF bupivicaine 0.5% lot [id number] exp 11/26 drawn from Dr Barbra Sarks to [facility] CRNA closed loop communication sterile pass, sterile procedure throughout sterile glove drape hairnets worn by all, patient verbalizes numbness to BLE and difficulty moving BLE sedation initiated patient supine and verbalizes positioning comfortable, warmed with Bair hugger upper body

## 2021-12-23 NOTE — Progress Notes (Cosign Needed)
Patient is not able to walk the distance required to go the bathroom, or he/she is unable to safely negotiate stairs required to access the bathroom.  A 3in1 BSC will alleviate this problem  

## 2021-12-23 NOTE — Discharge Instructions (Addendum)
Orthopedic discharge instructions: May shower with intact OpSite dressing. Apply ice frequently to knee or use Polar Care. Start Eliquis 1 tablet (2.5 mg) twice daily on Wednesday, 12/24/2021, for 2 weeks, then take aspirin 325 mg (or 4 baby aspirins) twice daily for 4 weeks. Take pain medication as prescribed when needed.  May supplement with ES Tylenol if necessary. May weight-bear as tolerated on right leg - use walker for balance and support. Follow-up in 10-14 days or as scheduled.  AMBULATORY SURGERY  DISCHARGE INSTRUCTIONS   The drugs that you were given will stay in your system until tomorrow so for the next 24 hours you should not:  Drive an automobile Make any legal decisions Drink any alcoholic beverage   You may resume regular meals tomorrow.  Today it is better to start with liquids and gradually work up to solid foods.  You may eat anything you prefer, but it is better to start with liquids, then soup and crackers, and gradually work up to solid foods.   Please notify your doctor immediately if you have any unusual bleeding, trouble breathing, redness and pain at the surgery site, drainage, fever, or pain not relieved by medication.    Additional Instructions:   PLEASE LEAVE GREEN ARMBAND ON FOR 4 DAYS   Please contact your physician with any problems or Same Day Surgery at [phone], Monday through Friday 6 am to 4 pm, or [place] at [facility] number at [phone].  POLAR CARE INFORMATION  [email or web address]  How to use [place] Cold Therapy System?  YouTube   [email or web address]  OPERATING INSTRUCTIONS  Start the product With dry hands, connect the transformer to the electrical connection located on the top of the cooler. Next, plug the transformer into an appropriate electrical outlet. The unit will automatically start running at this point.  To stop the pump, disconnect electrical power.  Unplug to stop  the product when not in use. Unplugging the Polar Care unit turns it off. Always unplug immediately after use. Never leave it plugged in while unattended. Remove pad.    FIRST ADD WATER TO FILL LINE, THEN ICE---Replace ice when existing ice is almost melted  1 Discuss Treatment with your [place] Practitioner and Use Only as Prescribed 2 Apply Insulation Barrier & Cold Therapy Pad 3 Check for Moisture 4 Inspect Skin Regularly  Tips and Trouble Shooting Usage Tips 1. Use cubed or chunked ice for optimal performance. 2. It is recommended to drain the Pad between uses. To drain the pad, hold the Pad upright with the hose pointed toward the ground. Depress the black plunger and allow water to drain out. 3. You may disconnect the Pad from the unit without removing the pad from the affected area by depressing the silver tabs on the hose coupling and gently pulling the hoses apart. The Pad and unit will seal itself and will not leak. Note: Some dripping during release is normal. 4. DO NOT RUN PUMP WITHOUT WATER! The pump in this unit is designed to run with water. Running the unit without water will cause permanent damage to the pump. 5. Unplug unit before removing lid.  TROUBLESHOOTING GUIDE Pump not running, Water not flowing to the pad, Pad is not getting cold 1. Make sure the transformer is plugged into the wall outlet. 2. Confirm that the ice and water are filled to the indicated levels. 3. Make sure there are no kinks in the pad. 4. Gently pull on the blue tube  to make sure the tube/pad junction is straight. 5. Remove the pad from the treatment site and ll it while the pad is lying at; then reapply. 6. Confirm that the pad couplings are securely attached to the unit. Listen for the double clicks (Figure 1) to confirm the pad couplings are securely attached.  Leaks    Note: Some condensation on the lines, controller, and pads is unavoidable, especially in warmer climates. 1. If  using a [place] Polar Care Cold Therapy unit with a detachable Cold Therapy Pad, and a leak exists (other than condensation on the lines) disconnect the pad couplings. Make sure the silver tabs on the couplings are depressed before reconnecting the pad to the pump hose; then confirm both sides of the coupling are properly clicked in. 2. If the coupling continues to leak or a leak is detected in the pad itself, stop using it and call [place] at [phone].  Cleaning After use, empty and dry the unit with a soft cloth. Warm water and mild detergent may be used occasionally to clean the pump and tubes.  WARNING: The [place] can be cold enough to cause serious injury, including full skin necrosis. Follow these Operating Instructions, and carefully read the Product Insert (see pouch on side of unit) and the Cold Therapy Pad Fitting Instructions (provided with each Cold Therapy Pad) prior to use.

## 2021-12-23 NOTE — TOC Progression Note (Signed)
Transition of Care [facility]) - Progression Note    Patient Details  Name: Danny Odom MRN: [id number] Date of Birth: 08-19-1950  Transition of Care [facility]) CM/SW [place], RN Phone Number: 12/23/2021, 2:20 PM  Clinical Narrative:      The patietn is set up with [facility] for [facility] he will have a RW and 3 in 1 delivered to the bedside by adapt prior to DC      Expected Discharge Plan and Services           Expected Discharge Date: 12/23/21                                     Social Determinants of Health (SDOH) Interventions    Readmission Risk Interventions     No data to display

## 2021-12-23 NOTE — Transfer of Care (Signed)
Immediate Anesthesia Transfer of Care Note  Patient: Danny Odom  Procedure(s) Performed: TOTAL KNEE ARTHROPLASTY (Right: Knee)  Patient Location: PACU  Anesthesia Type:Spinal  Level of Consciousness: awake, alert , oriented, and patient cooperative  Airway & Oxygen Therapy: Patient Spontanous Breathing and Patient connected to face mask oxygen  Post-op Assessment: Report given to RN and Post -op Vital signs reviewed and stable  Post vital signs: Reviewed and stable  Last Vitals:  Vitals Value Taken Time  BP 133/74 12/23/21 1015  Temp    Pulse 66 12/23/21 1016  Resp 19 12/23/21 1016  SpO2 98 % 12/23/21 1016  Vitals shown include unvalidated device data.  Last Pain:  Vitals:   12/23/21 0620  TempSrc: Temporal  PainSc: 0-No pain         Complications: No notable events documented.

## 2021-12-23 NOTE — H&P (Signed)
History of Present Illness: Danny Odom is a 71 y.o. male who presents today for his surgical history and physical for upcoming right total knee arthroplasty scheduled Dr. Roland Rack on 12/23/2021. The patient denies any changes in his medical history since he was last evaluated. He denies any trauma or injury affecting the right knee since the last appointment. His pain score today is a 3 out of 10 in the right knee, he does continue to have increased discomfort with prolonged periods of walking and standing. He reports an aching and throbbing discomfort in the right knee at today's visit. The patient denies any personal history of heart attack or stroke, he does have a history of a prosthetic aortic valve replacement, he does take aspirin routinely. The patient has stopped his aspirin leading up to surgery at this time. He denies any personal history of asthma or COPD. He is not a diabetic. He denies any history of blood clots.  Past Medical History: Colon polyp 1949-02-22  Tubular adenoma  Coronary artery disease August 2020  GERD (gastroesophageal reflux disease) 2018  History of prosthetic aortic valve replacement 09/12/2018  Pericardial valve, 8/19  Hx of migraine headaches  Hypertension  Other and unspecified hyperlipidemia  Post-traumatic osteoarthritis of left shoulder 11/18/2020  Prostate cancer (CMS-HCC) 09/25/2013  Thyroid disease  Tubular adenoma 04/22/1949   Past Surgical History: COLONOSCOPY 01/24/1950  KNEE ARTHROSCOPY 2003  COLONOSCOPY 04/19/2019 (Diverticulosis/PHx CP/Repeat 68yr/JWB)  Open heart surgery 09/2019  Reverse left total shoulder arthroplasty with biceps tenodesis Left 03/06/2021 (Dr. PRoland Rack  CARDIAC VALVE REPLACEMENT  CORONARY ARTERY BYPASS GRAFT  TONSILLECTOMY AND ADENOIDECTOMY  VARICOCELE EXCISION   Past Family History: Breast cancer Mother  High blood pressure (Hypertension) Mother  Cancer Mother  Colon polyps Father (in his 487s  Alcohol abuse Father   High blood pressure (Hypertension) Other (family hx)   Medications: acetaminophen (TYLENOL) 500 MG tablet Take 500 mg by mouth  aspirin 81 MG EC tablet Take 81 mg by mouth daily.  atorvastatin (LIPITOR) 40 MG tablet Take 1 tablet (40 mg total) by mouth once daily 90 tablet 3  cyanocobalamin (VITAMIN B12) 500 MCG tablet Take by mouth  doxycycline (MONODOX) 100 MG capsule Take 1 capsule (100 mg total) by mouth 2 (two) times daily for 7 days 14 capsule 0  levothyroxine (SYNTHROID) 100 MCG tablet Take 1 tablet (100 mcg total) by mouth once daily Take on an empty stomach with a glass of water at least 30-60 minutes before breakfast. 90 tablet 3  losartan (COZAAR) 50 MG tablet Take 1 tablet (50 mg total) by mouth once daily 90 tablet 3  metoprolol tartrate (LOPRESSOR) 25 MG tablet Take 0.5 tablets (12.5 mg total) by mouth 2 (two) times daily 90 tablet 3  MULTIVITAMIN ORAL Take 1 tablet by mouth daily.  pantoprazole (PROTONIX) 20 MG DR tablet Take 1 tablet (20 mg total) by mouth 2 (two) times daily before meals for 90 days 180 tablet 3  zinc 50 mg Tab Take 1 tablet by mouth daily.  levothyroxine (SYNTHROID) 100 MCG tablet Take by mouth  pantoprazole (PROTONIX) 20 MG DR tablet Take 20 mg by mouth 2 (two) times daily (Patient not taking: Reported on 12/17/2021)   Allergies: POISON IVY (itching)  Review of Systems:  A comprehensive 14 point ROS was performed, reviewed by me today, and the pertinent orthopaedic findings are documented in the HPI.  Physical Exam: BP 118/66  Ht 176.5 cm (5' 9.5")  Wt 94 kg (207 lb 3.2 oz)  BMI 30.16 kg/m  General/Constitutional: The patient appears to be well-nourished, well-developed, and in no acute distress. Neuro/Psych: Normal mood and affect, oriented to person, place and time. Eyes: Non-icteric. Pupils are equal, round, and reactive to light, and exhibit synchronous movement. ENT: Unremarkable. Lymphatic: No palpable adenopathy. Respiratory: Lungs clear  to auscultation, Normal chest excursion, No wheezes, and Non-labored breathing Cardiovascular: Regular rate and rhythm. No murmurs. and No edema, swelling or tenderness, except as noted in detailed exam. Integumentary: No impressive skin lesions present, except as noted in detailed exam. Musculoskeletal: Unremarkable, except as noted in detailed exam.  Right knee exam: GAIT: Mild limp and uses no assistive devices. ALIGNMENT: Mild varus SKIN: Unremarkable SWELLING: Minimal EFFUSION: Trace WARMTH: None TENDERNESS: mild over the medial joint line ROM: 5-130 degrees without pain McMURRAY'S: Negative PATELLOFEMORAL: Normal tracking with no peri-patellar tenderness and negative apprehension sign CREPITUS: Minimal patellofemoral crepitance LACHMAN'S: Negative PIVOT SHIFT: Negative ANTERIOR DRAWER: Negative POSTERIOR DRAWER: Negative VARUS/VALGUS: Positive pseudolaxity to varus stressing  He remains neurovascularly intact to the right lower extremity and foot.  Imaging: AP weightbearing of both knees, as well as lateral and merchant views of the right knee were obtained at a previous visit and reviewed today. These films demonstrate moderate-severe degenerative changes, primarily involving the medial compartment with 90% medial joint space narrowing. Overall alignment is mild varus. No fractures, lytic lesions, or abnormal calcifications are noted. AP weightbearing of both knees, as well as lateral and merchant views of the right knee are available for review and have been reviewed by myself. These films demonstrate moderate-severe degenerative changes, primarily involving the medial compartment with 95% medial joint space narrowing. Overall alignment is mild varus. No fractures, lytic lesions, or abnormal calcifications are noted.   Impression: 1. Primary osteoarthritis of right knee.  Plan:  1. Treatment options were discussed today with the patient. 2. The patient is scheduled for a  right total knee arthroplasty with Dr. Roland Rack on 12/23/2021. 3. The patient was instructed on the risk and benefits of surgery and wishes to proceed at this time. This document will serve as a surgical history and physical for the patient. 4. The patient will follow-up per standard postop protocol. They can call the clinic they have any questions, new symptoms develop or symptoms worsen.  The procedure was discussed with the patient, as were the potential risks (including bleeding, infection, nerve and/or blood vessel injury, persistent or recurrent pain, failure of the hardware, instability, periprosthetic fracture, need for further surgery, blood clots, strokes, heart attacks and/or arhythmias, pneumonia, etc.) and benefits. The patient states his understanding and wishes to proceed.    H&P reviewed and patient re-examined. No changes.

## 2021-12-23 NOTE — Evaluation (Signed)
Physical Therapy Evaluation Patient Details Name: Danny Odom MRN: [id number] DOB: 10-07-1950 Today's Date: 12/23/2021  History of Present Illness  Danny Odom is a 6yoM who comes to [facility] for elective Rt TKA. Pt underewent Left TSA earlier in year. At baseline pt lives alone, is fully independent, does not use AD for mobility. Pt will have help from his Son and DTR at DC.  Clinical Impression  Pt admitted with above Dx. Pt has functional limitations due to deficits below (see "PT Problem List"). Pt able to provide details on baseline functional status. All interventions and precautions maintained as indicated by postoperative protocol per referring provider and/or subsequent communication with said provider. Recovery intervals given as needed for pain control, quality of movement, and at pt request. Pt educated on best technique for each intervention- author uses verbal, visual, tactile cues to optimize learning. Author takes steps to maximize patient independence when appropriate. Pt remains highly motivated. Pt closely monitored throughout session for safe activity response, as well as to maximize patient safety during interventions. The patient's therapy prognosis indicates continued potential for improvement, anticipate that future progress is attainable in a reasonable/predictable timeframe. Maximum improvement is within reach. Pt is now ready for DC to home from a PT standpoint. Pt indicates a plan in place to begin with HHPT then transition to OPPT at [facility]. All education completed, and time is given to address all questions/concerns. No additional skilled PT services needed at this time, PT signing off. PT recommends daily ambulation ad lib or with nursing staff as needed to prevent deconditioning.         Recommendations for follow up therapy are one component of a multi-disciplinary discharge planning process, led by the attending physician.  Recommendations may be updated based  on patient status, additional functional criteria and insurance authorization.  Follow Up Recommendations Follow physician's recommendations for discharge plan and follow up therapies      Assistance Recommended at Discharge Set up Supervision/Assistance  Patient can return home with the following  Help with stairs or ramp for entrance;Assist for transportation;Assistance with cooking/housework;Two people to help with bathing/dressing/bathroom;Direct supervision/assist for medications management    Equipment Recommendations Rolling walker (2 wheels);BSC/3in1  Recommendations for Other Services       Functional Status Assessment Patient has had a recent decline in their functional status and demonstrates the ability to make significant improvements in function in a reasonable and predictable amount of time.     Precautions / Restrictions Precautions Precautions: Fall;Knee Precaution Booklet Issued: Yes (comment) Restrictions Weight Bearing Restrictions: Yes RLE Weight Bearing: Weight bearing as tolerated      Mobility  Bed Mobility               General bed mobility comments: pt in recliner on entry    Transfers Overall transfer level: Needs assistance Equipment used: Rolling walker (2 wheels) Transfers: Sit to/from Stand Sit to Stand: Supervision, Modified independent (Device/Increase time)           General transfer comment: cues for safety    Ambulation/Gait Ambulation/Gait assistance: Min guard Gait Distance (Feet): 120 Feet Assistive device: Rolling walker (2 wheels) Gait Pattern/deviations: Step-through pattern       General Gait Details: judocious step-throughout gait pattern without cues, continuous propulsion of RW in a safe position.  Stairs Stairs: Yes Stairs assistance: Min guard Stair Management: Two rails, Step to pattern Number of Stairs: 4    Wheelchair Mobility    Modified Rankin (Stroke Patients Only)  Balance Overall  balance assessment: Mild deficits observed, not formally tested, Needs assistance           Standing balance-Leahy Scale: Good Standing balance comment: LOB when finishing hands-free urination, Pryor Curia provides assist to periscap area.                             Pertinent Vitals/Pain Pain Assessment Pain Assessment: 0-10 Pain Score: 1  Pain Location: Right Pain Descriptors / Indicators: Aching Pain Intervention(s): Limited activity within patient's tolerance, Monitored during session, Premedicated before session, Repositioned    Home Living Family/patient expects to be discharged to:: Private residence Living Arrangements: Alone Available Help at Discharge: Family (Son/DTR) Type of Home:  (condo) Home Access: Level entry       Home Layout: One level Home Equipment: None      Prior Function Prior Level of Function : Independent/Modified Independent;Driving                     Hand Dominance   Dominant Hand: Right    Extremity/Trunk Assessment                Communication      Cognition Arousal/Alertness: Awake/alert Behavior During Therapy: WFL for tasks assessed/performed Overall Cognitive Status: Within Functional Limits for tasks assessed                                          General Comments      Exercises Total Joint Exercises Short Arc Quad: AROM, Right, 10 reps, Supine Heel Slides: Right, 10 reps, AAROM, Supine Goniometric ROM: 15-95 degrees Rt knee flexion P/ROM   Assessment/Plan    PT Assessment All further PT needs can be met in the next venue of care  PT Problem List Decreased activity tolerance;Decreased range of motion;Decreased mobility       PT Treatment Interventions      PT Goals (Current goals can be found in the Care Plan section)  Acute Rehab PT Goals Patient Stated Goal: return to home today PT Goal Formulation: All assessment and education complete, DC therapy Time For Goal  Achievement: 01/06/22 Potential to Achieve Goals: Good    Frequency       Co-evaluation               AM-PAC PT "6 Clicks" Mobility  Outcome Measure Help needed turning from your back to your side while in a flat bed without using bedrails?: None Help needed moving from lying on your back to sitting on the side of a flat bed without using bedrails?: None Help needed moving to and from a bed to a chair (including a wheelchair)?: A Little Help needed standing up from a chair using your arms (e.g., wheelchair or bedside chair)?: A Little Help needed to walk in hospital room?: A Little Help needed climbing 3-5 steps with a railing? : A Little 6 Click Score: 20    End of Session Equipment Utilized During Treatment: Gait belt Activity Tolerance: Patient tolerated treatment well;No increased pain Patient left: in chair;with family/visitor present;with call bell/phone within reach Nurse Communication: Mobility status PT Visit Diagnosis: Difficulty in walking, not elsewhere classified (R26.2);Other abnormalities of gait and mobility (R26.89)    Time: 1341-1417 PT Time Calculation (min) (ACUTE ONLY): 36 min   Charges:   PT Evaluation $PT Eval Moderate  Complexity: 1 Mod PT Treatments $Gait Training: 8-22 mins $Therapeutic Exercise: 8-22 mins       2:31 PM, 12/23/21 Etta Grandchild, PT, DPT Physical Therapist - [facility]  [phone] ([place])    [place] 12/23/2021, 2:27 PM

## 2021-12-29 NOTE — Anesthesia Postprocedure Evaluation (Signed)
Anesthesia Post Note  Patient: HAFIZ IRION  Procedure(s) Performed: TOTAL KNEE ARTHROPLASTY (Right: Knee)  Patient location during evaluation: Nursing Unit Anesthesia Type: Spinal Level of consciousness: oriented and awake and alert Pain management: pain level controlled Vital Signs Assessment: post-procedure vital signs reviewed and stable Respiratory status: spontaneous breathing and respiratory function stable Cardiovascular status: blood pressure returned to baseline and stable Postop Assessment: no headache, no backache, no apparent nausea or vomiting and patient able to bend at knees Anesthetic complications: no  No notable events documented.   Last Vitals:  Vitals:   12/23/21 1105 12/23/21 1512  BP: (!) 154/94 (!) 157/79  Pulse: 64 73  Resp: 16 16  Temp:  (!) 36.4 C  SpO2: 96% 93%    Last Pain:  Vitals:   12/23/21 1512  TempSrc:   PainSc: 0-No pain                 Dimas Millin

## 2022-04-10 DIAGNOSIS — J690 Pneumonitis due to inhalation of food and vomit: Secondary | ICD-10-CM

## 2022-04-10 HISTORY — DX: Pneumonitis due to inhalation of food and vomit: J69.0

## 2022-04-22 ENCOUNTER — Ambulatory Visit
Admission: RE | Admit: 2022-04-22 | Discharge: 2022-04-22 | Disposition: A | Discharge status: Home / Self Care | Payer: Medicare Other | Source: Ambulatory Visit | Attending: Internal Medicine | Admitting: Internal Medicine

## 2022-04-22 ENCOUNTER — Other Ambulatory Visit: Payer: Self-pay | Admitting: Internal Medicine

## 2022-04-22 DIAGNOSIS — J690 Pneumonitis due to inhalation of food and vomit: Secondary | ICD-10-CM | POA: Insufficient documentation

## 2022-05-07 ENCOUNTER — Other Ambulatory Visit: Payer: Self-pay | Admitting: Pulmonary Disease

## 2022-05-07 DIAGNOSIS — R0602 Shortness of breath: Secondary | ICD-10-CM

## 2022-05-07 DIAGNOSIS — J811 Chronic pulmonary edema: Secondary | ICD-10-CM

## 2022-05-08 ENCOUNTER — Ambulatory Visit
Admission: RE | Admit: 2022-05-08 | Discharge: 2022-05-08 | Disposition: A | Discharge status: Home / Self Care | Payer: Medicare Other | Source: Ambulatory Visit | Attending: Pulmonary Disease | Admitting: Pulmonary Disease

## 2022-05-08 DIAGNOSIS — N200 Calculus of kidney: Secondary | ICD-10-CM | POA: Insufficient documentation

## 2022-05-08 DIAGNOSIS — J811 Chronic pulmonary edema: Secondary | ICD-10-CM | POA: Diagnosis not present

## 2022-05-08 DIAGNOSIS — R0602 Shortness of breath: Secondary | ICD-10-CM | POA: Diagnosis present

## 2022-09-02 NOTE — Progress Notes (Signed)
HPI: FU AVR and CAD. Echocardiogram repeated July 2019 and showed normal LV function, functionally bicuspid aortic valve with moderate aortic stenosis (mean gradient 30 mmHg), moderate aortic insufficiency, mild mitral regurgitation. CTA July 2019 showed 3.8 to 3.9 cm aortic root. Cardiac catheterization July 2019 showed normal LV function, left ventricular end-diastolic pressure 23 mmHg, moderate aortic stenosis, 90% first diagonal and 75% circumflex.  Preoperative carotid Dopplers August 2019 showed no significant stenosis.  Patient had aortic valve replacement with pericardial valve as well as coronary artery bypass and graft with a LIMA to the diagonal and saphenous vein graft to the obtuse marginal in August 2019.  CTA 8/20 showed ascending aortic aneurysm of 5 cm and ascending dissection. Pt subsequently had ascending aortic root replacement 09/21/18.  Follow-up CTA August 2022 showed previous repair of ascending aorta without recurrence of pseudoaneurysm, mild fusiform aneurysmal dilatation of the ascending thoracic aorta at 42 mm and prior aortic valve replacement; possible MAI.  CTA March 2024 showed prior aortic root replacement with aneurysmal dilatation of the distal ascending aorta near the arch at 4.3 cm.  Echocardiogram at [facility] April 2024 showed normal LV function, mild right ventricular enlargement, mild biatrial enlargement, status post aortic valve replacement with bioprosthetic valve with normal function.  Since last seen, patient denies dyspnea, chest pain, palpitations or syncope.  Current Outpatient Medications  Medication Sig Dispense Refill   acetaminophen (TYLENOL) 500 MG tablet Take 500 mg by mouth every 8 (eight) hours as needed for moderate pain.     atorvastatin (LIPITOR) 40 MG tablet Take 1 tablet (40 mg total) by mouth at bedtime. 14 tablet 0   doxycycline (MONODOX) 100 MG capsule Take 100 mg by mouth 2 (two) times daily.     levothyroxine (SYNTHROID) 100 MCG  tablet Take 100 mcg by mouth daily before breakfast.     losartan (COZAAR) 50 MG tablet TAKE 1 TABLET BY MOUTH ONCE DAILY MUST  KEEP  APPOINTMENT  FOR  MORE  REFILLS 49 tablet 0   Methylcobalamin (B-12) 5000 MCG TBDP Take 5,000 mcg by mouth daily.     metoprolol tartrate (LOPRESSOR) 25 MG tablet Take 0.5 tablets (12.5 mg total) by mouth 2 (two) times daily. 14 tablet 0   Multiple Vitamin (MULTIVITAMIN WITH MINERALS) TABS tablet Take 1 tablet by mouth daily with breakfast. ONE-A-DAY MEN'S 50+     pantoprazole (PROTONIX) 20 MG tablet Take 20 mg by mouth 2 (two) times daily.     zinc gluconate 50 MG tablet Take 50 mg by mouth at bedtime.      apixaban (ELIQUIS) 2.5 MG TABS tablet Take 1 tablet (2.5 mg total) by mouth 2 (two) times daily. (Patient not taking: Reported on 09/15/2022) 30 tablet 0   No current facility-administered medications for this visit.     Past Medical History:  Diagnosis Date   Aortic arch pseudoaneurysm (HCC)    a.) s/p re-do sternotomy 09/22/2018 - 32 mm HemaShield Platnium woven double velour vascular graft   Aortic stenosis    a.) TTE 10/04/2015: mod AS (MPG 21); b.) TTE 10/11/2015: mod AS (MPG 28); c.) TTE 09/22/2016: mod AS (MPG 22); d.) TTE 08/09/2017: mod AS (MPG 30); e.) R/LHC 08/23/2017: mod AS (29.4); f.) s/p AVR (#25 Edwards Resilia Inspiris)   Arthritis    Cardiac murmur    Complication of anesthesia    ?cognitive decline   Coronary artery disease 08/23/2017   a.) R/LHC 08/23/2017: EF >65%, LVEDP 23, 95% oD1 with 90%  side branch in lateral D1, 45% mLCx, 75% m-dLCx, 10% p-mLAD; b.) s/p 2v CABG 10/01/2017 (LIMA-D1, SVG-OM)   ED (erectile dysfunction)    GERD (gastroesophageal reflux disease)    Headache    History of 2019 novel coronavirus disease (COVID-19) 2023   Hyperlipidemia    Hypertension    Hypothyroidism    Nocturia    Pneumonia    Primary prostate cancer with metastasis from prostate to other site [facility]) 08/01/2013   a.) stage T3a, gleason 3+4;   s/p radical prostatectomy w/ node dissection 10/20/2013; b.) s/p adjuvant IMRT  03/05/2014 - 03/10/2016completed 04/10/2014)04-19-2014   S/P aortic valve replacement 10/01/2017   a.) #25 Edwards Resilia Inspiris pericardial tissue valve   S/P CABG x 2 10/01/2017   a.) LIMA-diagonal, SVG-OM   S/P radiation therapy 03/05/2014 through 04/19/2014    Prostate bed 6600 cGy in 33 sessions, pelvic lymph nodes 5000 445 cGy in 33 sessions                           Wears glasses     Past Surgical History:  Procedure Laterality Date   AORTIC VALVE REPLACEMENT N/A 10/01/2017   Procedure: AORTIC VALVE REPLACEMENT (AVR);  Surgeon: Alleen Borne, MD;  Location: [facility] OR;  Service: Open Heart Surgery;  Laterality: N/A;   ASCENDING AORTIC ROOT REPLACEMENT N/A 09/21/2018   Procedure: REPAIR OF ASCENDING AORTIC ANEURYSM USING 32 MM HEMASHIELD PLATINUM WOVEN DOUBLE VELOUR VASCULAR GRAFT WITH REATTACHMENT OF PREVIOUS SAPHENOUS VEIN GRAFT ;  Surgeon: Linden Dolin, MD;  Location: [facility] OR;  Service: Open Heart Surgery;  Laterality: N/A;   COLONOSCOPY     COLONOSCOPY WITH PROPOFOL N/A 04/19/2019   Procedure: COLONOSCOPY WITH PROPOFOL;  Surgeon: Earline Mayotte, MD;  Location: [facility] ENDOSCOPY;  Service: Gastroenterology;  Laterality: N/A;   CORONARY ARTERY BYPASS GRAFT N/A 10/01/2017   Procedure: CORONARY ARTERY BYPASS GRAFTING (CABG) x two, using left internal mammary artery and right leg greater saphenous vein harvested endoscopically;  Surgeon: Alleen Borne, MD;  Location: [facility] OR;  Service: Open Heart Surgery;  Laterality: N/A;   CORONARY ARTERY BYPASS GRAFT N/A 09/21/2018   Procedure: OPEN RIGHT SAPHENOUS VEIN HARVEST;  Surgeon: Linden Dolin, MD;  Location: [facility] OR;  Service: Open Heart Surgery;  Laterality: N/A;   LYMPHADENECTOMY Bilateral 10/20/2013   Procedure: BILATERAL PELVIC LYMPH NODE DISSECTION;  Surgeon: Sebastian Ache, MD;  Location: [facility] ORS;  Service: Urology;  Laterality: Bilateral;    ORCHIECTOMY Bilateral 08/26/2018   Procedure: ORCHIECTOMY;  Surgeon: Sebastian Ache, MD;  Location: [facility];  Service: Urology;  Laterality: Bilateral;   REVERSE SHOULDER ARTHROPLASTY Left 03/06/2021   Procedure: REVERSE SHOULDER ARTHROPLASTY;  Surgeon: Christena Flake, MD;  Location: [facility] ORS;  Service: Orthopedics;  Laterality: Left;   RIGHT/LEFT HEART CATH AND CORONARY ANGIOGRAPHY N/A 08/23/2017   Procedure: RIGHT/LEFT HEART CATH AND CORONARY ANGIOGRAPHY;  Surgeon: Marykay Lex, MD;  Location: [facility] INVASIVE CV LAB;  Service: Cardiovascular;  Laterality: N/A;   ROBOT ASSISTED LAPAROSCOPIC RADICAL PROSTATECTOMY N/A 10/20/2013   Procedure: ROBOTIC ASSISTED LAPAROSCOPIC RADICAL PROSTATECTOMY WITH INDOCYANINE GREEN DYE;  Surgeon: Sebastian Ache, MD;  Location: [facility] ORS;  Service: Urology;  Laterality: N/A;   TEE WITHOUT CARDIOVERSION N/A 10/01/2017   Procedure: TRANSESOPHAGEAL ECHOCARDIOGRAM (TEE);  Surgeon: Alleen Borne, MD;  Location: [facility] OR;  Service: Open Heart Surgery;  Laterality: N/A;   TEE WITHOUT CARDIOVERSION N/A 09/21/2018   Procedure: TRANSESOPHAGEAL ECHOCARDIOGRAM (  TEE);  Surgeon: Linden Dolin, MD;  Location: [facility] OR;  Service: Open Heart Surgery;  Laterality: N/A;   TONSILLECTOMY  child   TOTAL KNEE ARTHROPLASTY Right 12/23/2021   Procedure: TOTAL KNEE ARTHROPLASTY;  Surgeon: Christena Flake, MD;  Location: [facility] ORS;  Service: Orthopedics;  Laterality: Right;   VARICOCELECTOMY  1983    Social History   Socioeconomic History   Marital status: Divorced    Spouse name: Not on file   Number of children: 2   Years of education: Not on file   Highest education level: Not on file  Occupational History   Not on file  Tobacco Use   Smoking status: Former    Current packs/day: 0.00    Average packs/day: 1 pack/day for 20.0 years (20.0 ttl pk-yrs)    Types: Cigarettes    Start date: 02/15/1963    Quit date: 02/15/1983    Years since quitting: 39.6   Smokeless  tobacco: Never  Vaping Use   Vaping status: Never Used  Substance and Sexual Activity   Alcohol use: Not Currently    Alcohol/week: 0.0 standard drinks of alcohol    Comment: rarely    Drug use: Never   Sexual activity: Not on file  Other Topics Concern   Not on file  Social History Narrative   Not on file   Social Determinants of Health   Financial Resource Strain: Not on file  Food Insecurity: Not on file  Transportation Needs: Not on file  Physical Activity: Not on file  Stress: Not on file  Social Connections: Not on file  Intimate Partner Violence: Unknown (09/16/2017)   Humiliation, Afraid, Rape, and Kick questionnaire    Fear of Current or Ex-Partner: No    Emotionally Abused: Not on file    Physically Abused: Not on file    Sexually Abused: Not on file    Family History  Problem Relation Age of Onset   Emphysema Father    Prostate cancer Father    Breast cancer Mother     ROS: no fevers or chills, productive cough, hemoptysis, dysphasia, odynophagia, melena, hematochezia, dysuria, hematuria, rash, seizure activity, orthopnea, PND, pedal edema, claudication. Remaining systems are negative.  Physical Exam: Well-developed well-nourished in no acute distress.  Skin is warm and dry.  HEENT is normal.  Neck is supple.  Chest is clear to auscultation with normal expansion.  Cardiovascular exam is regular rate and rhythm.  2/6 systolic murmur left sternal border.  No diastolic murmur noted. Abdominal exam nontender or distended. No masses palpated. Extremities show no edema. neuro grossly intact  Electrocardiogram shows sinus rhythm, nonspecific anterior T wave changes and prolonged QT interval personally viewed.    A/P  1 status post aortic valve replacement-continue SBE prophylaxis.  Most recent echocardiogram showed normally functioning valve.  2 history of aortic root replacement secondary to aortic aneurysm/dissection-patient will need follow-up CTA March  2025.  3 coronary artery disease-status post coronary bypass and graft-continue aspirin and statin.  4 hypertension-patient's blood pressure is controlled.  Continue present medical regimen.  5 hyperlipidemia-continue statin.  Olga Millers, MD

## 2022-09-15 ENCOUNTER — Encounter: Payer: Self-pay | Admitting: Cardiology

## 2022-09-15 ENCOUNTER — Ambulatory Visit: Payer: Medicare Other | Attending: Cardiology | Admitting: Cardiology

## 2022-09-15 VITALS — BP 136/82 | HR 67 | Ht 70.0 in | Wt 214.8 lb

## 2022-09-15 DIAGNOSIS — I712 Thoracic aortic aneurysm, without rupture, unspecified: Secondary | ICD-10-CM | POA: Insufficient documentation

## 2022-09-15 DIAGNOSIS — I2510 Atherosclerotic heart disease of native coronary artery without angina pectoris: Secondary | ICD-10-CM | POA: Insufficient documentation

## 2022-09-15 DIAGNOSIS — I10 Essential (primary) hypertension: Secondary | ICD-10-CM | POA: Insufficient documentation

## 2022-09-15 DIAGNOSIS — Z952 Presence of prosthetic heart valve: Secondary | ICD-10-CM | POA: Insufficient documentation

## 2022-09-15 DIAGNOSIS — E7800 Pure hypercholesterolemia, unspecified: Secondary | ICD-10-CM | POA: Diagnosis present

## 2022-09-15 NOTE — Patient Instructions (Signed)

## 2023-01-18 ENCOUNTER — Telehealth: Payer: Self-pay | Admitting: *Deleted

## 2023-01-18 ENCOUNTER — Encounter: Payer: Self-pay | Admitting: *Deleted

## 2023-01-18 NOTE — Telephone Encounter (Signed)
1. What type of surgery is being performed?  TOTAL HIP ARTHROPLASTY   2. When is this surgery scheduled?  01/26/2023    3. Type of clearance being requested (medical, pharmacy, both)?  MEDICAL    4. Are there any medications that need to be held prior to surgery?  ASA   5. Practice name and name of physician performing surgery?  Performing surgeon: Dr. Leron Croak, MD  Requesting clearance: Danny Mulling, FNP-C     6. Anesthesia type (none, local, MAC, general)?  GENERAL   7. What is the office phone and fax number?   Phone: [phone]  Fax: [phone]

## 2023-01-18 NOTE — Telephone Encounter (Signed)
Patient Scheduled for 01-19-23

## 2023-01-18 NOTE — Telephone Encounter (Signed)
  Patient Consent for Virtual Visit   Danny Odom has provided verbal consent on 01/18/2023 for a virtual visit (video or telephone).   CONSENT FOR VIRTUAL VISIT FOR:  Danny Odom  By participating in this virtual visit I agree to the following:  I hereby voluntarily request, consent and authorize [facility] and its employed or contracted physicians, physician assistants, nurse practitioners or other licensed health care professionals (the Practitioner), to provide me with telemedicine health care services (the "Services") as deemed necessary by the treating Practitioner. I acknowledge and consent to receive the Services by the Practitioner via telemedicine. I understand that the telemedicine visit will involve communicating with the Practitioner through live audiovisual communication technology and the disclosure of certain medical information by electronic transmission. I acknowledge that I have been given the opportunity to request an in-person assessment or other available alternative prior to the telemedicine visit and am voluntarily participating in the telemedicine visit.  I understand that I have the right to withhold or withdraw my consent to the use of telemedicine in the course of my care at any time, without affecting my right to future care or treatment, and that the Practitioner or I may terminate the telemedicine visit at any time. I understand that I have the right to inspect all information obtained and/or recorded in the course of the telemedicine visit and may receive copies of available information for a reasonable fee.  I understand that some of the potential risks of receiving the Services via telemedicine include:  Delay or interruption in medical evaluation due to technological equipment failure or disruption; Information transmitted may not be sufficient (e.g. poor resolution of images) to allow for appropriate medical decision making by the Practitioner; and/or   In rare instances, security protocols could fail, causing a breach of personal health information.  Furthermore, I acknowledge that it is my responsibility to provide information about my medical history, conditions and care that is complete and accurate to the best of my ability. I acknowledge that Practitioner's advice, recommendations, and/or decision may be based on factors not within their control, such as incomplete or inaccurate data provided by me or distortions of diagnostic images or specimens that may result from electronic transmissions. I understand that the practice of medicine is not an exact science and that Practitioner makes no warranties or guarantees regarding treatment outcomes. I acknowledge that a copy of this consent can be made available to me via my patient portal [facility] MyChart), or I can request a printed copy by calling the office of [facility].    I understand that my insurance will be billed for this visit.   I have read or had this consent read to me. I understand the contents of this consent, which adequately explains the benefits and risks of the Services being provided via telemedicine.  I have been provided ample opportunity to ask questions regarding this consent and the Services and have had my questions answered to my satisfaction. I give my informed consent for the services to be provided through the use of telemedicine in my medical care

## 2023-01-18 NOTE — Telephone Encounter (Signed)
   Name: Danny Odom  DOB: January 09, 1951  MRN: [id number]  Primary Cardiologist: Olga Millers, MD   Preoperative team, please contact this patient and set up a phone call appointment for further preoperative risk assessment. Please obtain consent and complete medication review. Thank you for your help.  Can schedule 12/10 2:20 pm if possible.  I confirm that guidance regarding antiplatelet and oral anticoagulation therapy has been completed and, if necessary, noted below.  Ideally aspirin should be continued without interruption, however if the bleeding risk is too great, aspirin may be held for 5-7 days prior to surgery. Please resume aspirin post operatively when it is felt to be safe from a bleeding standpoint.    I also confirmed the patient resides in the state of [place]. As per [facility] Medical Board telemedicine laws, the patient must reside in the state in which the provider is licensed.   Carlos Levering, NP 01/18/2023, 1:24 PM [facility]

## 2023-01-18 NOTE — Telephone Encounter (Signed)
-----   Message from Verlee Monte sent at 01/16/2023  9:55 PM EST ----- Regarding: Request for pre-operative cardiac clearance Request for pre-operative cardiac clearance:  1. What type of surgery is being performed?  TOTAL HIP ARTHROPLASTY  2. When is this surgery scheduled?  01/26/2023  3. Type of clearance being requested (medical, pharmacy, both)? MEDICAL   4. Are there any medications that need to be held prior to surgery? ASA  5. Practice name and name of physician performing surgery?  Performing surgeon: Dr. Leron Croak, MD Requesting clearance: Quentin Mulling, FNP-C    6. Anesthesia type (none, local, MAC, general)? GENERAL  7. What is the office phone and fax number?   Phone: [phone] Fax: [phone]  ATTENTION: Unable to create telephone message as per your standard workflow. Directed by [facility] providers to send requests for cardiac clearance to this pool for appropriate distribution to provider covering pre-operative clearances.   Quentin Mulling, MSN, APRN, FNP-C, CEN [facility]  Peri-operative Services Nurse Practitioner Phone: [phone] 01/16/23 9:55 PM

## 2023-01-19 ENCOUNTER — Ambulatory Visit: Payer: Medicare Other | Attending: Cardiovascular Disease | Admitting: Student

## 2023-01-19 DIAGNOSIS — Z01810 Encounter for preprocedural cardiovascular examination: Secondary | ICD-10-CM

## 2023-01-19 NOTE — Progress Notes (Signed)
Virtual Visit via Telephone Note   Because of Danny Odom's co-morbid illnesses, he is at least at moderate risk for complications without adequate follow up.  This format is felt to be most appropriate for this patient at this time.  The patient did not have access to video technology/had technical difficulties with video requiring transitioning to audio format only (telephone).  All issues noted in this document were discussed and addressed.  No physical exam could be performed with this format.  Please refer to the patient's chart for his consent to telehealth for [facility].  Evaluation Performed:  Preoperative cardiovascular risk assessment _____________   Date:  01/19/2023   Patient ID:  Danny Odom, DOB 1950/06/26, MRN [id number] Patient Location:  Home Provider location:   Office  Primary Care Provider:  Danella Penton, MD Primary Cardiologist:  Olga Millers, MD  Chief Complaint / Patient Profile   72 y.o. y/o male with a h/o CAD s/p CABG x 10 September 2017, thoracic aortic dissection s/p repair August 2020, aortic valve stenosis s/p tissue AVR August 2019, prostate cancer who is pending total hip arthroplasty by Dr. Joice Lofts on 01/26/2023 and presents today for telephonic preoperative cardiovascular risk assessment.  History of Present Illness    Danny Odom is a 72 y.o. male who presents via audio/video conferencing for a telehealth visit today.  Pt was last seen in cardiology clinic on 09/15/2022 by Dr. [name].  At that time ROLF STANGLAND was stable from a cardiac standpoint.  The patient is now pending procedure as outlined above. Since his last visit, he is doing well. Patient denies shortness of breath, dyspnea on exertion, lower extremity edema, orthopnea or PND. No chest pain, pressure, or tightness. No palpitations.  His ability to exercise is hindered by his bilateral hip pain. He is independent with ADLs and is able to perform light to moderate household  activities.   Past Medical History    Past Medical History:  Diagnosis Date   Aortic arch pseudoaneurysm (HCC)    a.) s/p re-do sternotomy 09/22/2018 - 32 mm HemaShield Platnium woven double velour vascular graft   Aortic stenosis    a.) TTE 10/04/2015: mod AS (MPG 21); b.) TTE 10/11/2015: mod AS (MPG 28); c.) TTE 09/22/2016: mod AS (MPG 22); d.) TTE 08/09/2017: mod AS (MPG 30); e.) R/LHC 08/23/2017: mod AS (29.4); f.) s/p AVR (#25 Edwards Resilia Inspiris)   Arthritis    Cardiac murmur    Complication of anesthesia    ?cognitive decline   Coronary artery disease 08/23/2017   a.) R/LHC 08/23/2017: EF >65%, LVEDP 23, 95% oD1 with 90% side branch in lateral D1, 45% mLCx, 75% m-dLCx, 10% p-mLAD; b.) s/p 2v CABG 10/01/2017 (LIMA-D1, SVG-OM)   ED (erectile dysfunction)    GERD (gastroesophageal reflux disease)    Headache    History of 2019 novel coronavirus disease (COVID-19) 2023   Hyperlipidemia    Hypertension    Hypothyroidism    Nocturia    Pneumonia    Primary prostate cancer with metastasis from prostate to other site [facility]) 08/01/2013   a.) stage T3a, gleason 3+4;  s/p radical prostatectomy w/ node dissection 10/20/2013; b.) s/p adjuvant IMRT  03/05/2014 - 04/19/2014   S/P aortic valve replacement 10/01/2017   a.) #25 Edwards Resilia Inspiris pericardial tissue valve   S/P CABG x 2 10/01/2017   a.) LIMA-diagonal, SVG-OM   S/P radiation therapy 03/05/2014 through 04/19/2014    Prostate bed 6600 cGy  in 33 sessions, pelvic lymph nodes 5000 445 cGy in 33 sessions                           Wears glasses    Past Surgical History:  Procedure Laterality Date   AORTIC VALVE REPLACEMENT N/A 10/01/2017   Procedure: AORTIC VALVE REPLACEMENT (AVR);  Surgeon: Alleen Borne, MD;  Location: [facility] OR;  Service: Open Heart Surgery;  Laterality: N/A;   ASCENDING AORTIC ROOT REPLACEMENT N/A 09/21/2018   Procedure: REPAIR OF ASCENDING AORTIC ANEURYSM USING 32 MM HEMASHIELD PLATINUM WOVEN DOUBLE  VELOUR VASCULAR GRAFT WITH REATTACHMENT OF PREVIOUS SAPHENOUS VEIN GRAFT ;  Surgeon: Linden Dolin, MD;  Location: [facility] OR;  Service: Open Heart Surgery;  Laterality: N/A;   COLONOSCOPY     COLONOSCOPY WITH PROPOFOL N/A 04/19/2019   Procedure: COLONOSCOPY WITH PROPOFOL;  Surgeon: Earline Mayotte, MD;  Location: [facility] ENDOSCOPY;  Service: Gastroenterology;  Laterality: N/A;   CORONARY ARTERY BYPASS GRAFT N/A 10/01/2017   Procedure: CORONARY ARTERY BYPASS GRAFTING (CABG) x two, using left internal mammary artery and right leg greater saphenous vein harvested endoscopically;  Surgeon: Alleen Borne, MD;  Location: [facility] OR;  Service: Open Heart Surgery;  Laterality: N/A;   CORONARY ARTERY BYPASS GRAFT N/A 09/21/2018   Procedure: OPEN RIGHT SAPHENOUS VEIN HARVEST;  Surgeon: Linden Dolin, MD;  Location: [facility] OR;  Service: Open Heart Surgery;  Laterality: N/A;   LYMPHADENECTOMY Bilateral 10/20/2013   Procedure: BILATERAL PELVIC LYMPH NODE DISSECTION;  Surgeon: Sebastian Ache, MD;  Location: [facility] ORS;  Service: Urology;  Laterality: Bilateral;   ORCHIECTOMY Bilateral 08/26/2018   Procedure: ORCHIECTOMY;  Surgeon: Sebastian Ache, MD;  Location: [facility];  Service: Urology;  Laterality: Bilateral;   REVERSE SHOULDER ARTHROPLASTY Left 03/06/2021   Procedure: REVERSE SHOULDER ARTHROPLASTY;  Surgeon: Christena Flake, MD;  Location: [facility] ORS;  Service: Orthopedics;  Laterality: Left;   RIGHT/LEFT HEART CATH AND CORONARY ANGIOGRAPHY N/A 08/23/2017   Procedure: RIGHT/LEFT HEART CATH AND CORONARY ANGIOGRAPHY;  Surgeon: Marykay Lex, MD;  Location: [facility] INVASIVE CV LAB;  Service: Cardiovascular;  Laterality: N/A;   ROBOT ASSISTED LAPAROSCOPIC RADICAL PROSTATECTOMY N/A 10/20/2013   Procedure: ROBOTIC ASSISTED LAPAROSCOPIC RADICAL PROSTATECTOMY WITH INDOCYANINE GREEN DYE;  Surgeon: Sebastian Ache, MD;  Location: [facility] ORS;  Service: Urology;  Laterality: N/A;   TEE WITHOUT CARDIOVERSION N/A  10/01/2017   Procedure: TRANSESOPHAGEAL ECHOCARDIOGRAM (TEE);  Surgeon: Alleen Borne, MD;  Location: [facility] OR;  Service: Open Heart Surgery;  Laterality: N/A;   TEE WITHOUT CARDIOVERSION N/A 09/21/2018   Procedure: TRANSESOPHAGEAL ECHOCARDIOGRAM (TEE);  Surgeon: Linden Dolin, MD;  Location: [facility] OR;  Service: Open Heart Surgery;  Laterality: N/A;   TONSILLECTOMY  child   TOTAL KNEE ARTHROPLASTY Right 12/23/2021   Procedure: TOTAL KNEE ARTHROPLASTY;  Surgeon: Christena Flake, MD;  Location: [facility] ORS;  Service: Orthopedics;  Laterality: Right;   VARICOCELECTOMY  1983    Allergies  No Known Allergies  Home Medications    Prior to Admission medications   Medication Sig Start Date End Date Taking? Authorizing Provider  acetaminophen (TYLENOL) 500 MG tablet Take 1,000 mg by mouth every evening.    [provider]  aspirin EC 81 MG tablet Take 81 mg by mouth daily. Swallow whole.    [provider]  atorvastatin (LIPITOR) 40 MG tablet Take 1 tablet (40 mg total) by mouth at bedtime. 09/28/18   Linden Dolin,  MD  Cyanocobalamin (B-12) 5000 MCG CAPS Take 5,000 mcg by mouth daily.    [provider]  ibuprofen (ADVIL) 200 MG tablet Take 400 mg by mouth daily.    [provider]  levothyroxine (SYNTHROID) 100 MCG tablet Take 100 mcg by mouth daily before breakfast.    [provider]  losartan-hydrochlorothiazide (HYZAAR) 50-12.5 MG tablet Take 1 tablet by mouth 2 (two) times daily. 12/22/22 12/22/23  [provider]  metoprolol tartrate (LOPRESSOR) 25 MG tablet Take 0.5 tablets (12.5 mg total) by mouth 2 (two) times daily. 09/28/18   Linden Dolin, MD  Multiple Vitamin (MULTIVITAMIN WITH MINERALS) TABS tablet Take 1 tablet by mouth daily with breakfast. ONE-A-DAY MEN'S 50+    [provider]  pantoprazole (PROTONIX) 20 MG tablet Take 20 mg by mouth daily.    [provider]  Pseudoeph-Doxylamine-DM-APAP (NYQUIL PO) Take  30 mLs by mouth at bedtime as needed (sleep / congestion).    [provider]  zinc gluconate 50 MG tablet Take 50 mg by mouth at bedtime.     [provider]    Physical Exam    Vital Signs:  TEREZ MILLARD does not have vital signs available for review today.  Given telephonic nature of communication, physical exam is limited. AAOx3. NAD. Normal affect.  Speech and respirations are unlabored.  Accessory Clinical Findings    None  Assessment & Plan    Primary Cardiologist: Olga Millers, MD  Preoperative cardiovascular risk assessment.  Total hip arthroplasty by Dr. Joice Lofts on 01/26/2023.  Chart reviewed as part of pre-operative protocol coverage. According to the RCRI, patient has a 0.4% risk of MACE. Patient reports activity equivalent to 5.07 METS (Per [facility]).   Given past medical history and time since last visit, based on ACC/AHA guidelines, MENDY KLAPPER would be at acceptable risk for the planned procedure without further cardiovascular testing.   Patient was advised that if he develops new symptoms prior to surgery to contact our office to arrange a follow-up appointment.  he verbalized understanding.  Ideally aspirin should be continued without interruption, however if the bleeding risk is too great, aspirin may be held for 5-7 days prior to surgery. Please resume aspirin post operatively when it is felt to be safe from a bleeding standpoint.    I will route this recommendation to the requesting party via Epic fax function.  Please call with questions.  Time:   Today, I have spent 7 minutes with the patient with telehealth technology discussing medical history, symptoms, and management plan.     Carlos Levering, NP  01/19/2023, 8:27 AM

## 2023-01-20 ENCOUNTER — Encounter
Admission: RE | Admit: 2023-01-20 | Discharge: 2023-01-20 | Disposition: A | Discharge status: Home / Self Care | Payer: Medicare Other | Source: Ambulatory Visit | Attending: Surgery | Admitting: Surgery

## 2023-01-20 ENCOUNTER — Other Ambulatory Visit: Payer: Self-pay

## 2023-01-20 ENCOUNTER — Other Ambulatory Visit: Payer: Self-pay | Admitting: Surgery

## 2023-01-20 VITALS — BP 116/73 | HR 73 | Resp 16 | Ht 70.0 in | Wt 205.0 lb

## 2023-01-20 DIAGNOSIS — Z01818 Encounter for other preprocedural examination: Secondary | ICD-10-CM | POA: Diagnosis present

## 2023-01-20 HISTORY — DX: Calculus of kidney: N20.0

## 2023-01-20 LAB — URINALYSIS, ROUTINE W REFLEX MICROSCOPIC
Bilirubin Urine: NEGATIVE
Glucose, UA: NEGATIVE mg/dL
Hgb urine dipstick: NEGATIVE
Ketones, ur: NEGATIVE mg/dL
Leukocytes,Ua: NEGATIVE
Nitrite: NEGATIVE
Protein, ur: NEGATIVE mg/dL
Specific Gravity, Urine: 1.025 (ref 1.005–1.030)
pH: 5 (ref 5.0–8.0)

## 2023-01-20 LAB — COMPREHENSIVE METABOLIC PANEL
ALT: 22 U/L (ref 0–44)
AST: 22 U/L (ref 15–41)
Albumin: 4.3 g/dL (ref 3.5–5.0)
Alkaline Phosphatase: 95 U/L (ref 38–126)
Anion gap: 13 (ref 5–15)
BUN: 26 mg/dL — ABNORMAL HIGH (ref 8–23)
CO2: 26 mmol/L (ref 22–32)
Calcium: 9.4 mg/dL (ref 8.9–10.3)
Chloride: 101 mmol/L (ref 98–111)
Creatinine, Ser: 1.27 mg/dL — ABNORMAL HIGH (ref 0.61–1.24)
GFR, Estimated: 60 mL/min — ABNORMAL LOW (ref >60)
Glucose, Bld: 116 mg/dL — ABNORMAL HIGH (ref 70–99)
Potassium: 3.2 mmol/L — ABNORMAL LOW (ref 3.5–5.1)
Sodium: 140 mmol/L (ref 135–145)
Total Bilirubin: 0.9 mg/dL (ref <1.2)
Total Protein: 7.5 g/dL (ref 6.5–8.1)

## 2023-01-20 LAB — CBC WITH DIFFERENTIAL/PLATELET
Abs Immature Granulocytes: 0.01 10*3/uL (ref 0.00–0.07)
Basophils Absolute: 0 10*3/uL (ref 0.0–0.1)
Basophils Relative: 0 %
Eosinophils Absolute: 0.1 10*3/uL (ref 0.0–0.5)
Eosinophils Relative: 2 %
HCT: 42.3 % (ref 39.0–52.0)
Hemoglobin: 13.9 g/dL (ref 13.0–17.0)
Immature Granulocytes: 0 %
Lymphocytes Relative: 16 %
Lymphs Abs: 1 10*3/uL (ref 0.7–4.0)
MCH: 31.7 pg (ref 26.0–34.0)
MCHC: 32.9 g/dL (ref 30.0–36.0)
MCV: 96.6 fL (ref 80.0–100.0)
Monocytes Absolute: 0.6 10*3/uL (ref 0.1–1.0)
Monocytes Relative: 10 %
Neutro Abs: 4.6 10*3/uL (ref 1.7–7.7)
Neutrophils Relative %: 72 %
Platelets: 231 10*3/uL (ref 150–400)
RBC: 4.38 MIL/uL (ref 4.22–5.81)
RDW: 13.2 % (ref 11.5–15.5)
WBC: 6.3 10*3/uL (ref 4.0–10.5)
nRBC: 0 % (ref 0.0–0.2)

## 2023-01-20 LAB — TYPE AND SCREEN
ABO/RH(D): O POS
Antibody Screen: NEGATIVE

## 2023-01-20 LAB — SURGICAL PCR SCREEN
MRSA, PCR: NEGATIVE
Staphylococcus aureus: POSITIVE — AB

## 2023-01-20 NOTE — Patient Instructions (Addendum)
Your procedure is scheduled on: Tuesday 01/26/23 To find out your arrival time, please call [phone] between 1PM - 3PM on:   Monday 01/25/23 Report to the Registration Desk on the 1st floor of the [facility]. Free Valet parking is available.  If your arrival time is 6:00 am, do not arrive before that time as the [facility] entrance doors do not open until 6:00 am.  REMEMBER: Instructions that are not followed completely may result in serious medical risk, up to and including death; or upon the discretion of your surgeon and anesthesiologist your surgery may need to be rescheduled.  Do not eat food after midnight the night before surgery.  No gum chewing or hard candies.  You may however, drink CLEAR liquids up to 2 hours before you are scheduled to arrive for your surgery. Do not drink anything within 2 hours of your scheduled arrival time.  Clear liquids include: - water  - apple juice without pulp - gatorade (not RED colors) - black coffee or tea (Do NOT add milk or creamers to the coffee or tea) Do NOT drink anything that is not on this list.  Type 1 and Type 2 diabetics should only drink water.  In addition, your doctor has ordered for you to drink the provided:  Ensure Pre-Surgery Clear Carbohydrate Drink  Drinking this carbohydrate drink up to two hours before surgery helps to reduce insulin resistance and improve patient outcomes. Please complete drinking 2 hours before scheduled arrival time.  One week prior to surgery: continue holding Aspirin as instructed. Stop Anti-inflammatories (NSAIDS) such as Advil, Aleve, Ibuprofen, Motrin, Naproxen, Naprosyn and Aspirin based products such as Excedrin, Goody's Powder, BC Powder. You may however, continue to take Tylenol if needed for pain up until the day of surgery.  Stop ANY OVER THE COUNTER supplements and vitamins TODAY 01/20/23 until after surgery.  Continue taking all prescribed medications.  TAKE ONLY THESE  MEDICATIONS THE MORNING OF SURGERY WITH A SIP OF WATER:  levothyroxine (SYNTHROID) 100 MCG tablet  metoprolol tartrate (LOPRESSOR) 25 MG tablet  pantoprazole (PROTONIX) 20 MG tablet Antacid (take one the night before and one on the morning of surgery - helps to prevent nausea after surgery.)  No Alcohol for 24 hours before or after surgery.  No Smoking including e-cigarettes for 24 hours before surgery.  No chewable tobacco products for at least 6 hours before surgery.  No nicotine patches on the day of surgery.  Do not use any "recreational" drugs for at least a week (preferably 2 weeks) before your surgery.  Please be advised that the combination of cocaine and anesthesia may have negative outcomes, up to and including death. If you test positive for cocaine, your surgery will be cancelled.  On the morning of surgery brush your teeth with toothpaste and water, you may rinse your mouth with mouthwash if you wish. Do not swallow any toothpaste or mouthwash.  Use CHG Soap or wipes as directed on instruction sheet. See below  Do not wear lotions, powders, or perfumes.   Do not shave body hair from the neck down 48 hours before surgery.  Wear comfortable clothing (specific to your surgery type) to the hospital.  Do not wear jewelry, make-up, hairpins, clips or nail polish.  For welded (permanent) jewelry: bracelets, anklets, waist bands, etc.  Please have this removed prior to surgery.  If it is not removed, there is a chance that hospital personnel will need to cut it off on the day  of surgery. Contact lenses, hearing aids and dentures may not be worn into surgery.  Do not bring valuables to the hospital. [facility] is not responsible for any missing/lost belongings or valuables.   Notify your doctor if there is any change in your medical condition (cold, fever, infection).  If you are being discharged the day of surgery, you will not be allowed to drive home. You will need a  responsible individual to drive you home and stay with you for 24 hours after surgery.   If you are taking public transportation, you will need to have a responsible individual with you.  If you are being admitted to the hospital overnight, leave your suitcase in the car. After surgery it may be brought to your room.  In case of increased patient census, it may be necessary for you, the patient, to continue your postoperative care in the Same Day Surgery department.  After surgery, you can help prevent lung complications by doing breathing exercises.  Take deep breaths and cough every 1-2 hours. Your doctor may order a device called an Incentive Spirometer to help you take deep breaths. When coughing or sneezing, hold a pillow firmly against your incision with both hands. This is called "splinting." Doing this helps protect your incision. It also decreases belly discomfort.  Surgery Visitation Policy:  Patients undergoing a surgery or procedure may have two family members or support persons with them as long as the person is not COVID-19 positive or experiencing its symptoms.   Inpatient Visitation:    Visiting hours are 7 a.m. to 8 p.m. Up to four visitors are allowed at one time in a patient room. The visitors may rotate out with other people during the day. One designated support person (adult) may remain overnight.  Please call the Pre-admissions Testing Dept. at [phone] if you have any questions about these instructions.    Pre-operative 5 CHG Bath Instructions   You can play a key role in reducing the risk of infection after surgery. Your skin needs to be as free of germs as possible. You can reduce the number of germs on your skin by washing with CHG (chlorhexidine gluconate) soap before surgery. CHG is an antiseptic soap that kills germs and continues to kill germs even after washing.   DO NOT use if you have an allergy to chlorhexidine/CHG or antibacterial soaps. If your  skin becomes reddened or irritated, stop using the CHG and notify one of our RNs at [phone].   Please shower with the CHG soap starting 4 days before surgery using the following schedule:  FRIDAY 01/22/23 - TUESDAY 01/26/23     Please keep in mind the following:  DO NOT shave, including legs and underarms, starting the day of your first shower.   You may shave your face at any point before/day of surgery.  Place clean sheets on your bed the day you start using CHG soap. Use a clean washcloth (not used since being washed) for each shower. DO NOT sleep with pets once you start using the CHG.   CHG Shower Instructions:  If you choose to wash your hair and private area, wash first with your normal shampoo/soap.  After you use shampoo/soap, rinse your hair and body thoroughly to remove shampoo/soap residue.  Turn the water OFF and apply about 3 tablespoons (45 ml) of CHG soap to a CLEAN washcloth.  Apply CHG soap ONLY FROM YOUR NECK DOWN TO YOUR TOES (washing for 3-5 minutes)  DO  NOT use CHG soap on face, private areas, open wounds, or sores.  Pay special attention to the area where your surgery is being performed.  If you are having back surgery, having someone wash your back for you may be helpful. Wait 2 minutes after CHG soap is applied, then you may rinse off the CHG soap.  Pat dry with a clean towel  Put on clean clothes/pajamas   If you choose to wear lotion, please use ONLY the CHG-compatible lotions on the back of this paper.     Additional instructions for the day of surgery: DO NOT APPLY any lotions, deodorants, cologne, or perfumes.   Put on clean/comfortable clothes.  Brush your teeth.  Ask your nurse before applying any prescription medications to the skin.      CHG Compatible Lotions   Aveeno Moisturizing lotion  Cetaphil Moisturizing Cream  Cetaphil Moisturizing Lotion  Clairol Herbal Essence Moisturizing Lotion, Dry Skin  Clairol Herbal Essence  Moisturizing Lotion, Extra Dry Skin  Clairol Herbal Essence Moisturizing Lotion, Normal Skin  Curel Age Defying Therapeutic Moisturizing Lotion with Alpha Hydroxy  Curel Extreme Care Body Lotion  Curel Soothing Hands Moisturizing Hand Lotion  Curel Therapeutic Moisturizing Cream, Fragrance-Free  Curel Therapeutic Moisturizing Lotion, Fragrance-Free  Curel Therapeutic Moisturizing Lotion, Original Formula  Eucerin Daily Replenishing Lotion  Eucerin Dry Skin Therapy Plus Alpha Hydroxy Crme  Eucerin Dry Skin Therapy Plus Alpha Hydroxy Lotion  Eucerin Original Crme  Eucerin Original Lotion  Eucerin Plus Crme Eucerin Plus Lotion  Eucerin TriLipid Replenishing Lotion  Keri Anti-Bacterial Hand Lotion  Keri Deep Conditioning Original Lotion Dry Skin Formula Softly Scented  Keri Deep Conditioning Original Lotion, Fragrance Free Sensitive Skin Formula  Keri Lotion Fast Absorbing Fragrance Free Sensitive Skin Formula  Keri Lotion Fast Absorbing Softly Scented Dry Skin Formula  Keri Original Lotion  Keri Skin Renewal Lotion Keri Silky Smooth Lotion  Keri Silky Smooth Sensitive Skin Lotion  Nivea Body Creamy Conditioning Oil  Nivea Body Extra Enriched Teacher, adult education Moisturizing Lotion Nivea Crme  Nivea Skin Firming Lotion  NutraDerm 30 Skin Lotion  NutraDerm Skin Lotion  NutraDerm Therapeutic Skin Cream  NutraDerm Therapeutic Skin Lotion  ProShield Protective Hand Cream  Provon moisturizing lotion

## 2023-01-22 NOTE — Progress Notes (Signed)
This encounter was created in error - please disregard.

## 2023-01-25 MED ORDER — ORAL CARE MOUTH RINSE
15.0000 mL | Freq: Once | OROMUCOSAL | Status: AC
Start: 2023-01-25 — End: 2023-01-26

## 2023-01-25 MED ORDER — CEFAZOLIN SODIUM-DEXTROSE 2-4 GM/100ML-% IV SOLN
2.0000 g | INTRAVENOUS | Status: AC
  Administered 2023-01-26: 2 g via INTRAVENOUS

## 2023-01-25 MED ORDER — LACTATED RINGERS IV SOLN: INTRAVENOUS | Status: DC

## 2023-01-25 MED ORDER — CHLORHEXIDINE GLUCONATE 0.12 % MT SOLN
15.0000 mL | Freq: Once | OROMUCOSAL | Status: AC
Start: 2023-01-25 — End: 2023-01-26
  Administered 2023-01-26: 15 mL via OROMUCOSAL

## 2023-01-25 MED ORDER — TRANEXAMIC ACID-NACL 1000-0.7 MG/100ML-% IV SOLN
1000.0000 mg | INTRAVENOUS | Status: AC
  Administered 2023-01-26: 1000 mg via INTRAVENOUS

## 2023-01-26 ENCOUNTER — Ambulatory Visit: Payer: Medicare Other | Admitting: Anesthesiology

## 2023-01-26 ENCOUNTER — Encounter: Payer: Self-pay | Admitting: Surgery

## 2023-01-26 ENCOUNTER — Other Ambulatory Visit: Payer: Self-pay

## 2023-01-26 ENCOUNTER — Ambulatory Visit: Payer: Medicare Other | Admitting: Urgent Care

## 2023-01-26 ENCOUNTER — Ambulatory Visit: Payer: Medicare Other

## 2023-01-26 ENCOUNTER — Encounter
Admission: RE | Disposition: A | Discharge status: Home / Self Care | Payer: Self-pay | Source: Ambulatory Visit | Attending: Surgery

## 2023-01-26 ENCOUNTER — Ambulatory Visit
Admission: RE | Admit: 2023-01-26 | Discharge: 2023-01-26 | Disposition: A | Discharge status: Home / Self Care | Payer: Medicare Other | Source: Ambulatory Visit | Attending: Surgery | Admitting: Surgery

## 2023-01-26 DIAGNOSIS — Z951 Presence of aortocoronary bypass graft: Secondary | ICD-10-CM | POA: Diagnosis not present

## 2023-01-26 DIAGNOSIS — M1611 Unilateral primary osteoarthritis, right hip: Secondary | ICD-10-CM | POA: Diagnosis present

## 2023-01-26 DIAGNOSIS — I10 Essential (primary) hypertension: Secondary | ICD-10-CM | POA: Insufficient documentation

## 2023-01-26 DIAGNOSIS — Z8546 Personal history of malignant neoplasm of prostate: Secondary | ICD-10-CM | POA: Insufficient documentation

## 2023-01-26 DIAGNOSIS — M419 Scoliosis, unspecified: Secondary | ICD-10-CM | POA: Diagnosis not present

## 2023-01-26 DIAGNOSIS — K219 Gastro-esophageal reflux disease without esophagitis: Secondary | ICD-10-CM | POA: Diagnosis not present

## 2023-01-26 DIAGNOSIS — I2510 Atherosclerotic heart disease of native coronary artery without angina pectoris: Secondary | ICD-10-CM | POA: Insufficient documentation

## 2023-01-26 DIAGNOSIS — Z952 Presence of prosthetic heart valve: Secondary | ICD-10-CM | POA: Insufficient documentation

## 2023-01-26 HISTORY — PX: TOTAL HIP ARTHROPLASTY: SHX124

## 2023-01-26 SURGERY — ARTHROPLASTY, HIP, TOTAL,POSTERIOR APPROACH
Anesthesia: Spinal | Site: Hip | Laterality: Right

## 2023-01-26 MED ORDER — OXYCODONE HCL 5 MG PO TABS: 5.0000 mg | ORAL_TABLET | ORAL | Status: DC | PRN

## 2023-01-26 MED ORDER — OXYCODONE HCL 5 MG/5ML PO SOLN
5.0000 mg | Freq: Once | ORAL | Status: DC | PRN
Start: 2023-01-26 — End: 2023-01-26

## 2023-01-26 MED ORDER — DEXTROSE-SODIUM CHLORIDE 5-0.9 % IV SOLN: INTRAVENOUS | Status: DC

## 2023-01-26 MED ORDER — FENTANYL CITRATE (PF) 100 MCG/2ML IJ SOLN: 25.0000 ug | INTRAMUSCULAR | Status: DC | PRN

## 2023-01-26 MED ORDER — EPHEDRINE SULFATE-NACL 50-0.9 MG/10ML-% IV SOSY
PREFILLED_SYRINGE | INTRAVENOUS | Status: DC | PRN
  Administered 2023-01-26 (×5): 10 mg via INTRAVENOUS

## 2023-01-26 MED ORDER — PHENYLEPHRINE HCL-NACL 20-0.9 MG/250ML-% IV SOLN
INTRAVENOUS | Status: AC
  Filled 2023-01-26: qty 250

## 2023-01-26 MED ORDER — TRANEXAMIC ACID-NACL 1000-0.7 MG/100ML-% IV SOLN
INTRAVENOUS | Status: AC
  Filled 2023-01-26: qty 100

## 2023-01-26 MED ORDER — TRIAMCINOLONE ACETONIDE 40 MG/ML IJ SUSP
INTRAMUSCULAR | Status: DC | PRN
  Administered 2023-01-26: 40 mg via INTRAMUSCULAR

## 2023-01-26 MED ORDER — ACETAMINOPHEN 325 MG PO TABS
325.0000 mg | ORAL_TABLET | Freq: Four times a day (QID) | ORAL | Status: DC | PRN
Start: 2023-01-27 — End: 2023-01-26

## 2023-01-26 MED ORDER — MIDAZOLAM HCL 5 MG/5ML IJ SOLN
INTRAMUSCULAR | Status: DC | PRN
  Administered 2023-01-26: 1 mg via INTRAVENOUS

## 2023-01-26 MED ORDER — METOCLOPRAMIDE HCL 10 MG PO TABS: 5.0000 mg | ORAL_TABLET | Freq: Three times a day (TID) | ORAL | Status: DC | PRN

## 2023-01-26 MED ORDER — SODIUM CHLORIDE 0.9 % BOLUS PEDS: 250.0000 mL | Freq: Once | INTRAVENOUS | Status: DC

## 2023-01-26 MED ORDER — ONDANSETRON HCL 4 MG/2ML IJ SOLN: 4.0000 mg | Freq: Four times a day (QID) | INTRAMUSCULAR | Status: DC | PRN

## 2023-01-26 MED ORDER — CEFAZOLIN SODIUM-DEXTROSE 2-4 GM/100ML-% IV SOLN
INTRAVENOUS | Status: AC
  Filled 2023-01-26: qty 100

## 2023-01-26 MED ORDER — BUPIVACAINE-EPINEPHRINE (PF) 0.5% -1:200000 IJ SOLN
INTRAMUSCULAR | Status: DC | PRN
  Administered 2023-01-26: 30 mL via PERINEURAL

## 2023-01-26 MED ORDER — CELECOXIB 200 MG PO CAPS: 200.0000 mg | ORAL_CAPSULE | Freq: Two times a day (BID) | ORAL | 1 refills | Status: AC

## 2023-01-26 MED ORDER — PHENYLEPHRINE 80 MCG/ML (10ML) SYRINGE FOR IV PUSH (FOR BLOOD PRESSURE SUPPORT): PREFILLED_SYRINGE | INTRAVENOUS | Status: DC | PRN

## 2023-01-26 MED ORDER — FENTANYL CITRATE (PF) 100 MCG/2ML IJ SOLN
INTRAMUSCULAR | Status: AC
  Filled 2023-01-26: qty 2

## 2023-01-26 MED ORDER — MIDAZOLAM HCL 2 MG/2ML IJ SOLN
INTRAMUSCULAR | Status: AC
  Filled 2023-01-26: qty 2

## 2023-01-26 MED ORDER — KETOROLAC TROMETHAMINE 15 MG/ML IJ SOLN
INTRAMUSCULAR | Status: AC
  Filled 2023-01-26: qty 1

## 2023-01-26 MED ORDER — KETOROLAC TROMETHAMINE 15 MG/ML IJ SOLN
15.0000 mg | Freq: Once | INTRAMUSCULAR | Status: AC
  Administered 2023-01-26: 15 mg via INTRAVENOUS

## 2023-01-26 MED ORDER — TRIAMCINOLONE ACETONIDE 40 MG/ML IJ SUSP
INTRAMUSCULAR | Status: AC
  Filled 2023-01-26: qty 1

## 2023-01-26 MED ORDER — BUPIVACAINE-EPINEPHRINE (PF) 0.5% -1:200000 IJ SOLN
INTRAMUSCULAR | Status: AC
  Filled 2023-01-26: qty 30

## 2023-01-26 MED ORDER — ONDANSETRON HCL 4 MG/2ML IJ SOLN: 4.0000 mg | Freq: Once | INTRAMUSCULAR | Status: DC | PRN

## 2023-01-26 MED ORDER — OXYCODONE HCL 5 MG PO TABS: 5.0000 mg | ORAL_TABLET | ORAL | 0 refills | Status: AC | PRN

## 2023-01-26 MED ORDER — SODIUM CHLORIDE 0.9 % BOLUS PEDS
250.0000 mL | Freq: Once | INTRAVENOUS | Status: AC
  Administered 2023-01-26: 250 mL via INTRAVENOUS

## 2023-01-26 MED ORDER — GLYCOPYRROLATE 0.2 MG/ML IJ SOLN
INTRAMUSCULAR | Status: AC
  Filled 2023-01-26: qty 1

## 2023-01-26 MED ORDER — CHLORHEXIDINE GLUCONATE 0.12 % MT SOLN
OROMUCOSAL | Status: AC
  Filled 2023-01-26: qty 15

## 2023-01-26 MED ORDER — ACETAMINOPHEN 10 MG/ML IV SOLN
INTRAVENOUS | Status: AC
  Filled 2023-01-26: qty 100

## 2023-01-26 MED ORDER — PROPOFOL 1000 MG/100ML IV EMUL
INTRAVENOUS | Status: AC
  Filled 2023-01-26: qty 100

## 2023-01-26 MED ORDER — CEFAZOLIN SODIUM-DEXTROSE 2-4 GM/100ML-% IV SOLN
2.0000 g | Freq: Four times a day (QID) | INTRAVENOUS | Status: DC
  Administered 2023-01-26: 2 g via INTRAVENOUS

## 2023-01-26 MED ORDER — PHENYLEPHRINE 80 MCG/ML (10ML) SYRINGE FOR IV PUSH (FOR BLOOD PRESSURE SUPPORT)
PREFILLED_SYRINGE | INTRAVENOUS | Status: DC | PRN
  Administered 2023-01-26 (×2): 80 ug via INTRAVENOUS

## 2023-01-26 MED ORDER — 0.9 % SODIUM CHLORIDE (POUR BTL) OPTIME
TOPICAL | Status: DC | PRN
  Administered 2023-01-26: 1000 mL

## 2023-01-26 MED ORDER — PHENYLEPHRINE HCL-NACL 20-0.9 MG/250ML-% IV SOLN
INTRAVENOUS | Status: DC | PRN
  Administered 2023-01-26: 40 ug/min via INTRAVENOUS
  Administered 2023-01-26 (×2): 80 ug via INTRAVENOUS

## 2023-01-26 MED ORDER — FENTANYL CITRATE (PF) 100 MCG/2ML IJ SOLN
INTRAMUSCULAR | Status: DC | PRN
  Administered 2023-01-26 (×2): 50 ug via INTRAVENOUS

## 2023-01-26 MED ORDER — ONDANSETRON HCL 4 MG PO TABS: 4.0000 mg | ORAL_TABLET | Freq: Four times a day (QID) | ORAL | Status: DC | PRN

## 2023-01-26 MED ORDER — GLYCOPYRROLATE 0.2 MG/ML IJ SOLN
INTRAMUSCULAR | Status: DC | PRN
  Administered 2023-01-26: .2 mg via INTRAVENOUS

## 2023-01-26 MED ORDER — SODIUM CHLORIDE 0.9 % IV SOLN
INTRAVENOUS | Status: DC | PRN
  Administered 2023-01-26: 60 mL

## 2023-01-26 MED ORDER — ACETAMINOPHEN 10 MG/ML IV SOLN
1000.0000 mg | Freq: Once | INTRAVENOUS | Status: DC | PRN
  Administered 2023-01-26: 1000 mg via INTRAVENOUS

## 2023-01-26 MED ORDER — PROPOFOL 500 MG/50ML IV EMUL
INTRAVENOUS | Status: DC | PRN
  Administered 2023-01-26: 75 ug/kg/min via INTRAVENOUS

## 2023-01-26 MED ORDER — METOCLOPRAMIDE HCL 5 MG/ML IJ SOLN: 5.0000 mg | Freq: Three times a day (TID) | INTRAMUSCULAR | Status: DC | PRN

## 2023-01-26 MED ORDER — OXYCODONE HCL 5 MG PO TABS: 5.0000 mg | ORAL_TABLET | Freq: Once | ORAL | Status: DC | PRN

## 2023-01-26 MED ORDER — KETOROLAC TROMETHAMINE 30 MG/ML IJ SOLN
INTRAMUSCULAR | Status: DC | PRN
  Administered 2023-01-26: 30 mg via INTRAMUSCULAR

## 2023-01-26 MED ORDER — SODIUM CHLORIDE FLUSH 0.9 % IV SOLN
INTRAVENOUS | Status: AC
  Filled 2023-01-26: qty 40

## 2023-01-26 MED ORDER — BUPIVACAINE LIPOSOME 1.3 % IJ SUSP
INTRAMUSCULAR | Status: AC
  Filled 2023-01-26: qty 20

## 2023-01-26 SURGICAL SUPPLY — 52 items
BLADE SAGITTAL WIDE XTHICK NO (BLADE) ×1 IMPLANT
BLADE SURG SZ20 CARB STEEL (BLADE) ×1 IMPLANT
CHLORAPREP W/TINT 26 (MISCELLANEOUS) ×1 IMPLANT
DRAPE IMP U-DRAPE 54X76 (DRAPES) IMPLANT
DRAPE INCISE IOBAN 66X60 STRL (DRAPES) ×1 IMPLANT
DRAPE SHEET LG 3/4 BI-LAMINATE (DRAPES) ×1 IMPLANT
DRAPE SURG 17X11 SM STRL (DRAPES) ×2 IMPLANT
DRSG MEPILEX SACRM 8.7X9.8 (GAUZE/BANDAGES/DRESSINGS) ×1 IMPLANT
DRSG OPSITE POSTOP 4X10 (GAUZE/BANDAGES/DRESSINGS) ×1 IMPLANT
ELECT CAUTERY BLADE 6.4 (BLADE) ×1 IMPLANT
GAUZE 4X4 16PLY ~~LOC~~+RFID DBL (SPONGE) ×1 IMPLANT
GAUZE XEROFORM 1X8 LF (GAUZE/BANDAGES/DRESSINGS) ×1 IMPLANT
GLOVE BIO SURGEON STRL SZ7.5 (GLOVE) ×4 IMPLANT
GLOVE BIO SURGEON STRL SZ8 (GLOVE) ×4 IMPLANT
GLOVE BIOGEL PI IND STRL 8 (GLOVE) ×1 IMPLANT
GLOVE INDICATOR 8.0 STRL GRN (GLOVE) ×1 IMPLANT
GOWN STRL REUS W/ TWL LRG LVL3 (GOWN DISPOSABLE) ×1 IMPLANT
GOWN STRL REUS W/ TWL XL LVL3 (GOWN DISPOSABLE) ×1 IMPLANT
HANDLE YANKAUER SUCT OPEN TIP (MISCELLANEOUS) ×1 IMPLANT
HEAD CERAMIC BIOLOX 36 T1 +3 (Head) IMPLANT
HIP SHELL ACETAB 3H 56MM F (Shell) ×1 IMPLANT
HOLSTER ELECTROSUGICAL PENCIL (MISCELLANEOUS) ×1 IMPLANT
HOOD PEEL AWAY T7 (MISCELLANEOUS) ×3 IMPLANT
IV NS IRRIG 3000ML ARTHROMATIC (IV SOLUTION) ×1 IMPLANT
KIT TURNOVER KIT A (KITS) ×1 IMPLANT
LINER ACE G7 36 SZF HIGH WALL (Liner) IMPLANT
MANIFOLD NEPTUNE II (INSTRUMENTS) ×1 IMPLANT
NDL FILTER BLUNT 18X1 1/2 (NEEDLE) ×1 IMPLANT
NDL SAFETY ECLIPSE 18X1.5 (NEEDLE) ×2 IMPLANT
NDL SPNL 20GX3.5 QUINCKE YW (NEEDLE) ×1 IMPLANT
NEEDLE FILTER BLUNT 18X1 1/2 (NEEDLE) ×1 IMPLANT
NEEDLE SPNL 20GX3.5 QUINCKE YW (NEEDLE) ×1 IMPLANT
PACK HIP PROSTHESIS (MISCELLANEOUS) ×1 IMPLANT
PENCIL SMOKE EVACUATOR (MISCELLANEOUS) ×1 IMPLANT
PIN STEINMANN 3/16X9 BAY 6PK (Pin) ×2 IMPLANT
PULSAVAC PLUS IRRIG FAN TIP (DISPOSABLE) ×1
SHELL ACETAB HIP 3H 56MM F (Shell) IMPLANT
SPONGE T-LAP 18X18 ~~LOC~~+RFID (SPONGE) ×5 IMPLANT
ST PIN 3/16X9 BAY 6PK (Pin) ×2 IMPLANT
STAPLER SKIN PROX 35W (STAPLE) ×1 IMPLANT
STEM COLLARLESS FULL 14X150MM (Stem) IMPLANT
SUT TICRON 2-0 30IN 311381 (SUTURE) ×3 IMPLANT
SUT VIC AB 0 CT1 36 (SUTURE) ×1 IMPLANT
SUT VIC AB 1 CT1 36 (SUTURE) ×1 IMPLANT
SUT VIC AB 2-0 CT1 (SUTURE) ×3 IMPLANT
SYR 10ML LL (SYRINGE) ×1 IMPLANT
SYR 20ML LL LF (SYRINGE) ×1 IMPLANT
SYR 30ML LL (SYRINGE) ×2 IMPLANT
TIP FAN IRRIG PULSAVAC PLUS (DISPOSABLE) ×1 IMPLANT
TRAP FLUID SMOKE EVACUATOR (MISCELLANEOUS) ×2 IMPLANT
WATER STERILE IRR 1000ML POUR (IV SOLUTION) ×1 IMPLANT
WATER STERILE IRR 500ML POUR (IV SOLUTION) ×1 IMPLANT

## 2023-01-26 NOTE — Transfer of Care (Signed)
Immediate Anesthesia Transfer of Care Note  Patient: Danny Odom  Procedure(s) Performed: TOTAL HIP ARTHROPLASTY (Right: Hip)  Patient Location: PACU  Anesthesia Type:Spinal  Level of Consciousness: awake, alert , and oriented  Airway & Oxygen Therapy: Patient Spontanous Breathing and Patient connected to nasal cannula oxygen  Post-op Assessment: Report given to RN and Post -op Vital signs reviewed and stable  Post vital signs: stable  Last Vitals:  Vitals Value Taken Time  BP 139/103 01/26/23 1240  Temp 97   Pulse 64 01/26/23 1242  Resp 18 01/26/23 1242  SpO2 99 % 01/26/23 1242  Vitals shown include unfiled device data.  Last Pain:  Vitals:   01/26/23 0833  TempSrc: Temporal  PainSc: 0-No pain      Patients Stated Pain Goal: 0 (01/26/23 7829)  Complications: No notable events documented.

## 2023-01-26 NOTE — Anesthesia Procedure Notes (Addendum)
Spinal  Patient location during procedure: OR Start time: 01/26/2023 10:00 AM End time: 01/26/2023 10:20 AM Reason for block: surgical anesthesia Staffing Performed: anesthesiologist, resident/CRNA and other anesthesia staff  Anesthesiologist: Corinda Gubler, MD Resident/CRNA: Darrell Jewel I, CRNA Other anesthesia staff: Lenard Simmer, MD Performed by: Darrell Jewel I, CRNA Authorized by: Darrell Jewel I, CRNA   Preanesthetic Checklist Completed: patient identified, IV checked, site marked, risks and benefits discussed, surgical consent, monitors and equipment checked, pre-op evaluation and timeout performed Spinal Block Patient position: sitting Prep: DuraPrep Patient monitoring: heart rate, cardiac monitor, continuous pulse ox and blood pressure Approach: midline Location: L3-4 Injection technique: single-shot Needle Needle type: Quincke  Needle gauge: 22 G Needle length: 9 cm Assessment Sensory level: T6 Events: CSF return and second provider Additional Notes IV functioning, monitors applied to pt. Expiration date of kit checked and confirmed to be in date. Sterile prep and drape, hand hygiene and sterile gloved used. Pt was positioned and spine was prepped in sterile fashion. Skin was anesthetized with lidocaine. Difficult spinal placement due to scoliosis - unsuccessful attempts by [facility] CRNA, Suzan Slick MD, followed by successful attempt by Karlton Lemon MD. Free flow of clear CSF obtained prior to injecting local anesthetic into CSF  Spinal needle aspirated freely following injection. Needle was carefully withdrawn, and pt tolerated procedure well. Loss of motor and sensory on exam post injection.

## 2023-01-26 NOTE — Op Note (Signed)
01/26/2023  12:20 PM  Patient:   Danny Odom  Pre-Op Diagnosis:   Degenerative joint disease, right hip.  Post-Op Diagnosis:   Same.  Procedure:   Right total hip arthroplasty.  Surgeon:   Maryagnes Amos, MD  Assistant:   Horris Latino, PA-C  Anesthesia:   Spinal  Findings:   As above.  Complications:   None  EBL:   150 cc  Fluids:   1300 cc crystalloid  UOP:   None  TT:   None  Drains:   None  Closure:   Staples  Implants:   Biomet press-fit system with a #14 laterally offset Echo femoral stem, a 56 mm acetabular shell with an E-poly hi-wall liner, and a 36 mm ceramic head with a +3 mm neck.  Brief Clinical Note:   The patient is a 72 year old male with a history of progressively worsening right hip/groin pain. His symptoms have progressed despite medications, activity modification, etc. His history and examination are consistent with advanced degenerative joint disease, confirmed by plain radiographs. The patient presents at this time for a right total hip arthroplasty.   Procedure:   The patient was brought into the operating room. After adequate spinal anesthesia was obtained, the patient was repositioned in the left lateral decubitus position and secured using a lateral hip positioner. The right hip and lower extremity were prepped with ChloroPrep solution before being draped sterilely. Preoperative antibiotics were administered. A timeout was performed to verify the appropriate surgical site.    A standard posterior approach to the hip was made through an approximately 6-7 inch incision. The incision was carried down through the subcutaneous tissues to expose the gluteal fascia and proximal end of the iliotibial band. These structures were split the length of the incision and the Charnley self-retaining hip retractor placed. The bursal tissues were swept posteriorly to expose the short external rotators. The anterior border of the piriformis tendon was identified and  this plane developed down through the capsule to enter the joint. A flap of tissue was elevated off the posterior aspect of the femoral neck and greater trochanter and retracted posteriorly. This flap included the piriformis tendon, the short external rotators, and the posterior capsule. The soft tissues were elevated off the lateral aspect of the ilium and a large Steinmann pin placed bicortically.   With the right leg aligned over the left, a drill bit was placed into the greater trochanter parallel to the Steinmann pin and the distance between these two pins measured in order to optimize leg lengths postoperatively. The drill bit was removed and the hip dislocated. The piriformis fossa was debrided of soft tissues before the intramedullary canal was accessed through this point using a triple step reamer. The canal was reamed sequentially beginning with a #7 tapered reamer and progressing to a #14 tapered reamer. This provided excellent circumferential chatter. Using the appropriate guide, a femoral neck cut was made 10-12 mm above the lesser trochanter. The femoral head was removed.  Attention was directed to the acetabular side. The labrum was debrided circumferentially before the ligamentum teres was removed using a large curette. A line was drawn on the drapes corresponding to the native version of the acetabulum. This line was used as a guide while the acetabulum was reamed sequentially beginning with a 47 mm reamer and progressing to a 55 mm reamer. This provided excellent circumferential chatter. The 55 mm trial acetabulum was positioned and found to fit quite well. Therefore, the 56 mm acetabular  shell was selected and impacted into place with care taken to maintain the appropriate version. The trial high wall liner was inserted.  Attention was redirected to the femoral side. A box osteotome was used to establish version before the canal was broached sequentially beginning with a #7 broach and  progressing to a #14 broach. This was left in place and several trial reductions performed using both the standard and laterally offset neck options, as well as the 16 mm, +0 mm, and +3 mm neck lengths. After removing the trial components, the "manhole cover" was placed into the apex of the acetabular shell and tightened securely. The permanent E-polyethylene hi-wall liner was impacted into the acetabular shell and its locking mechanism verified using a quarter-inch osteotome. Next, the #14 laterally offset femoral stem was impacted into place with care taken to maintain the appropriate version. A repeat trial reduction was performed using the +0 mm and +3 mm neck lengths. The +3 mm neck length demonstrated excellent stability both in extension and external rotation as well as with flexion to 90 and internal rotation beyond 70. It also was stable in the position of sleep. In addition, leg lengths appeared to be restored appropriately, both by reassessing the position of the right leg over the left, as well as by measuring the distance between the Steinmann pin and the drill bit. The 36 mm ceramic head with the +3 mm neck was impacted onto the stem of the femoral component. The Morse taper locking mechanism was verified using manual distraction before the head was relocated and placed through a range of motion with the findings as described above.  The wound was copiously irrigated with sterile saline solution via the jet lavage system before the peri-incisional and pericapsular tissues were injected with a "cocktail" of 20 cc of Exparel, 30 cc of 0.5% Sensorcaine, 2 cc of Kenalog 40 (80 mg), and 30 mg of Toradol diluted out to 90 cc with normal saline to help with postoperative analgesia. The posterior flap was reapproximated to the posterior aspect of the greater trochanter using #2 Tycron interrupted sutures placed through drill holes. Several additional #2 Tycron interrupted sutures were used to reinforce  this layer of closure. The iliotibial band was reapproximated using #1 Vicryl interrupted sutures before the gluteal fascia was closed using a running #1 Vicryl suture. At this point, 1 g of transexemic acid in 10 cc of normal saline was injected into the joint to help reduce postoperative bleeding. The subcutaneous tissues were closed in several layers using 2-0 Vicryl interrupted sutures before the skin was closed using staples. A sterile occlusive dressing was applied to the wound. The patient was then rolled back into the supine position on his hospital bed before being awakened and returned to the recovery room in satisfactory condition after tolerating the procedure well.

## 2023-01-26 NOTE — Discharge Instructions (Addendum)
Orthopedic discharge instructions: May shower with intact OpSite dressing. Apply ice frequently to knee or use Polar Care. Take aspirin 325 mg (or 4 baby aspirin) twice daily for 6 weeks. Start Celebrex 200 mg twice daily with food for 2 weeks, then 200 mg daily with food thereafter as necessary. Take pain medication as prescribed and/or ES Tylenol if necessary. May weight-bear as tolerated on right leg - use walker for balance and support. Follow-up in 10-14 days or as scheduled.
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
## 2023-01-26 NOTE — H&P (Signed)
History of Present Illness: Danny Odom is a 72 y.o. who presents today for history and physical. He is to undergo a right total hip arthroplasty on 01/26/2023. Since his last visit here to clinic there have been no improvement in his condition. The patient expresses his desire to proceed with surgery.  Patient has been experiencing bilateral hip pain, right more symptomatic than left. His right hip symptoms have been present for 5 to 6 months, while his left hip symptoms developed only a few weeks ago. He denies any specific injury to either hip, but notes that he does have significant degenerative disc disease of his lumbar spine with an asymmetric scoliosis that may have contributed to the development of the symptoms. He localizes the pain to the groin and lateral aspects of his right hip, as well as to the groin region of his left hip. His symptoms are worse with any prolonged standing or ambulation. He also has difficulty reciprocating stairs. He is not ambulating with any assistive devices. Has been taking only Tylenol as necessary for pain with temporary partial relief of his symptoms. He denies any numbness or paresthesias down either leg to either foot, and denies any bowel or bladder complaints. He also denies any back pain at this time.   Past Medical History: Colon polyp 02-02-1950  tubular adenoma  Coronary artery disease August 2020  GERD (gastroesophageal reflux disease) 2018  History of prosthetic aortic valve replacement 09/12/2018  Pericardial valve, 8/19  Hx of migraine headaches  Hypertension  Other and unspecified hyperlipidemia  Post-traumatic osteoarthritis of left shoulder 11/18/2020  Prostate cancer (CMS/HHS-HCC) 09/25/2013  Postop 8/15  Thyroid disease  Tubular adenoma 1950/01/14   Past Surgical History: COLONOSCOPY 1949-07-28  KNEE ARTHROSCOPY 2003  COLONOSCOPY 04/19/2019 (Diverticulosis/PHx CP/Repeat 47yrs/JWB)  OTHER SURGERY 09/2019 (open heart)  Reverse left  total shoulder arthroplasty with biceps tenodesis Left 03/06/2021 (Dr.Athira Janowicz)  Right TKA using all-cemented Biomet Vanguard system with a 75 mm PCR femur, a 79 mm tibial tray with a 10 mm anterior stabilized E-poly insert, and a 37 x 8.6 mm all-poly 3-pegged domed patella. Right 12/23/2021 (Dr.Adarius Tigges)  CARDIAC VALVE REPLACEMENT  CORONARY ARTERY BYPASS GRAFT  TONSILLECTOMY AND ADENOIDECTOMY  VARICOCELE EXCISION   Past Family History: Breast cancer Mother  High blood pressure (Hypertension) Mother  Cancer Mother  Colon polyps Father  in his 60s  Alcohol abuse Father  COPD Father  Emphysema Father  High blood pressure (Hypertension) Other  family hx   Medications: acetaminophen (TYLENOL) 500 MG tablet Take 1,000 mg by mouth every evening  amoxicillin (AMOXIL) 500 MG capsule TAKE 4 CAPSULES BY MOUTH ONE HOUR PRIOR TO DENTAL VISIT  aspirin 81 MG EC tablet Take 81 mg by mouth daily.  atorvastatin (LIPITOR) 40 MG tablet Take 1 tablet (40 mg total) by mouth once daily 90 tablet 3  cyanocobalamin, vitamin B-12, 5,000 mcg Cap Take 5,000 mcg by mouth once daily  ibuprofen (MOTRIN) 200 MG tablet Take 400 mg by mouth once daily  levothyroxine (SYNTHROID) 100 MCG tablet Take 1 tablet (100 mcg total) by mouth once daily Take on an empty stomach with a glass of water at least 30-60 minutes before breakfast. 90 tablet 3  losartan-hydroCHLOROthiazide (HYZAAR) 50-12.5 mg tablet Take 1 tablet by mouth 2 (two) times daily 180 tablet 3  metoprolol tartrate (LOPRESSOR) 25 MG tablet Take 0.5 tablets (12.5 mg total) by mouth 2 (two) times daily 90 tablet 3  MULTIVITAMIN ORAL Take 1 tablet by mouth daily.  pantoprazole (PROTONIX)  20 MG DR tablet Take 1 tablet (20 mg total) by mouth once daily (Patient taking differently: Take 20 mg by mouth 2 (two) times daily before meals) 90 tablet 3  zinc 50 mg Tab Take 1 tablet by mouth daily.   Allergies: POISON IVY - Itching   Review of Systems: A comprehensive 14  point ROS was performed, reviewed, and the pertinent orthopaedic findings are documented in the HPI.  Physical Exam: BP 130/86 (BP Location: Left upper arm, Patient Position: Sitting, BP Cuff Size: Adult)  Ht 175.3 cm (5\' 9" )  Wt 93.2 kg (205 lb 6.4 oz)  BMI 30.33 kg/m   General: Well-developed well-nourished male seen in no acute distress.   HEENT: Atraumatic,normocephalic. Pupils are equal and reactive to light. Oropharynx is clear with moist mucosa  Lungs: Clear to auscultation bilaterally   Cardiovascular: Regular rate and rhythm. Normal S1, S2. No murmurs. No appreciable gallops or rubs. Peripheral pulses are palpable.  Abdomen: Soft, non-tender, nondistended. Bowel sounds present  Right Hip exam: He has moderate pain with right hip flexion and internal rotation, and mild pain with left hip flexion and internal rotation. He exhibits flexion to 95 degrees, internal rotation to 0 degrees, and external rotation to 40 degrees on the right and flexion to 100 degrees, internal rotation to degrees, and external rotation to 45 degrees on the left. He is neurovascularly intact to both lower extremities. He has negative sitting straight leg raises bilaterally.  Neurological: The patient is alert and oriented Sensation to light touch appears to be intact and within normal limits Gross motor strength appeared to be equal to 5/5  Vascular : Peripheral pulses felt to be palpable. Capillary refill appears to be intact and within normal limits  X-ray: X-rays taken on 12/25/2022 demonstrate severe degenerative changes of the right hip as manifest by loss of the superior clear space, osteophyte formation, and subchondral cystic changes. No other lytic lesions or fractures are identified.  Impression: 1. Degenerative arthrosis right hip.  Plan:  The treatment options were discussed with the patient. In addition, patient educational materials were provided regarding the diagnosis and  treatment options. The patient is quite frustrated by his symptoms and functional limitations, especially as they pertain to his right hip, and would like to consider more aggressive treatment options. Therefore, I have recommended a surgical procedure, specifically a right total hip arthroplasty. The procedure was discussed with the patient, as were the potential risks (including bleeding, infection, nerve and/or blood vessel injury, persistent or recurrent pain, loosening and/or failure of the components, dislocation, leg length inequality, need for further surgery, blood clots, strokes, heart attacks and/or arhythmias, pneumonia, etc.) and benefits. The patient states his/her understanding and wishes to proceed. All of the patient's questions and concerns were answered. He can call any time with further concerns. He will follow up post-surgery, routine.    H&P reviewed and patient re-examined. No changes.

## 2023-01-26 NOTE — Anesthesia Preprocedure Evaluation (Signed)
Anesthesia Evaluation  Patient identified by MRN, date of birth, ID band Patient awake    Reviewed: Allergy & Precautions, NPO status , Patient's Chart, lab work & pertinent test results  History of Anesthesia Complications Negative for: history of anesthetic complications  Airway Mallampati: II  TM Distance: >3 FB Neck ROM: Full    Dental no notable dental hx. (+) Teeth Intact   Pulmonary neg pulmonary ROS, neg sleep apnea, neg COPD, Patient abstained from smoking.Not current smoker, former smoker   Pulmonary exam normal breath sounds clear to auscultation       Cardiovascular Exercise Tolerance: Good METShypertension, Pt. on medications + CAD and + CABG  (-) Past MI and (-) CHF (-) dysrhythmias + Valvular Problems/Murmurs (s/p aortic valve replacement, s/p thoracic aneursym repair)  Rhythm:Regular Rate:Normal - Systolic murmurs S/p aortic valve repair and aortic aneurysm repair Deemed by cardiology to be at "acceptable" risk for procedure without further workup   Neuro/Psych  Headaches, neg Seizures  negative psych ROS   GI/Hepatic Neg liver ROS,neg GERD  ,,  Endo/Other  neg diabetesHypothyroidism    Renal/GU negative Renal ROS     Musculoskeletal  (+) Arthritis ,    Abdominal   Peds  Hematology   Anesthesia Other Findings Past Medical History: 2014: Aortic stenosis     Comment:  08-2017  severe;   s/p  avr  No date: Arthritis     Comment:  back and joints No date: Cardiac murmur cardiologist-- dr [name]: Coronary artery disease     Comment:  10-01-2017  s/p  cabg x2 No date: ED (erectile dysfunction) No date: Headache No date: Hyperlipidemia No date: Hypertension No date: Hypothyroid     Comment:  followed by pcp No date: Nocturia No date: Pneumonia urologist-- dr Berneice Heinrich: Primary prostate cancer with metastasis from  prostate to other site [facility])     Comment:  first dx 08-01-2013;  Stage T3a, gleason  3+4;  s/p                radical prostatectomy w/ node dissection 10-20-2013  and               completed post surgery IMRT  04-19-2014 10/01/2017: S/P aortic valve replacement     Comment:  edwards pericardial valve 10/01/2017: S/P CABG x 2     Comment:  LIMA to Diagonal;   SVG to OM 03/05/2014 through 04/19/2014: S/P radiation therapy     Comment:  Prostate bed 6600 cGy in 33 sessions, pelvic lymph nodes              5000 445 cGy in 33 sessions  No date: Wears glasses  Reproductive/Obstetrics                             Anesthesia Physical Anesthesia Plan  ASA: 3  Anesthesia Plan: Spinal   Post-op Pain Management: Ofirmev IV (intra-op)*   Induction: Intravenous  PONV Risk Score and Plan: 1 and Ondansetron, Dexamethasone, Propofol infusion, TIVA and Midazolam  Airway Management Planned: Natural Airway  Additional Equipment: None  Intra-op Plan:   Post-operative Plan:   Informed Consent: I have reviewed the patients History and Physical, chart, labs and discussed the procedure including the risks, benefits and alternatives for the proposed anesthesia with the patient or authorized representative who has indicated his/her understanding and acceptance.       Plan Discussed with: CRNA and Surgeon  Anesthesia Plan Comments: (Discussed  R/B/A of neuraxial anesthesia technique with patient: - rare risks of spinal/epidural hematoma, nerve damage, infection - Risk of PDPH - Risk of nausea and vomiting - Risk of conversion to general anesthesia and its associated risks, including sore throat, damage to lips/eyes/teeth/oropharynx, and rare risks such as cardiac and respiratory events. - Risk of allergic reactions  Discussed the role of CRNA in patient's perioperative care.  Patient voiced understanding.)        Anesthesia Quick Evaluation

## 2023-01-27 ENCOUNTER — Encounter: Payer: Self-pay | Admitting: Surgery

## 2023-01-27 NOTE — Anesthesia Postprocedure Evaluation (Signed)
Anesthesia Post Note  Patient: Danny Odom  Procedure(s) Performed: TOTAL HIP ARTHROPLASTY (Right: Hip)  Patient location during evaluation: PACU Anesthesia Type: Spinal Level of consciousness: oriented and awake and alert Pain management: pain level controlled Vital Signs Assessment: post-procedure vital signs reviewed and stable Respiratory status: spontaneous breathing, respiratory function stable and patient connected to nasal cannula oxygen Cardiovascular status: blood pressure returned to baseline and stable Postop Assessment: no headache, no backache and no apparent nausea or vomiting Anesthetic complications: no   No notable events documented.   Last Vitals:  Vitals:   01/26/23 1345 01/26/23 1429  BP: 127/80 (!) 140/69  Pulse: 72 74  Resp: 13 16  Temp:  (!) 36.1 C  SpO2: 96% 95%    Last Pain:  Vitals:   01/27/23 0902  TempSrc:   PainSc: 0-No pain                 Corinda Gubler

## 2023-03-15 ENCOUNTER — Other Ambulatory Visit: Payer: Self-pay | Admitting: *Deleted

## 2023-03-15 DIAGNOSIS — I712 Thoracic aortic aneurysm, without rupture, unspecified: Secondary | ICD-10-CM

## 2023-03-28 DIAGNOSIS — J111 Influenza due to unidentified influenza virus with other respiratory manifestations: Secondary | ICD-10-CM

## 2023-03-28 HISTORY — DX: Influenza due to unidentified influenza virus with other respiratory manifestations: J11.1

## 2023-03-31 ENCOUNTER — Inpatient Hospital Stay: Admission: RE | Admit: 2023-03-31 | Payer: PRIVATE HEALTH INSURANCE | Source: Ambulatory Visit

## 2023-03-31 ENCOUNTER — Other Ambulatory Visit: Payer: Self-pay | Admitting: Surgery

## 2023-04-02 ENCOUNTER — Inpatient Hospital Stay: Admission: RE | Admit: 2023-04-02 | Payer: PRIVATE HEALTH INSURANCE | Source: Ambulatory Visit

## 2023-04-05 ENCOUNTER — Ambulatory Visit
Admission: RE | Admit: 2023-04-05 | Discharge: 2023-04-05 | Disposition: A | Discharge status: Home / Self Care | Payer: Medicare Other | Source: Ambulatory Visit | Attending: Cardiology | Admitting: Cardiology

## 2023-04-05 ENCOUNTER — Encounter: Payer: Self-pay | Admitting: Anesthesiology

## 2023-04-05 ENCOUNTER — Encounter
Admission: RE | Admit: 2023-04-05 | Discharge: 2023-04-05 | Disposition: A | Discharge status: Home / Self Care | Payer: Medicare Other | Source: Ambulatory Visit | Attending: Surgery | Admitting: Surgery

## 2023-04-05 ENCOUNTER — Inpatient Hospital Stay: Admission: RE | Admit: 2023-04-05 | Payer: PRIVATE HEALTH INSURANCE | Source: Ambulatory Visit

## 2023-04-05 DIAGNOSIS — Z01818 Encounter for other preprocedural examination: Secondary | ICD-10-CM

## 2023-04-05 DIAGNOSIS — I712 Thoracic aortic aneurysm, without rupture, unspecified: Secondary | ICD-10-CM

## 2023-04-05 MED ORDER — IOPAMIDOL (ISOVUE-370) INJECTION 76%
60.0000 mL | Freq: Once | INTRAVENOUS | Status: AC | PRN
  Administered 2023-04-05: 60 mL via INTRAVENOUS

## 2023-04-05 NOTE — Patient Instructions (Signed)
 Your procedure is scheduled on:04-06-23 Tuesday Report to the Registration Desk on the 1st floor of the [facility].Then proceed to the 2nd floor Surgery Desk To find out your arrival time, please call [phone] between 1PM - 3PM on:04-05-23 Monday If your arrival time is 6:00 am, do not arrive before that time as the Medical Mall entrance doors do not open until 6:00 am.  REMEMBER: Instructions that are not followed completely may result in serious medical risk, up to and including death; or upon the discretion of your surgeon and anesthesiologist your surgery may need to be rescheduled.  Do not eat food after midnight the night before surgery.  No gum chewing or hard candies.  You may however, drink CLEAR liquids up to 2 hours before you are scheduled to arrive for your surgery. Do not drink anything within 2 hours of your scheduled arrival time.  Clear liquids include: - water  - apple juice without pulp - gatorade (not RED colors) - black coffee or tea (Do NOT add milk or creamers to the coffee or tea) Do NOT drink anything that is not on this list  In addition, your doctor has ordered for you to drink the provided:  Ensure Pre-Surgery Clear Carbohydrate Drink Drinking this carbohydrate drink up to two hours before surgery helps to reduce insulin resistance and improve patient outcomes. Please complete drinking 2 hours before scheduled arrival time.  One week prior to surgery:Stop NOW (04-05-23) Stop Anti-inflammatories (NSAIDS) such as Advil, Aleve, Ibuprofen, Motrin, Naproxen, Naprosyn and Aspirin based products such as Excedrin, Goody's Powder, BC Powder. Stop ANY OVER THE COUNTER supplements until after surgery (Vitamin B12, Multivitamin, Zinc)  You may however, continue to take Tylenol if needed for pain up until the day of surgery  Continue taking all of your other prescription medications up until the day of surgery.  ON THE DAY OF SURGERY ONLY TAKE THESE MEDICATIONS  WITH SIPS OF WATER: -levothyroxine (SYNTHROID)  -metoprolol tartrate (LOPRESSOR)  -pantoprazole (PROTONIX)   Continue your 81 mg Aspirin up until the day prior to surgery-Do NOT take the morning of surgery  No Alcohol for 24 hours before or after surgery.  No Smoking including e-cigarettes for 24 hours before surgery.  No chewable tobacco products for at least 6 hours before surgery.  No nicotine patches on the day of surgery.  Do not use any "recreational" drugs for at least a week (preferably 2 weeks) before your surgery.  Please be advised that the combination of cocaine and anesthesia may have negative outcomes, up to and including death. If you test positive for cocaine, your surgery will be cancelled.  On the morning of surgery brush your teeth with toothpaste and water, you may rinse your mouth with mouthwash if you wish. Do not swallow any toothpaste or mouthwash.  Use CHG Soap as directed on instruction sheet.  Do not wear jewelry, make-up, hairpins, clips or nail polish.  For welded (permanent) jewelry: bracelets, anklets, waist bands, etc.  Please have this removed prior to surgery.  If it is not removed, there is a chance that hospital personnel will need to cut it off on the day of surgery.  Do not wear lotions, powders, or perfumes.   Do not shave body hair from the neck down 48 hours before surgery.  Contact lenses, hearing aids and dentures may not be worn into surgery.  Do not bring valuables to the hospital. [facility] is not responsible for any missing/lost belongings or valuables.  Notify your doctor if there is any change in your medical condition (cold, fever, infection).  Wear comfortable clothing (specific to your surgery type) to the hospital.  After surgery, you can help prevent lung complications by doing breathing exercises.  Take deep breaths and cough every 1-2 hours. Your doctor may order a device called an Incentive Spirometer to help you take  deep breaths. When coughing or sneezing, hold a pillow firmly against your incision with both hands. This is called "splinting." Doing this helps protect your incision. It also decreases belly discomfort.  If you are being admitted to the hospital overnight, leave your suitcase in the car. After surgery it may be brought to your room.  In case of increased patient census, it may be necessary for you, the patient, to continue your postoperative care in the Same Day Surgery department.  If you are being discharged the day of surgery, you will not be allowed to drive home. You will need a responsible individual to drive you home and stay with you for 24 hours after surgery.   If you are taking public transportation, you will need to have a responsible individual with you.  Please call the Pre-admissions Testing Dept. at [phone] if you have any questions about these instructions.  Surgery Visitation Policy:  Patients having surgery or a procedure may have two visitors.  Children under the age of 33 must have an adult with them who is not the patient.  Temporary Visitor Restrictions Due to increasing cases of flu, RSV and COVID-19: Children ages 73 and under will not be able to visit patients in [facility] hospitals under most circumstances.     Preparing for Surgery with CHLORHEXIDINE GLUCONATE (CHG) Soap  Chlorhexidine Gluconate (CHG) Soap  o An antiseptic cleaner that kills germs and bonds with the skin to continue killing germs even after washing  o Used for showering the night before surgery and morning of surgery  Before surgery, you can play an important role by reducing the number of germs on your skin.  CHG (Chlorhexidine gluconate) soap is an antiseptic cleanser which kills germs and bonds with the skin to continue killing germs even after washing.  Please do not use if you have an allergy to CHG or antibacterial soaps. If your skin becomes reddened/irritated stop  using the CHG.  1. Shower the NIGHT BEFORE SURGERY and the MORNING OF SURGERY with CHG soap.  2. If you choose to wash your hair, wash your hair first as usual with your normal shampoo.  3. After shampooing, rinse your hair and body thoroughly to remove the shampoo.  4. Use CHG as you would any other liquid soap. You can apply CHG directly to the skin and wash gently with a scrungie or a clean washcloth.  5. Apply the CHG soap to your body only from the neck down. Do not use on open wounds or open sores. Avoid contact with your eyes, ears, mouth, and genitals (private parts). Wash face and genitals (private parts) with your normal soap.  6. Wash thoroughly, paying special attention to the area where your surgery will be performed.  7. Thoroughly rinse your body with warm water.  8. Do not shower/wash with your normal soap after using and rinsing off the CHG soap.  9. Pat yourself dry with a clean towel.  10. Wear clean pajamas to bed the night before surgery.  12. Place clean sheets on your bed the night of your first shower and do  not sleep with pets.  13. Shower again with the CHG soap on the day of surgery prior to arriving at the hospital.  14. Do not apply any deodorants/lotions/powders.  15. Please wear clean clothes to the hospital.  How to Use an Incentive Spirometer An incentive spirometer is a tool that measures how well you are filling your lungs with each breath. Learning to take long, deep breaths using this tool can help you keep your lungs clear and active. This may help to reverse or lessen your chance of developing breathing (pulmonary) problems, especially infection. You may be asked to use a spirometer: After a surgery. If you have a lung problem or a history of smoking. After a long period of time when you have been unable to move or be active. If the spirometer includes an indicator to show the highest number that you have reached, your health care provider or  respiratory therapist will help you set a goal. Keep a log of your progress as told by your health care provider. What are the risks? Breathing too quickly may cause dizziness or cause you to pass out. Take your time so you do not get dizzy or light-headed. If you are in pain, you may need to take pain medicine before doing incentive spirometry. It is harder to take a deep breath if you are having pain. How to use your incentive spirometer  Sit up on the edge of your bed or on a chair. Hold the incentive spirometer so that it is in an upright position. Before you use the spirometer, breathe out normally. Place the mouthpiece in your mouth. Make sure your lips are closed tightly around it. Breathe in slowly and as deeply as you can through your mouth, causing the piston or the ball to rise toward the top of the chamber. Hold your breath for 3-5 seconds, or for as long as possible. If the spirometer includes a coach indicator, use this to guide you in breathing. Slow down your breathing if the indicator goes above the marked areas. Remove the mouthpiece from your mouth and breathe out normally. The piston or ball will return to the bottom of the chamber. Rest for a few seconds, then repeat the steps 10 or more times. Take your time and take a few normal breaths between deep breaths so that you do not get dizzy or light-headed. Do this every 1-2 hours when you are awake. If the spirometer includes a goal marker to show the highest number you have reached (best effort), use this as a goal to work toward during each repetition. After each set of 10 deep breaths, cough a few times. This will help to make sure that your lungs are clear. If you have an incision on your chest or abdomen from surgery, place a pillow or a rolled-up towel firmly against the incision when you cough. This can help to reduce pain while taking deep breaths and coughing. General tips When you are able to get out of bed: Walk  around often. Continue to take deep breaths and cough in order to clear your lungs. Keep using the incentive spirometer until your health care provider says it is okay to stop using it. If you have been in the hospital, you may be told to keep using the spirometer at home. Contact a health care provider if: You are having difficulty using the spirometer. You have trouble using the spirometer as often as instructed. Your pain medicine is not giving enough relief  for you to use the spirometer as told. You have a fever. Get help right away if: You develop shortness of breath. You develop a cough with bloody mucus from the lungs. You have fluid or blood coming from an incision site after you cough. Summary An incentive spirometer is a tool that can help you learn to take long, deep breaths to keep your lungs clear and active. You may be asked to use a spirometer after a surgery, if you have a lung problem or a history of smoking, or if you have been inactive for a long period of time. Use your incentive spirometer as instructed every 1-2 hours while you are awake. If you have an incision on your chest or abdomen, place a pillow or a rolled-up towel firmly against your incision when you cough. This will help to reduce pain. Get help right away if you have shortness of breath, you cough up bloody mucus, or blood comes from your incision when you cough. This information is not intended to replace advice given to you by your health care provider. Make sure you discuss any questions you have with your health care provider.

## 2023-04-06 ENCOUNTER — Encounter: Admission: RE | Payer: Self-pay | Source: Home / Self Care

## 2023-04-06 ENCOUNTER — Encounter: Payer: Self-pay | Admitting: Urgent Care

## 2023-04-06 ENCOUNTER — Ambulatory Visit: Admission: RE | Admit: 2023-04-06 | Payer: Medicare Other | Source: Home / Self Care | Admitting: Surgery

## 2023-04-06 SURGERY — ARTHROPLASTY, HIP, TOTAL,POSTERIOR APPROACH
Anesthesia: Choice | Site: Hip | Laterality: Left

## 2023-04-26 ENCOUNTER — Other Ambulatory Visit: Payer: Self-pay

## 2023-04-26 ENCOUNTER — Telehealth: Payer: Self-pay | Admitting: *Deleted

## 2023-04-26 ENCOUNTER — Encounter
Admission: RE | Admit: 2023-04-26 | Discharge: 2023-04-26 | Disposition: A | Discharge status: Home / Self Care | Source: Ambulatory Visit | Attending: Surgery | Admitting: Surgery

## 2023-04-26 VITALS — BP 134/78 | HR 65 | Temp 97.9°F | Resp 14 | Ht 70.0 in | Wt 191.0 lb

## 2023-04-26 DIAGNOSIS — I10 Essential (primary) hypertension: Secondary | ICD-10-CM | POA: Insufficient documentation

## 2023-04-26 DIAGNOSIS — Z01818 Encounter for other preprocedural examination: Secondary | ICD-10-CM | POA: Diagnosis not present

## 2023-04-26 DIAGNOSIS — Z952 Presence of prosthetic heart valve: Secondary | ICD-10-CM | POA: Diagnosis not present

## 2023-04-26 DIAGNOSIS — Z01812 Encounter for preprocedural laboratory examination: Secondary | ICD-10-CM

## 2023-04-26 DIAGNOSIS — R9431 Abnormal electrocardiogram [ECG] [EKG]: Secondary | ICD-10-CM | POA: Diagnosis not present

## 2023-04-26 DIAGNOSIS — Z01810 Encounter for preprocedural cardiovascular examination: Secondary | ICD-10-CM

## 2023-04-26 DIAGNOSIS — Z951 Presence of aortocoronary bypass graft: Secondary | ICD-10-CM | POA: Insufficient documentation

## 2023-04-26 HISTORY — DX: Diverticulosis of intestine, part unspecified, without perforation or abscess without bleeding: K57.90

## 2023-04-26 HISTORY — DX: Personal history of other diseases of the digestive system: Z87.19

## 2023-04-26 HISTORY — DX: Unspecified osteoarthritis, unspecified site: M19.90

## 2023-04-26 HISTORY — DX: Polyp of colon: K63.5

## 2023-04-26 LAB — URINALYSIS, ROUTINE W REFLEX MICROSCOPIC
Bilirubin Urine: NEGATIVE
Glucose, UA: NEGATIVE mg/dL
Hgb urine dipstick: NEGATIVE
Ketones, ur: NEGATIVE mg/dL
Leukocytes,Ua: NEGATIVE
Nitrite: NEGATIVE
Protein, ur: 30 mg/dL — AB
Specific Gravity, Urine: 1.021 (ref 1.005–1.030)
pH: 5 (ref 5.0–8.0)

## 2023-04-26 LAB — TYPE AND SCREEN
ABO/RH(D): O POS
Antibody Screen: NEGATIVE

## 2023-04-26 LAB — BASIC METABOLIC PANEL
Anion gap: 11 (ref 5–15)
BUN: 22 mg/dL (ref 8–23)
CO2: 25 mmol/L (ref 22–32)
Calcium: 9.5 mg/dL (ref 8.9–10.3)
Chloride: 107 mmol/L (ref 98–111)
Creatinine, Ser: 1.24 mg/dL (ref 0.61–1.24)
GFR, Estimated: >60 mL/min (ref >60)
Glucose, Bld: 99 mg/dL (ref 70–99)
Potassium: 3.9 mmol/L (ref 3.5–5.1)
Sodium: 143 mmol/L (ref 135–145)

## 2023-04-26 LAB — SURGICAL PCR SCREEN
MRSA, PCR: NEGATIVE
Staphylococcus aureus: POSITIVE — AB

## 2023-04-26 LAB — CBC
HCT: 37.6 % — ABNORMAL LOW (ref 39.0–52.0)
Hemoglobin: 12.2 g/dL — ABNORMAL LOW (ref 13.0–17.0)
MCH: 31.8 pg (ref 26.0–34.0)
MCHC: 32.4 g/dL (ref 30.0–36.0)
MCV: 97.9 fL (ref 80.0–100.0)
Platelets: 191 10*3/uL (ref 150–400)
RBC: 3.84 MIL/uL — ABNORMAL LOW (ref 4.22–5.81)
RDW: 14.3 % (ref 11.5–15.5)
WBC: 5.7 10*3/uL (ref 4.0–10.5)
nRBC: 0 % (ref 0.0–0.2)

## 2023-04-26 NOTE — Telephone Encounter (Signed)
 Pt has been scheduled tele preop appt 04/29/23. Med rec and consent are done.

## 2023-04-26 NOTE — Telephone Encounter (Signed)
   Pre-operative Risk Assessment    Patient Name: Danny Odom  DOB: 10-15-1950 MRN: [id number]   Date of last office visit: 09/15/22 DR. CRENSHAW Date of next office visit: NONE   Request for Surgical Clearance    Procedure:   ARTHROPLASTY, HIP, TOTAL,POSTERIOR APPROACH  Date of Surgery:  Clearance 05/04/23                                Surgeon:  DR. Joice Lofts Surgeon's Group or Practice Name:  [facility] Phone number:  [phone] Fax number:  [phone]   Type of Clearance Requested:   - Medical ; PER REQUEST DR. POGGI STATES PT CAN REMAIN ON ASA 81 PERIOPERATIVE   Type of Anesthesia:  General    Additional requests/questions:    Elpidio Anis   04/26/2023, 8:41 AM

## 2023-04-26 NOTE — Patient Instructions (Addendum)
 Your procedure is scheduled on: Tuesday, March 25 Report to the Registration Desk on the 1st floor of the [facility] To find out your arrival time, please call [phone] between 1PM - 3PM on: Monday, March 24 If your arrival time is 6:00 am, do not arrive before that time as the [facility] entrance doors do not open until 6:00 am.  REMEMBER: Instructions that are not followed completely may result in serious medical risk, up to and including death; or upon the discretion of your surgeon and anesthesiologist your surgery may need to be rescheduled.  Do not eat food after midnight the night before surgery.  No gum chewing or hard candies.  You may however, drink CLEAR liquids up to 2 hours before you are scheduled to arrive for your surgery. Do not drink anything within 2 hours of your scheduled arrival time.  Clear liquids include: - water  - apple juice without pulp - gatorade (not RED colors) - black coffee or tea (Do NOT add milk or creamers to the coffee or tea) Do NOT drink anything that is not on this list.  In addition, your doctor has ordered for you to drink the provided:  Ensure Pre-Surgery Clear Carbohydrate Drink  Drinking this carbohydrate drink up to two hours before surgery helps to reduce insulin resistance and improve patient outcomes. Please complete drinking 2 hours before scheduled arrival time.  One week prior to surgery: starting March 18 Stop Anti-inflammatories (NSAIDS) such as Advil, Aleve, Ibuprofen, Motrin, Naproxen, Naprosyn and Aspirin based products such as Excedrin, Goody's Powder, BC Powder. Stop ANY OVER THE COUNTER supplements until after surgery. Stop vitamin B, multiple vitamins, zinc.  You may however, continue to take Tylenol if needed for pain up until the day of surgery.  Aspirin 81 mg - continue taking up until the day of surgery.  Continue taking all of your other prescription medications up until the day of surgery.  ON THE DAY OF  SURGERY ONLY TAKE THESE MEDICATIONS WITH SIPS OF WATER:  levothyroxine (SYNTHROID)  metoprolol  pantoprazole (PROTONIX) Atorvastatin (Lipitor)  No Alcohol for 24 hours before or after surgery.  No Smoking including e-cigarettes for 24 hours before surgery.  No chewable tobacco products for at least 6 hours before surgery.  No nicotine patches on the day of surgery.  Do not use any "recreational" drugs for at least a week (preferably 2 weeks) before your surgery.  Please be advised that the combination of cocaine and anesthesia may have negative outcomes, up to and including death. If you test positive for cocaine, your surgery will be cancelled.  On the morning of surgery brush your teeth with toothpaste and water, you may rinse your mouth with mouthwash if you wish. Do not swallow any toothpaste or mouthwash.  Use CHG Soap as directed on instruction sheet.  Do not wear jewelry, make-up, hairpins, clips or nail polish.  For welded (permanent) jewelry: bracelets, anklets, waist bands, etc.  Please have this removed prior to surgery.  If it is not removed, there is a chance that hospital personnel will need to cut it off on the day of surgery.  Do not wear lotions, powders, or perfumes.   Do not shave body hair from the neck down 48 hours before surgery.  Contact lenses, hearing aids and dentures may not be worn into surgery.  Do not bring valuables to the hospital. [facility] is not responsible for any missing/lost belongings or valuables.   Notify your doctor if  there is any change in your medical condition (cold, fever, infection).  Wear comfortable clothing (specific to your surgery type) to the hospital.  After surgery, you can help prevent lung complications by doing breathing exercises.  Take deep breaths and cough every 1-2 hours. Your doctor may order a device called an Incentive Spirometer to help you take deep breaths.  If you are being admitted to the hospital  overnight, leave your suitcase in the car. After surgery it may be brought to your room.  In case of increased patient census, it may be necessary for you, the patient, to continue your postoperative care in the Same Day Surgery department.  If you are being discharged the day of surgery, you will not be allowed to drive home. You will need a responsible individual to drive you home and stay with you for 24 hours after surgery.   If you are taking public transportation, you will need to have a responsible individual with you.  Please call the Pre-admissions Testing Dept. at [phone] if you have any questions about these instructions.  Surgery Visitation Policy:  Patients having surgery or a procedure may have two visitors.  Children under the age of 2 must have an adult with them who is not the patient.  Temporary Visitor Restrictions Due to increasing cases of flu, RSV and COVID-19: Children ages 30 and under will not be able to visit patients in [facility] hospitals under most circumstances.  Inpatient Visitation:    Visiting hours are 7 a.m. to 8 p.m. Up to four visitors are allowed at one time in a patient room. The visitors may rotate out with other people during the day.  One visitor age 19 or older may stay with the patient overnight and must be in the room by 8 p.m.     Pre-operative 5 CHG Bath Instructions   You can play a key role in reducing the risk of infection after surgery. Your skin needs to be as free of germs as possible. You can reduce the number of germs on your skin by washing with CHG (chlorhexidine gluconate) soap before surgery. CHG is an antiseptic soap that kills germs and continues to kill germs even after washing.   DO NOT use if you have an allergy to chlorhexidine/CHG or antibacterial soaps. If your skin becomes reddened or irritated, stop using the CHG and notify one of our RNs at [phone].   Please shower with the CHG soap starting 4 days  before surgery using the following schedule:     Please keep in mind the following:  DO NOT shave, including legs and underarms, starting the day of your first shower.   You may shave your face at any point before/day of surgery.  Place clean sheets on your bed the day you start using CHG soap. Use a clean washcloth (not used since being washed) for each shower. DO NOT sleep with pets once you start using the CHG.   CHG Shower Instructions:  If you choose to wash your hair and private area, wash first with your normal shampoo/soap.  After you use shampoo/soap, rinse your hair and body thoroughly to remove shampoo/soap residue.  Turn the water OFF and apply about 3 tablespoons (45 ml) of CHG soap to a CLEAN washcloth.  Apply CHG soap ONLY FROM YOUR NECK DOWN TO YOUR TOES (washing for 3-5 minutes)  DO NOT use CHG soap on face, private areas, open wounds, or sores.  Pay special attention to  the area where your surgery is being performed.  If you are having back surgery, having someone wash your back for you may be helpful. Wait 2 minutes after CHG soap is applied, then you may rinse off the CHG soap.  Pat dry with a clean towel  Put on clean clothes/pajamas   If you choose to wear lotion, please use ONLY the CHG-compatible lotions on the back of this paper.     Additional instructions for the day of surgery: DO NOT APPLY any lotions, deodorants, cologne, or perfumes.   Put on clean/comfortable clothes.  Brush your teeth.  Ask your nurse before applying any prescription medications to the skin.      CHG Compatible Lotions   Aveeno Moisturizing lotion  Cetaphil Moisturizing Cream  Cetaphil Moisturizing Lotion  Clairol Herbal Essence Moisturizing Lotion, Dry Skin  Clairol Herbal Essence Moisturizing Lotion, Extra Dry Skin  Clairol Herbal Essence Moisturizing Lotion, Normal Skin  Curel Age Defying Therapeutic Moisturizing Lotion with Alpha Hydroxy  Curel Extreme Care Body Lotion   Curel Soothing Hands Moisturizing Hand Lotion  Curel Therapeutic Moisturizing Cream, Fragrance-Free  Curel Therapeutic Moisturizing Lotion, Fragrance-Free  Curel Therapeutic Moisturizing Lotion, Original Formula  Eucerin Daily Replenishing Lotion  Eucerin Dry Skin Therapy Plus Alpha Hydroxy Crme  Eucerin Dry Skin Therapy Plus Alpha Hydroxy Lotion  Eucerin Original Crme  Eucerin Original Lotion  Eucerin Plus Crme Eucerin Plus Lotion  Eucerin TriLipid Replenishing Lotion  Keri Anti-Bacterial Hand Lotion  Keri Deep Conditioning Original Lotion Dry Skin Formula Softly Scented  Keri Deep Conditioning Original Lotion, Fragrance Free Sensitive Skin Formula  Keri Lotion Fast Absorbing Fragrance Free Sensitive Skin Formula  Keri Lotion Fast Absorbing Softly Scented Dry Skin Formula  Keri Original Lotion  Keri Skin Renewal Lotion Keri Silky Smooth Lotion  Keri Silky Smooth Sensitive Skin Lotion  Nivea Body Creamy Conditioning Oil  Nivea Body Extra Enriched Lotion  Nivea Body Original Lotion  Nivea Body Sheer Moisturizing Lotion Nivea Crme  Nivea Skin Firming Lotion  NutraDerm 30 Skin Lotion  NutraDerm Skin Lotion  NutraDerm Therapeutic Skin Cream  NutraDerm Therapeutic Skin Lotion  ProShield Protective Hand Cream  Provon moisturizing lotion     Preoperative Educational Videos for Total Hip, Knee and Shoulder Replacements  To better prepare for surgery, please view our videos that explain the physical activity and discharge planning required to have the best surgical recovery at [facility].  [email or web address]  Questions? Call [phone] or email [email or web address]

## 2023-04-26 NOTE — Telephone Encounter (Signed)
-----   Message from Verlee Monte sent at 04/24/2023  7:21 PM EDT ----- Regarding: Request for pre-operative cardiac clearance Request for pre-operative cardiac clearance:  1. What type of surgery is being performed?  ARTHROPLASTY, HIP, TOTAL,POSTERIOR APPROACH  2. When is this surgery scheduled?  05/04/2023  3. Type of clearance being requested (medical, pharmacy, both)? MEDICAL   4. Are there any medications that need to be held prior to surgery? N/A - surgeon has cleared patient to continue their daily LOW DOSE ASPIRIN throughout the perioperative course. Dose will be held on the day of surgery only.   5. Practice name and name of physician performing surgery?  Performing surgeon: Dr. Leron Croak, MD Requesting clearance: Quentin Mulling, FNP-C    6. Anesthesia type (none, local, MAC, general)? GENERAL  7. What is the office phone and fax number?   Phone: [phone] Fax: [phone]  ATTENTION: Unable to create telephone message as per your standard workflow. Directed by [facility] providers to send requests for cardiac clearance to this pool for appropriate distribution to provider covering pre-operative clearances.   Quentin Mulling, MSN, APRN, FNP-C, CEN [facility]  Peri-operative Services Nurse Practitioner Phone: [phone] 04/24/23 7:21 PM

## 2023-04-26 NOTE — Telephone Encounter (Signed)
   Name: Danny Odom  DOB: 09-26-1950  MRN: [id number]  Primary Cardiologist: Olga Millers, MD  Preoperative team, please contact this patient and set up a phone call appointment for further preoperative risk assessment. Please obtain consent and complete medication review. Thank you for your help.  I confirm that guidance regarding antiplatelet and oral anticoagulation therapy has been completed and, if necessary, noted below.  Ideally aspirin should be continued without interruption, however if the bleeding risk is too great, aspirin may be held for 5-7 days prior to surgery. Please resume aspirin post operatively when it is felt to be safe from a bleeding standpoint.   I also confirmed the patient resides in the state of [name]. As per [facility] Medical Board telemedicine laws, the patient must reside in the state in which the provider is licensed.   Rip Harbour, NP 04/26/2023, 8:53 AM [facility]

## 2023-04-26 NOTE — Telephone Encounter (Signed)
  Patient Consent for Virtual Visit        Danny Odom has provided verbal consent on 04/26/2023 for a virtual visit (video or telephone).   CONSENT FOR VIRTUAL VISIT FOR:  Danny Odom  By participating in this virtual visit I agree to the following:  I hereby voluntarily request, consent and authorize [facility] and its employed or contracted physicians, physician assistants, nurse practitioners or other licensed health care professionals (the Practitioner), to provide me with telemedicine health care services (the "Services") as deemed necessary by the treating Practitioner. I acknowledge and consent to receive the Services by the Practitioner via telemedicine. I understand that the telemedicine visit will involve communicating with the Practitioner through live audiovisual communication technology and the disclosure of certain medical information by electronic transmission. I acknowledge that I have been given the opportunity to request an in-person assessment or other available alternative prior to the telemedicine visit and am voluntarily participating in the telemedicine visit.  I understand that I have the right to withhold or withdraw my consent to the use of telemedicine in the course of my care at any time, without affecting my right to future care or treatment, and that the Practitioner or I may terminate the telemedicine visit at any time. I understand that I have the right to inspect all information obtained and/or recorded in the course of the telemedicine visit and may receive copies of available information for a reasonable fee.  I understand that some of the potential risks of receiving the Services via telemedicine include:  Delay or interruption in medical evaluation due to technological equipment failure or disruption; Information transmitted may not be sufficient (e.g. poor resolution of images) to allow for appropriate medical decision making by the Practitioner;  and/or  In rare instances, security protocols could fail, causing a breach of personal health information.  Furthermore, I acknowledge that it is my responsibility to provide information about my medical history, conditions and care that is complete and accurate to the best of my ability. I acknowledge that Practitioner's advice, recommendations, and/or decision may be based on factors not within their control, such as incomplete or inaccurate data provided by me or distortions of diagnostic images or specimens that may result from electronic transmissions. I understand that the practice of medicine is not an exact science and that Practitioner makes no warranties or guarantees regarding treatment outcomes. I acknowledge that a copy of this consent can be made available to me via my patient portal [facility] MyChart), or I can request a printed copy by calling the office of [facility].    I understand that my insurance will be billed for this visit.   I have read or had this consent read to me. I understand the contents of this consent, which adequately explains the benefits and risks of the Services being provided via telemedicine.  I have been provided ample opportunity to ask questions regarding this consent and the Services and have had my questions answered to my satisfaction. I give my informed consent for the services to be provided through the use of telemedicine in my medical care

## 2023-04-29 ENCOUNTER — Encounter: Payer: Self-pay | Admitting: Surgery

## 2023-04-29 ENCOUNTER — Ambulatory Visit: Attending: Cardiovascular Disease

## 2023-04-29 DIAGNOSIS — Z01810 Encounter for preprocedural cardiovascular examination: Secondary | ICD-10-CM | POA: Diagnosis not present

## 2023-04-29 NOTE — Progress Notes (Signed)
 Virtual Visit via Telephone Note   Because of Danny Odom co-morbid illnesses, he is at least at moderate risk for complications without adequate follow up.  This format is felt to be most appropriate for this patient at this time.  Due to technical limitations with video connection (technology), today's appointment will be conducted as an audio only telehealth visit, and Danny Odom verbally agreed to proceed in this manner.   All issues noted in this document were discussed and addressed.  No physical exam could be performed with this format.  Evaluation Performed:  Preoperative cardiovascular risk assessment _____________   Date:  04/29/2023   Patient ID:  Danny Odom, DOB 1950/05/19, MRN [id number] Patient Location:  Home Provider location:   Office  Primary Care Provider:  Danella Penton, MD Primary Cardiologist:  Olga Millers, MD  Chief Complaint / Patient Profile  73 y.o. y/o male with a h/o CAD s/p CABG x 2 in August 2019, thoracic aortic dissection s/p repair in August 2020, aortic valve stenosis s/p tissue AVR in August 2019, prostate cancer who is pending total hip arthroplasty, posterior approach on 05/04/2023 by Dr. Joice Lofts and presents today for telephonic preoperative cardiovascular risk assessment. History of Present Illness   Danny Odom is a 73 y.o. male who presents via audio/video conferencing for a telehealth visit today.  Pt was last seen in cardiology clinic on 09/15/2022 by Dr. [name]. At that time Danny Odom was doing well.  The patient is now pending procedure as outlined above. Since his last visit, he has remained stable from a cardiac standpoint.  He reports that his right hip arthroplasty in December went very well. Per patient's report he is able to achieve greater than 4 METS of activity, his ability to exercise has been hindered by hip pain however he is independent with his ADLs, can climb flights of stairs and is able to perform moderate  household activities. Today he denies chest pain, shortness of breath, lower extremity edema, fatigue, palpitations, melena, hematuria, hemoptysis, diaphoresis, weakness, presyncope, syncope, orthopnea, and PND.  Past Medical History    Past Medical History:  Diagnosis Date   Aortic arch pseudoaneurysm (HCC)    a.) s/p re-do sternotomy 09/22/2018 - 32 mm HemaShield Platnium woven double velour vascular graft   Aortic stenosis    a.) TTE 10/04/2015: mod AS (MPG 21); b.) TTE 10/11/2015: mod AS (MPG 28); c.) TTE 09/22/2016: mod AS (MPG 22); d.) TTE 08/09/2017: mod AS (MPG 30); e.) R/LHC 08/23/2017: mod AS (29.4); f.) s/p AVR (#25 Edwards Resilia Inspiris)   Arthritis    Aspiration pneumonia of both lungs (HCC) 04/2022   aspiration of meds   Cardiac murmur    Colon polyp    Complication of anesthesia    ?cognitive decline   Coronary artery disease 08/23/2017   a.) R/LHC 08/23/2017: EF >65%, LVEDP 23, 95% oD1 with 90% side branch in lateral D1, 45% mLCx, 75% m-dLCx, 10% p-mLAD; b.) s/p 2v CABG 10/01/2017 (LIMA-D1, SVG-OM)   Diverticulosis    ED (erectile dysfunction)    Flu 03/28/2023   GERD (gastroesophageal reflux disease)    Headache    History of 2019 novel coronavirus disease (COVID-19) 2023   History of hiatal hernia    Hyperlipidemia    Hypertension    Hypothyroidism    Nocturia    Osteoarthritis    Pneumonia    Primary prostate cancer with metastasis from prostate to other site [facility]) 08/01/2013  a.) stage T3a, gleason 3+4;  s/p radical prostatectomy w/ node dissection 10/20/2013; b.) s/p adjuvant IMRT  03/05/2014 - 04/19/2014   Renal calculus or stone    right   S/P aortic valve replacement 10/01/2017   a.) #25 Edwards Resilia Inspiris pericardial tissue valve   S/P CABG x 2 10/01/2017   a.) LIMA-diagonal, SVG-OM   S/P radiation therapy 03/05/2014 through 04/19/2014    Prostate bed 6600 cGy in 33 sessions, pelvic lymph nodes 5000 445 cGy in 33 sessions                            Wears glasses    Past Surgical History:  Procedure Laterality Date   AORTIC VALVE REPLACEMENT N/A 10/01/2017   Procedure: AORTIC VALVE REPLACEMENT (AVR);  Surgeon: Alleen Borne, MD;  Location: [facility] OR;  Service: Open Heart Surgery;  Laterality: N/A;   ASCENDING AORTIC ROOT REPLACEMENT N/A 09/21/2018   Procedure: REPAIR OF ASCENDING AORTIC ANEURYSM USING 32 MM HEMASHIELD PLATINUM WOVEN DOUBLE VELOUR VASCULAR GRAFT WITH REATTACHMENT OF PREVIOUS SAPHENOUS VEIN GRAFT ;  Surgeon: Linden Dolin, MD;  Location: [facility] OR;  Service: Open Heart Surgery;  Laterality: N/A;   COLONOSCOPY     COLONOSCOPY WITH PROPOFOL N/A 04/19/2019   Procedure: COLONOSCOPY WITH PROPOFOL;  Surgeon: Earline Mayotte, MD;  Location: [facility] ENDOSCOPY;  Service: Gastroenterology;  Laterality: N/A;   CORONARY ARTERY BYPASS GRAFT N/A 10/01/2017   Procedure: CORONARY ARTERY BYPASS GRAFTING (CABG) x two, using left internal mammary artery and right leg greater saphenous vein harvested endoscopically;  Surgeon: Alleen Borne, MD;  Location: [facility] OR;  Service: Open Heart Surgery;  Laterality: N/A;   CORONARY ARTERY BYPASS GRAFT N/A 09/21/2018   Procedure: OPEN RIGHT SAPHENOUS VEIN HARVEST;  Surgeon: Linden Dolin, MD;  Location: [facility] OR;  Service: Open Heart Surgery;  Laterality: N/A;   KNEE ARTHROSCOPY Right 2017   LYMPHADENECTOMY Bilateral 10/20/2013   Procedure: BILATERAL PELVIC LYMPH NODE DISSECTION;  Surgeon: Sebastian Ache, MD;  Location: [facility] ORS;  Service: Urology;  Laterality: Bilateral;   ORCHIECTOMY Bilateral 08/26/2018   Procedure: ORCHIECTOMY;  Surgeon: Sebastian Ache, MD;  Location[facility];  Service: Urology;  Laterality: Bilateral;   REVERSE SHOULDER ARTHROPLASTY Left 03/06/2021   Procedure: REVERSE SHOULDER ARTHROPLASTY;  Surgeon: Christena Flake, MD;  Location: [facility] ORS;  Service: Orthopedics;  Laterality: Left;   RIGHT/LEFT HEART CATH AND CORONARY ANGIOGRAPHY N/A 08/23/2017   Procedure:  RIGHT/LEFT HEART CATH AND CORONARY ANGIOGRAPHY;  Surgeon: Marykay Lex, MD;  Location[facility] LAB;  Service: Cardiovascular;  Laterality: N/A;   ROBOT ASSISTED LAPAROSCOPIC RADICAL PROSTATECTOMY N/A 10/20/2013   Procedure: ROBOTIC ASSISTED LAPAROSCOPIC RADICAL PROSTATECTOMY WITH INDOCYANINE GREEN DYE;  Surgeon: Sebastian Ache, MD;  Location: [facility] ORS;  Service: Urology;  Laterality: N/A;   TEE WITHOUT CARDIOVERSION N/A 10/01/2017   Procedure: TRANSESOPHAGEAL ECHOCARDIOGRAM (TEE);  Surgeon: Alleen Borne, MD;  Location: [facility] OR;  Service: Open Heart Surgery;  Laterality: N/A;   TEE WITHOUT CARDIOVERSION N/A 09/21/2018   Procedure: TRANSESOPHAGEAL ECHOCARDIOGRAM (TEE);  Surgeon: Linden Dolin, MD;  Location: [facility] OR;  Service: Open Heart Surgery;  Laterality: N/A;   TONSILLECTOMY AND ADENOIDECTOMY     child   TOTAL HIP ARTHROPLASTY Right 01/26/2023   Procedure: TOTAL HIP ARTHROPLASTY;  Surgeon: Christena Flake, MD;  Location: [facility] ORS;  Service: Orthopedics;  Laterality: Right;   TOTAL KNEE ARTHROPLASTY Right 12/23/2021  Procedure: TOTAL KNEE ARTHROPLASTY;  Surgeon: Christena Flake, MD;  Location: [facility] ORS;  Service: Orthopedics;  Laterality: Right;   VARICOCELECTOMY  1983   Allergies No Known Allergies Home Medications    Prior to Admission medications   Medication Sig Start Date End Date Taking? Authorizing Provider  acetaminophen (TYLENOL) 500 MG tablet Take 1,000 mg by mouth in the morning and at bedtime.    [provider]  aspirin EC 81 MG tablet Take 81 mg by mouth daily. Swallow whole.    [provider]  atorvastatin (LIPITOR) 40 MG tablet Take 40 mg by mouth daily.    [provider]  Cyanocobalamin (B-12) 5000 MCG CAPS Take 5,000 mcg by mouth daily.    [provider]  levothyroxine (SYNTHROID) 100 MCG tablet Take 100 mcg by mouth daily before breakfast.    [provider]  losartan (COZAAR) 100 MG tablet Take 100 mg by mouth  daily. 01/27/23 01/27/24  [provider]  metoprolol tartrate (LOPRESSOR) 25 MG tablet Take 0.5 tablets (12.5 mg total) by mouth 2 (two) times daily. 09/28/18   Linden Dolin, MD  Multiple Vitamin (MULTIVITAMIN WITH MINERALS) TABS tablet Take 1 tablet by mouth daily with breakfast. Centrum Silver  MEN'S 50+    [provider]  oxyCODONE (ROXICODONE) 5 MG immediate release tablet Take 1-2 tablets (5-10 mg total) by mouth every 4 (four) hours as needed for moderate pain (pain score 4-6) or severe pain (pain score 7-10). Patient not taking: Reported on 04/26/2023 01/26/23   Poggi, Excell Seltzer, MD  pantoprazole (PROTONIX) 20 MG tablet Take 20 mg by mouth every morning.    [provider]  zinc gluconate 50 MG tablet Take 50 mg by mouth at bedtime.     [provider]    Physical Exam   Vital Signs:  Danny Odom does not have vital signs available for review today. Given telephonic nature of communication, physical exam is limited. AAOx3. NAD. Normal affect.  Speech and respirations are unlabored. Accessory Clinical Findings   None Assessment & Plan   1.  Preoperative Cardiovascular Risk Assessment:Total hip arthroplasty, posterior approach on 05/04/2023 by Dr. Joice Lofts  Mr. Schlafer's perioperative risk of a major cardiac event is 0.9% according to the Revised Cardiac Risk Index (RCRI).  Therefore, he is at low risk for perioperative complications. His functional capacity is fair at 5.07 METs according to the Duke Activity Status Index (DASI). Recommendations: According to ACC/AHA guidelines, no further cardiovascular testing needed.  The patient may proceed to surgery at acceptable risk.   Antiplatelet and/or Anticoagulation Recommendations: Ideally aspirin should be continued without interruption, however if the bleeding risk is too great, aspirin may be held for 5-7 days prior to surgery. Please resume aspirin post operatively when it is felt to be safe from a  bleeding standpoint.   The patient was advised that if he develops new symptoms prior to surgery to contact our office to arrange for a follow-up visit, and he verbalized understanding.  A copy of this note will be routed to requesting surgeon.  Time:   Today, I have spent 9 minutes with the patient with telehealth technology discussing medical history, symptoms, and management plan.    Rip Harbour, NP  04/29/2023, 9:17 AM

## 2023-04-29 NOTE — Progress Notes (Signed)
 Perioperative / Anesthesia Services  Pre-Admission Testing Clinical Review / Pre-Operative Anesthesia Consult  Date: 04/29/23  Patient Demographics:  Name: [name] DOB: [birth year] MRN:   [id number]  Planned Surgical Procedure(s):    Case: [id number] Date/Time: 05/04/23 0730   Procedure: ARTHROPLASTY, HIP, TOTAL,POSTERIOR APPROACH (Left: Hip)   Anesthesia type: Choice   Diagnosis: Primary osteoarthritis of left hip [M16.12]   Pre-op diagnosis: Primary osteoarthritis of left hip M16.12   Location: [facility] OR ROOM 02 / [facility]   Surgeons: Christena Flake, MD      NOTE: Available PAT nursing documentation and vital signs have been reviewed. Clinical nursing staff has updated patient's PMH/PSHx, current medication list, and drug allergies/intolerances to ensure comprehensive history available to assist in medical decision making as it pertains to the aforementioned surgical procedure and anticipated anesthetic course. Extensive review of available clinical information personally performed. [facility] PMH and PSHx updated with any diagnoses/procedures that  may have been inadvertently omitted during his intake with the pre-admission testing department's nursing staff.  Clinical Discussion:  Danny Odom is a 73 y.o. male who is submitted for pre-surgical anesthesia review and clearance prior to him undergoing the above procedure. Patient is a Former Smoker (20 pack years; quit 02/1983). Pertinent PMH includes: CAD (s/p CABG), aortic stenosis (s/p AVR), diastolic dysfunction, aortic arch pseudoaneurysm, cardiac murmur, aortic atherosclerosis, HTN, HLD, hypothyroidism, GERD (on daily PPI), hiatal hernia, nephrolithiasis, remote prostate cancer, OA.  Patient is followed by cardiology Danny Som, MD). He was last seen in the cardiology clinic on 09/15/2022; notes reviewed. At the time of his clinic visit, patient doing well overall from a cardiovascular perspective. Patient  denied any chest pain, shortness of breath, PND, orthopnea, palpitations, significant peripheral edema, weakness, fatigue, vertiginous symptoms, or presyncope/syncope. Patient with a past medical history significant for cardiovascular diagnoses. Documented physical exam was grossly benign, providing no evidence of acute exacerbation and/or decompensation of the patient's known cardiovascular conditions.  Patient underwent diagnostic RIGHT/LEFT heart catheterization on 08/23/2017 revealing a normal left ventricular systolic function with an EF of >65%.  LVEDP 23 mmHg.  There was multivessel CAD; 95% ostial D1 with a 90% side branch in lateral D1, 45% mid LCx, 75% mid to distal LCx, and 10% proximal to mid LAD.  Hemodynamics: mean PA = 12 mmHg, mean PCWP = 7 mmHg, AO saturation = 92%, PA saturation = 69%, CO = 6.14 L/min, and CI = 2.95 L/min/m.  Given the complexity of his disease, patient was not felt to be amenable to PCI.  He was referred to CVTS for consultation regarding revascularization.  Patient underwent three-vessel CABG procedure on 10/01/2017.  LIMA-D1 and SVG-OM bypass grafts were placed.  Patient with a history of significant aortic valve stenosis.  Transvalvular pressure gradients and valvular area has been monitored since diagnosis.  TTE on 08/09/2017 revealed progression of patient's aortic valve stenosis.  Transvalvular gradient at that time was 30 mmHg with an aortic valve area (VTI) of 1.4 cm.  Patient ultimately underwent AVR on 10/01/2017 placing a #25 Edwards Resilia Inspiris pericardial tissue valve. Procedure was performed concurrently with his coronary revascularization.   Repeat TTE was performed on 09/20/2018 revealing a hyperdynamic ventricular systolic function with an EF of >65%.  There were no regional wall motion abnormalities.  Right ventricular size and function normal.  Left atrium mildly dilated.  There was mild calcification of the mitral valve leaflets, however no  stenosis noted.  Bioprosthetic aortic valve noted to be  and a well-seated position and functioning properly. No residual stenosis noted.  Severe aortic root dilatation with suggestion of dissection flap seen in the ascending root.  Patient was seen in consult by CVTS.  Patient had developed an aortic arch pseudoaneurysm.  He was taken back to the OR for a redo sternotomy and repair on 09/22/2018;  32 mm Hemashield platinum woven double velour vascular graft was placed.  Most recent TTE performed on 05/22/2022 revealed a normal left ventricular systolic function with an EF of >55%. There were no regional wall motion abnormalities.  GLS -20.2%.  Left ventricular diastolic Doppler parameters consistent with abnormal relaxation (G1DD). There was mild biatrial enlargement. Right ventricular size was enlarged with normal function. RVSP = 40.7 mmHg.  There was mild mitral annular calcification.  Bioprosthetic aortic valve well-seated and functioning properly; mean pressure gradient 9.0 mmHg; AVA (VTI) = 1.7 cm.  There was trivial to mild mitral, tricuspid, and pulmonary valve regurgitation. All transvalvular gradients were noted to be normal providing no evidence suggestive of valvular stenosis. Aorta normal in size with no evidence of ectasia or aneurysmal dilatation.  Blood pressure reasonably controlled at 136/82 mmHg on currently prescribed ARB (losartan) and beta-blocker (metoprolol tartrate therapies.  Patient is on atorvastatin for his HLD diagnosis and ASCVD prevention. Patient is not diabetic. He does not have an OSAH diagnosis. Patient is able to complete all of his  ADL/IADLs without cardiovascular limitation.  Per the DASI, patient is able to achieve at least 4 METS of physical activity without experiencing any significant degree of angina/anginal equivalent symptoms. No changes were made to his medication regimen during his visit with cardiology.  Patient scheduled to follow-up with outpatient cardiology  in 1 year  or sooner if needed.  [name] is scheduled for an elective ARTHROPLASTY, HIP, TOTAL,POSTERIOR APPROACH (Left: Hip) on 05/04/2023 with Dr. Leron Croak, MD.  Given patient's past medical history significant for cardiovascular diagnoses, presurgical cardiac clearance was sought by the PAT team. Per cardiology, "total hip arthroplasty, posterior approach on 05/04/2023 by Dr. Joice Lofts. Mr. Ingham perioperative risk of a major cardiac event is 0.9% according to the Revised Cardiac Risk Index (RCRI).  Therefore, he is at low risk for perioperative complications. His functional capacity is fair at 5.07 METs according to the Duke Activity Status Index (DASI). According to ACC/AHA guidelines, no further cardiovascular testing needed.  The patient may proceed to surgery at ACCEPTABLE risk".  In review of the patient's chart, it is noted that he is on daily oral antithrombotic therapy. Given that patient's past medical history is significant for cardiovascular diagnoses, including but not limited to CAD, orthopedics has cleared patient to continue his daily low dose ASA throughout his perioperative course.  Patient has been updated on these directives from his specialty care providers by the PAT team.  Patient reporting previous perioperative complications with anesthesia in the past.  Patient advising that anesthesia has caused (+) "cognitive decline"/ postoperative delirium in the past.  In review his EMR, it is noted that patient underwent a general anesthetic course here at [facility] (ASA III) in 01/2023 without documented complications.      04/26/2023    9:02 AM 01/26/2023    2:29 PM 01/26/2023    1:45 PM  Vitals with BMI  Height 5\' 10"     Weight 191 lbs    BMI 27.41    Systolic 134 140 956  Diastolic 78 69 80  Pulse 65 74 72  Providers/Specialists:  NOTE: Primary physician provider listed below. Patient may have been seen by APP or partner within  same practice.   PROVIDER ROLE / SPECIALTY LAST OV  Poggi, Excell Seltzer, MD Orthopedics (Surgeon) 04/27/2023  Danella Penton, MD Primary Care Provider 04/08/2023  Olga Millers, MD Cardiology 09/15/2022; preop APP call 04/29/2023  Vida Rigger, MD Pulmonary Medicine 12/07/2022   Allergies:  No Known Allergies Current Home Medications:   No current facility-administered medications for this encounter.    acetaminophen (TYLENOL) 500 MG tablet   aspirin EC 81 MG tablet   Cyanocobalamin (B-12) 5000 MCG CAPS   levothyroxine (SYNTHROID) 100 MCG tablet   losartan (COZAAR) 100 MG tablet   metoprolol tartrate (LOPRESSOR) 25 MG tablet   Multiple Vitamin (MULTIVITAMIN WITH MINERALS) TABS tablet   pantoprazole (PROTONIX) 20 MG tablet   zinc gluconate 50 MG tablet   atorvastatin (LIPITOR) 40 MG tablet   oxyCODONE (ROXICODONE) 5 MG immediate release tablet   History:   Past Medical History:  Diagnosis Date   Aortic arch pseudoaneurysm (HCC)    a.) s/p re-do sternotomy 09/22/2018 - 32 mm HemaShield Platnium woven double velour vascular graft   Aortic atherosclerosis (HCC)    Aortic stenosis    a.) TTE 10/04/2015: mod AS (MPG 21); b.) TTE 10/11/2015: mod AS (MPG 28); c.) TTE 09/22/2016: mod AS (MPG 22); d.) TTE 08/09/2017: mod AS (MPG 30); e.) R/LHC 08/23/2017: mod AS (29.4); f.) s/p AVR (#25 Edwards Resilia Inspiris)   Aspiration pneumonia of both lungs (HCC) 04/2022   aspiration of meds   Cardiac murmur    Colon polyp    Complication of anesthesia    ?cognitive decline   Coronary artery disease 08/23/2017   a.) R/LHC 08/23/2017: EF >65%, LVEDP 23, 95% oD1 with 90% side branch in lateral D1, 45% mLCx, 75% m-dLCx, 10% p-mLAD; b.) s/p 2v CABG 10/01/2017 (LIMA-D1, SVG-OM)   Diastolic dysfunction    Diverticulosis    ED (erectile dysfunction)    Flu 03/28/2023   GERD (gastroesophageal reflux disease)    Headache    History of 2019 novel coronavirus disease (COVID-19) 2023   History of  hiatal hernia    Hyperlipidemia    Hypertension    Hypothyroidism    Long-term use of aspirin therapy    Nephrolithiasis    Nocturia    Osteoarthritis    Pneumonia    Primary prostate cancer with metastasis from prostate to other site [facility]) 08/01/2013   a.) stage T3a, gleason 3+4;  s/p radical prostatectomy w/ node dissection 10/20/2013; b.) s/p adjuvant IMRT  03/05/2014 - 04/19/2014   S/P aortic valve replacement 10/01/2017   a.) #25 Edwards Resilia Inspiris pericardial tissue valve   S/P CABG x 2 10/01/2017   a.) LIMA-diagonal, SVG-OM   S/P radiation therapy 03/05/2014 through 04/19/2014    Prostate bed 6600 cGy in 33 sessions, pelvic lymph nodes 5000 445 cGy in 33 sessions                           Wears glasses    Past Surgical History:  Procedure Laterality Date   AORTIC VALVE REPLACEMENT N/A 10/01/2017   Procedure: AORTIC VALVE REPLACEMENT (AVR);  Surgeon: Alleen Borne, MD;  Location: [facility] OR;  Service: Open Heart Surgery;  Laterality: N/A;   ASCENDING AORTIC ROOT REPLACEMENT N/A 09/21/2018   Procedure: REPAIR OF ASCENDING AORTIC ANEURYSM USING 32 MM HEMASHIELD PLATINUM WOVEN DOUBLE VELOUR VASCULAR GRAFT WITH REATTACHMENT  OF PREVIOUS SAPHENOUS VEIN GRAFT ;  Surgeon: Linden Dolin, MD;  Location: [facility] OR;  Service: Open Heart Surgery;  Laterality: N/A;   COLONOSCOPY     COLONOSCOPY WITH PROPOFOL N/A 04/19/2019   Procedure: COLONOSCOPY WITH PROPOFOL;  Surgeon: Earline Mayotte, MD;  Location: [facility] ENDOSCOPY;  Service: Gastroenterology;  Laterality: N/A;   CORONARY ARTERY BYPASS GRAFT N/A 10/01/2017   Procedure: CORONARY ARTERY BYPASS GRAFTING (CABG) x two, using left internal mammary artery and right leg greater saphenous vein harvested endoscopically;  Surgeon: Alleen Borne, MD;  Location: [facility] OR;  Service: Open Heart Surgery;  Laterality: N/A;   CORONARY ARTERY BYPASS GRAFT N/A 09/21/2018   Procedure: OPEN RIGHT SAPHENOUS VEIN HARVEST;  Surgeon: Linden Dolin, MD;   Location: [facility] OR;  Service: Open Heart Surgery;  Laterality: N/A;   KNEE ARTHROSCOPY Right 2017   LYMPHADENECTOMY Bilateral 10/20/2013   Procedure: BILATERAL PELVIC LYMPH NODE DISSECTION;  Surgeon: Sebastian Ache, MD;  Location: [facility] ORS;  Service: Urology;  Laterality: Bilateral;   ORCHIECTOMY Bilateral 08/26/2018   Procedure: ORCHIECTOMY;  Surgeon: Sebastian Ache, MD;  [facility];  Service: Urology;  Laterality: Bilateral;   REVERSE SHOULDER ARTHROPLASTY Left 03/06/2021   Procedure: REVERSE SHOULDER ARTHROPLASTY;  Surgeon: Christena Flake, MD;  Location: [facility] ORS;  Service: Orthopedics;  Laterality: Left;   RIGHT/LEFT HEART CATH AND CORONARY ANGIOGRAPHY N/A 08/23/2017   Procedure: RIGHT/LEFT HEART CATH AND CORONARY ANGIOGRAPHY;  Surgeon: Marykay Lex, MD;  Location: [facility] INVASIVE CV LAB;  Service: Cardiovascular;  Laterality: N/A;   ROBOT ASSISTED LAPAROSCOPIC RADICAL PROSTATECTOMY N/A 10/20/2013   Procedure: ROBOTIC ASSISTED LAPAROSCOPIC RADICAL PROSTATECTOMY WITH INDOCYANINE GREEN DYE;  Surgeon: Sebastian Ache, MD;  Location: [facility] ORS;  Service: Urology;  Laterality: N/A;   TEE WITHOUT CARDIOVERSION N/A 10/01/2017   Procedure: TRANSESOPHAGEAL ECHOCARDIOGRAM (TEE);  Surgeon: Alleen Borne, MD;  Location: [facility] OR;  Service: Open Heart Surgery;  Laterality: N/A;   TEE WITHOUT CARDIOVERSION N/A 09/21/2018   Procedure: TRANSESOPHAGEAL ECHOCARDIOGRAM (TEE);  Surgeon: Linden Dolin, MD;  Location: [facility] OR;  Service: Open Heart Surgery;  Laterality: N/A;   TONSILLECTOMY AND ADENOIDECTOMY     child   TOTAL HIP ARTHROPLASTY Right 01/26/2023   Procedure: TOTAL HIP ARTHROPLASTY;  Surgeon: Christena Flake, MD;  Location: [facility] ORS;  Service: Orthopedics;  Laterality: Right;   TOTAL KNEE ARTHROPLASTY Right 12/23/2021   Procedure: TOTAL KNEE ARTHROPLASTY;  Surgeon: Christena Flake, MD;  Location: [facility] ORS;  Service: Orthopedics;  Laterality: Right;   VARICOCELECTOMY  1983   Family  History  Problem Relation Age of Onset   Emphysema Father    Prostate cancer Father    Breast cancer Mother    Social History   Tobacco Use   Smoking status: Former    Current packs/day: 0.00    Average packs/day: 1 pack/day for 20.0 years (20.0 ttl pk-yrs)    Types: Cigarettes    Start date: 02/15/1963    Quit date: 02/15/1983    Years since quitting: 40.2   Smokeless tobacco: Never  Substance Use Topics   Alcohol use: Not Currently   Pertinent Clinical Results:  LABS:  Hospital Outpatient Visit on 04/26/2023  Component Date Value Ref Range Status   Sodium 04/26/2023 143  135 - 145 mmol/L Final   Potassium 04/26/2023 3.9  3.5 - 5.1 mmol/L Final   Chloride 04/26/2023 107  98 - 111 mmol/L Final   CO2 04/26/2023 25  22 - 32  mmol/L Final   Glucose, Bld 04/26/2023 99  70 - 99 mg/dL Final   Glucose reference range applies only to samples taken after fasting for at least 8 hours.   BUN 04/26/2023 22  8 - 23 mg/dL Final   Creatinine, Ser 04/26/2023 1.24  0.61 - 1.24 mg/dL Final   Calcium 16/11/9602 9.5  8.9 - 10.3 mg/dL Final   GFR, Estimated 04/26/2023 >60  >60 mL/min Final   Comment: (NOTE) Calculated using the CKD-EPI Creatinine Equation (2021)    Anion gap 04/26/2023 11  5 - 15 Final   Performed at [facility], [address] [place], [place] [address]   WBC 04/26/2023 5.7  4.0 - 10.5 K/uL Final   RBC 04/26/2023 3.84 (L)  4.22 - 5.81 MIL/uL Final   Hemoglobin 04/26/2023 12.2 (L)  13.0 - 17.0 g/dL Final   HCT 11/91/4782 37.6 (L)  39.0 - 52.0 % Final   MCV 04/26/2023 97.9  80.0 - 100.0 fL Final   MCH 04/26/2023 31.8  26.0 - 34.0 pg Final   MCHC 04/26/2023 32.4  30.0 - 36.0 g/dL Final   RDW 95/62/1308 14.3  11.5 - 15.5 % Final   Platelets 04/26/2023 191  150 - 400 K/uL Final   nRBC 04/26/2023 0.0  0.0 - 0.2 % Final   Performed at [facility], [address] [place], [place] [address]   ABO/RH(D) 04/26/2023 O POS   Final   Antibody Screen 04/26/2023 NEG    Final   Sample Expiration 04/26/2023 05/10/2023,2359   Final   Extend sample reason 04/26/2023    Final                   Value:NO TRANSFUSIONS OR PREGNANCY IN THE PAST 3 MONTHS Performed at [facility], [address] [place], [place] [address]    Color, Urine 04/26/2023 YELLOW (A)  YELLOW Final   APPearance 04/26/2023 HAZY (A)  CLEAR Final   Specific Gravity, Urine 04/26/2023 1.021  1.005 - 1.030 Final   pH 04/26/2023 5.0  5.0 - 8.0 Final   Glucose, UA 04/26/2023 NEGATIVE  NEGATIVE mg/dL Final   Hgb urine dipstick 04/26/2023 NEGATIVE  NEGATIVE Final   Bilirubin Urine 04/26/2023 NEGATIVE  NEGATIVE Final   Ketones, ur 04/26/2023 NEGATIVE  NEGATIVE mg/dL Final   Protein, ur 52/84/1324 30 (A)  NEGATIVE mg/dL Final   Nitrite 40/11/2723 NEGATIVE  NEGATIVE Final   Leukocytes,Ua 04/26/2023 NEGATIVE  NEGATIVE Final   RBC / HPF 04/26/2023 0-5  0 - 5 RBC/hpf Final   WBC, UA 04/26/2023 0-5  0 - 5 WBC/hpf Final   Bacteria, UA 04/26/2023 RARE (A)  NONE SEEN Final   Squamous Epithelial / HPF 04/26/2023 0-5  0 - 5 /HPF Final   Mucus 04/26/2023 PRESENT   Final   Amorphous Crystal 04/26/2023 PRESENT   Final   Performed at [facility], [address] [place], [place] [address]   MRSA, PCR 04/26/2023 NEGATIVE  NEGATIVE Final   Staphylococcus aureus 04/26/2023 POSITIVE (A)  NEGATIVE Final   Comment: (NOTE) The Xpert SA Assay (FDA approved for NASAL specimens in patients 3 years of age and older), is one component of a comprehensive surveillance program. It is not intended to diagnose infection nor to guide or monitor treatment. Performed at [facility], [address] [place], [place] [address]     ECG: Date: 04/26/2023  Time ECG obtained: 0853 AM Rate: 67  bpm Rhythm:  Sinus rhythm with  short PR Axis (leads I and aVF): normal Intervals: PR 90 ms. QRS 86 ms. QTc 475 ms. ST segment and T wave changes: Evidence of anterolateral ST/T wave  inversions Evidence of a possible, age undetermined, prior infarct:  No Comparison: Prior tracing obtained on 09/15/2022 showed nonspecific T wave abnormalities   IMAGING / PROCEDURES: CT ANGIO CHEST AORTA W &/OR WO CONTRAST performed on 04/05/2023 Status post CABG No evidence of thoracic aortic aneurysm. Centrilobular emphysema with chronic interstitial lung disease. Multiple small reticular and reticulonodular infiltrates within the right middle lobe and lingula with fissural nodules along the left major fissure and right major and minor fissure. With a tree-in-bud appearance along the right lower lobe which is likely postinflammatory changes. 1 cm right kidney stone without hydronephrosis.   DIAGNOSTIC RADIOGRAPHS OF LEFT HIP 2-3 VIEWS performed on 03/08/2023 Advanced degenerative changes as manifest by complete loss of the superior clear space and large femoral osteophytes.   No lytic lesions or fractures are identified.   TRANSTHORACIC ECHOCARDIOGRAM performed on 05/22/2022 Normal left ventricular systolic function with an EF of >55% No LVH No regional wall motion abnormalities Left ventricular diastolic Doppler parameters consistent with abnormal relaxation (G1DD). GLS -20.2% There was mild biatrial enlargement Right ventricular size enlarged with normal systolic function There was mild mitral annular calcification Bioprosthetic aortic valve well-seated with normal functioning; mean pressure gradient = 9 mmHg; AVA (VTI) = 1.7 cm Trivial PR Mild MR and TR RVSP = 40.7 mmHg  RIGHT/LEFT HEART CATHETERIZATION AND CORONARY ANGIOGRAPHY performed on 08/23/2017 Normal left ventricular systolic function with an EF of >65% LVEDP moderately elevated at approximately 23 mmHg Aortic valve stenosis with a mean transvalvular gradient of 29.4 mmHg  Multivessel CAD 95% ostial D1 90% side branch and lateral D1 45% mid LCx  75% mid to distal LCx 10% proximal to mid LAD Hemodynamics: Mean  PA = 12 mmHg Mean PCWP = 7 mmHg AO saturation = 92% PA saturation = 69% CO = 6.14 L/min CI = 2.95 L/min/m.  Recommendations: refer to CVTS   Impression and Plan:  Danny Odom has been referred for pre-anesthesia review and clearance prior to him undergoing the planned anesthetic and procedural courses. Available labs, pertinent testing, and imaging results were personally reviewed by me in preparation for upcoming operative/procedural course. [facility] medical record has been updated following extensive record review and patient interview with PAT staff.   This patient has been appropriately cleared by cardiology with an overall ACCEPTABLE risk of experiencing significant perioperative cardiovascular complications. Based on clinical review performed today (04/29/23), barring any significant acute changes in the patient's overall condition, it is anticipated that he will be able to proceed with the planned surgical intervention. Any acute changes in clinical condition may necessitate his procedure being postponed and/or cancelled. Patient will meet with anesthesia team (MD and/or CRNA) on the day of his procedure for preoperative evaluation/assessment. Questions regarding anesthetic course will be fielded at that time.   Pre-surgical instructions were reviewed with the patient during his PAT appointment, and questions were fielded to satisfaction by PAT clinical staff. He has been instructed on which medications that he will need to hold prior to surgery, as well as the ones that have been deemed safe/appropriate to take on the day of his procedure. As part of the general education provided by PAT, patient made aware both verbally and in writing, that he would need to abstain from the use of any illegal substances during his perioperative course. He was  advised that failure to follow the provided instructions could necessitate case cancellation or result in serious perioperative complications up  to and including death. Patient encouraged to contact PAT and/or his surgeon's office to discuss any questions or concerns that may arise prior to surgery; verbalized understanding.   Quentin Mulling, MSN, APRN, FNP-C, CEN [facility]  Perioperative Services Nurse Practitioner Phone: [phone] Fax: [phone] 04/29/23 10:11 AM  NOTE: This note has been prepared using Dragon dictation software. Despite my best ability to proofread, there is always the potential that unintentional transcriptional errors may still occur from this process.

## 2023-05-03 MED ORDER — CHLORHEXIDINE GLUCONATE 0.12 % MT SOLN
15.0000 mL | Freq: Once | OROMUCOSAL | Status: AC
  Administered 2023-05-04: 15 mL via OROMUCOSAL

## 2023-05-03 MED ORDER — ORAL CARE MOUTH RINSE: 15.0000 mL | Freq: Once | OROMUCOSAL | Status: AC

## 2023-05-03 MED ORDER — LACTATED RINGERS IV SOLN: INTRAVENOUS | Status: DC

## 2023-05-04 ENCOUNTER — Other Ambulatory Visit: Payer: Self-pay

## 2023-05-04 ENCOUNTER — Ambulatory Visit: Payer: Self-pay | Admitting: Urgent Care

## 2023-05-04 ENCOUNTER — Encounter: Payer: Self-pay | Admitting: Surgery

## 2023-05-04 ENCOUNTER — Encounter
Admission: RE | Disposition: A | Discharge status: Home / Self Care | Payer: Self-pay | Source: Ambulatory Visit | Attending: Surgery

## 2023-05-04 ENCOUNTER — Other Ambulatory Visit: Payer: Self-pay | Admitting: Surgery

## 2023-05-04 ENCOUNTER — Ambulatory Visit

## 2023-05-04 ENCOUNTER — Ambulatory Visit
Admission: RE | Admit: 2023-05-04 | Discharge: 2023-05-04 | Disposition: A | Discharge status: Home / Self Care | Source: Ambulatory Visit | Attending: Surgery | Admitting: Surgery

## 2023-05-04 DIAGNOSIS — Z951 Presence of aortocoronary bypass graft: Secondary | ICD-10-CM | POA: Insufficient documentation

## 2023-05-04 DIAGNOSIS — Z923 Personal history of irradiation: Secondary | ICD-10-CM | POA: Diagnosis not present

## 2023-05-04 DIAGNOSIS — Z87891 Personal history of nicotine dependence: Secondary | ICD-10-CM | POA: Insufficient documentation

## 2023-05-04 DIAGNOSIS — I10 Essential (primary) hypertension: Secondary | ICD-10-CM | POA: Diagnosis not present

## 2023-05-04 DIAGNOSIS — M1612 Unilateral primary osteoarthritis, left hip: Secondary | ICD-10-CM | POA: Diagnosis present

## 2023-05-04 DIAGNOSIS — Z8546 Personal history of malignant neoplasm of prostate: Secondary | ICD-10-CM | POA: Diagnosis not present

## 2023-05-04 DIAGNOSIS — I350 Nonrheumatic aortic (valve) stenosis: Secondary | ICD-10-CM | POA: Diagnosis not present

## 2023-05-04 DIAGNOSIS — Z01812 Encounter for preprocedural laboratory examination: Secondary | ICD-10-CM

## 2023-05-04 DIAGNOSIS — I2510 Atherosclerotic heart disease of native coronary artery without angina pectoris: Secondary | ICD-10-CM | POA: Insufficient documentation

## 2023-05-04 DIAGNOSIS — K219 Gastro-esophageal reflux disease without esophagitis: Secondary | ICD-10-CM | POA: Insufficient documentation

## 2023-05-04 DIAGNOSIS — Z952 Presence of prosthetic heart valve: Secondary | ICD-10-CM | POA: Diagnosis not present

## 2023-05-04 DIAGNOSIS — Z01810 Encounter for preprocedural cardiovascular examination: Secondary | ICD-10-CM

## 2023-05-04 HISTORY — DX: Calculus of kidney: N20.0

## 2023-05-04 HISTORY — DX: Atherosclerosis of aorta: I70.0

## 2023-05-04 HISTORY — PX: TOTAL HIP ARTHROPLASTY: SHX124

## 2023-05-04 HISTORY — DX: Other ill-defined heart diseases: I51.89

## 2023-05-04 HISTORY — DX: Long term (current) use of aspirin: Z79.82

## 2023-05-04 SURGERY — ARTHROPLASTY, HIP, TOTAL,POSTERIOR APPROACH
Anesthesia: Spinal | Site: Hip | Laterality: Left

## 2023-05-04 MED ORDER — DEXAMETHASONE SODIUM PHOSPHATE 10 MG/ML IJ SOLN
INTRAMUSCULAR | Status: AC
  Filled 2023-05-04: qty 1

## 2023-05-04 MED ORDER — ACETAMINOPHEN 10 MG/ML IV SOLN
INTRAVENOUS | Status: AC
Start: 2023-05-04 — End: ?
  Filled 2023-05-04: qty 100

## 2023-05-04 MED ORDER — BUPIVACAINE HCL (PF) 0.5 % IJ SOLN
INTRAMUSCULAR | Status: AC
  Filled 2023-05-04: qty 10

## 2023-05-04 MED ORDER — BUPIVACAINE HCL (PF) 0.5 % IJ SOLN
INTRAMUSCULAR | Status: DC | PRN
  Administered 2023-05-04: 3 mL

## 2023-05-04 MED ORDER — ONDANSETRON HCL 4 MG/2ML IJ SOLN
INTRAMUSCULAR | Status: AC
  Filled 2023-05-04: qty 2

## 2023-05-04 MED ORDER — MIDAZOLAM HCL 5 MG/5ML IJ SOLN
INTRAMUSCULAR | Status: DC | PRN
  Administered 2023-05-04: 2 mg via INTRAVENOUS

## 2023-05-04 MED ORDER — ONDANSETRON HCL 4 MG/2ML IJ SOLN
INTRAMUSCULAR | Status: DC | PRN
  Administered 2023-05-04: 4 mg via INTRAVENOUS

## 2023-05-04 MED ORDER — ACETAMINOPHEN 325 MG PO TABS: 325.0000 mg | ORAL_TABLET | Freq: Four times a day (QID) | ORAL | Status: DC | PRN

## 2023-05-04 MED ORDER — FENTANYL CITRATE (PF) 100 MCG/2ML IJ SOLN: 25.0000 ug | INTRAMUSCULAR | Status: DC | PRN

## 2023-05-04 MED ORDER — GLYCOPYRROLATE 0.2 MG/ML IJ SOLN
INTRAMUSCULAR | Status: AC
  Filled 2023-05-04: qty 1

## 2023-05-04 MED ORDER — CEFAZOLIN SODIUM-DEXTROSE 2-4 GM/100ML-% IV SOLN
INTRAVENOUS | Status: AC
  Filled 2023-05-04: qty 100

## 2023-05-04 MED ORDER — CEFAZOLIN SODIUM-DEXTROSE 2-4 GM/100ML-% IV SOLN
2.0000 g | Freq: Four times a day (QID) | INTRAVENOUS | Status: DC
  Administered 2023-05-04: 2 g via INTRAVENOUS

## 2023-05-04 MED ORDER — METOCLOPRAMIDE HCL 5 MG/ML IJ SOLN: 5.0000 mg | Freq: Three times a day (TID) | INTRAMUSCULAR | Status: DC | PRN

## 2023-05-04 MED ORDER — OXYCODONE HCL 5 MG/5ML PO SOLN: 5.0000 mg | Freq: Once | ORAL | Status: DC | PRN

## 2023-05-04 MED ORDER — SODIUM CHLORIDE 0.9 % BOLUS PEDS
250.0000 mL | Freq: Once | INTRAVENOUS | Status: AC
  Administered 2023-05-04: 250 mL via INTRAVENOUS

## 2023-05-04 MED ORDER — STERILE WATER FOR IRRIGATION IR SOLN
Status: DC | PRN
  Administered 2023-05-04: 1000 mL

## 2023-05-04 MED ORDER — BUPIVACAINE LIPOSOME 1.3 % IJ SUSP
INTRAMUSCULAR | Status: AC
  Filled 2023-05-04: qty 20

## 2023-05-04 MED ORDER — GLYCOPYRROLATE 0.2 MG/ML IJ SOLN
INTRAMUSCULAR | Status: DC | PRN
Start: 2023-05-04 — End: 2023-05-04
  Administered 2023-05-04 (×2): .1 mg via INTRAVENOUS

## 2023-05-04 MED ORDER — BUPIVACAINE-EPINEPHRINE (PF) 0.5% -1:200000 IJ SOLN
INTRAMUSCULAR | Status: AC
  Filled 2023-05-04: qty 30

## 2023-05-04 MED ORDER — SODIUM CHLORIDE 0.9 % IR SOLN
Status: DC | PRN
  Administered 2023-05-04: 3000 mL

## 2023-05-04 MED ORDER — METOCLOPRAMIDE HCL 10 MG PO TABS: 5.0000 mg | ORAL_TABLET | Freq: Three times a day (TID) | ORAL | Status: DC | PRN

## 2023-05-04 MED ORDER — SODIUM CHLORIDE 0.9 % IV SOLN: INTRAVENOUS | Status: DC

## 2023-05-04 MED ORDER — KETOROLAC TROMETHAMINE 15 MG/ML IJ SOLN
INTRAMUSCULAR | Status: AC
  Filled 2023-05-04: qty 1

## 2023-05-04 MED ORDER — ONDANSETRON HCL 4 MG PO TABS
4.0000 mg | ORAL_TABLET | Freq: Four times a day (QID) | ORAL | Status: DC | PRN
Start: 2023-05-04 — End: 2023-05-04

## 2023-05-04 MED ORDER — LIDOCAINE HCL (CARDIAC) PF 100 MG/5ML IV SOSY
PREFILLED_SYRINGE | INTRAVENOUS | Status: DC | PRN
  Administered 2023-05-04: 50 mg via INTRAVENOUS

## 2023-05-04 MED ORDER — ONDANSETRON HCL 4 MG/2ML IJ SOLN: 4.0000 mg | Freq: Four times a day (QID) | INTRAMUSCULAR | Status: DC | PRN

## 2023-05-04 MED ORDER — TRIAMCINOLONE ACETONIDE 40 MG/ML IJ SUSP
INTRAMUSCULAR | Status: AC
  Filled 2023-05-04: qty 2

## 2023-05-04 MED ORDER — MUPIROCIN 2 % EX OINT
1.0000 | TOPICAL_OINTMENT | Freq: Two times a day (BID) | CUTANEOUS | 0 refills | Status: AC
Start: 2023-05-04 — End: 2023-06-03

## 2023-05-04 MED ORDER — EPHEDRINE SULFATE-NACL 50-0.9 MG/10ML-% IV SOSY
PREFILLED_SYRINGE | INTRAVENOUS | Status: DC | PRN
  Administered 2023-05-04: 5 mg via INTRAVENOUS

## 2023-05-04 MED ORDER — PHENYLEPHRINE HCL-NACL 20-0.9 MG/250ML-% IV SOLN
INTRAVENOUS | Status: DC | PRN
  Administered 2023-05-04: 20 ug/min via INTRAVENOUS

## 2023-05-04 MED ORDER — TRIAMCINOLONE ACETONIDE 40 MG/ML IJ SUSP
INTRAMUSCULAR | Status: DC | PRN
  Administered 2023-05-04: 93 mL

## 2023-05-04 MED ORDER — CHLORHEXIDINE GLUCONATE 4 % EX SOLN
1.0000 | CUTANEOUS | 1 refills | Status: AC
Start: 2023-05-04 — End: ?

## 2023-05-04 MED ORDER — LIDOCAINE HCL (PF) 2 % IJ SOLN
INTRAMUSCULAR | Status: AC
  Filled 2023-05-04: qty 5

## 2023-05-04 MED ORDER — PROPOFOL 10 MG/ML IV BOLUS
INTRAVENOUS | Status: AC
  Filled 2023-05-04: qty 20

## 2023-05-04 MED ORDER — ACETAMINOPHEN 10 MG/ML IV SOLN
INTRAVENOUS | Status: DC | PRN
  Administered 2023-05-04: 1000 mg via INTRAVENOUS

## 2023-05-04 MED ORDER — FENTANYL CITRATE (PF) 100 MCG/2ML IJ SOLN
INTRAMUSCULAR | Status: DC | PRN
  Administered 2023-05-04 (×4): 25 ug via INTRAVENOUS

## 2023-05-04 MED ORDER — DEXAMETHASONE SODIUM PHOSPHATE 10 MG/ML IJ SOLN
INTRAMUSCULAR | Status: DC | PRN
  Administered 2023-05-04: 10 mg via INTRAVENOUS

## 2023-05-04 MED ORDER — CEFAZOLIN SODIUM-DEXTROSE 2-4 GM/100ML-% IV SOLN
2.0000 g | Freq: Once | INTRAVENOUS | Status: AC
  Administered 2023-05-04: 2 g via INTRAVENOUS

## 2023-05-04 MED ORDER — OXYCODONE HCL 5 MG PO TABS: 5.0000 mg | ORAL_TABLET | Freq: Once | ORAL | Status: DC | PRN

## 2023-05-04 MED ORDER — 0.9 % SODIUM CHLORIDE (POUR BTL) OPTIME
TOPICAL | Status: DC | PRN
  Administered 2023-05-04: 500 mL

## 2023-05-04 MED ORDER — TRANEXAMIC ACID-NACL 1000-0.7 MG/100ML-% IV SOLN
1000.0000 mg | INTRAVENOUS | Status: AC
  Administered 2023-05-04: 1000 mg via INTRAVENOUS

## 2023-05-04 MED ORDER — TRANEXAMIC ACID-NACL 1000-0.7 MG/100ML-% IV SOLN
INTRAVENOUS | Status: AC
Start: 2023-05-04 — End: ?
  Filled 2023-05-04: qty 100

## 2023-05-04 MED ORDER — FENTANYL CITRATE (PF) 100 MCG/2ML IJ SOLN
INTRAMUSCULAR | Status: AC
  Filled 2023-05-04: qty 2

## 2023-05-04 MED ORDER — PROPOFOL 1000 MG/100ML IV EMUL
INTRAVENOUS | Status: AC
  Filled 2023-05-04: qty 100

## 2023-05-04 MED ORDER — SODIUM CHLORIDE (PF) 0.9 % IJ SOLN
INTRAMUSCULAR | Status: AC
Start: 2023-05-04 — End: ?
  Filled 2023-05-04: qty 20

## 2023-05-04 MED ORDER — KETOROLAC TROMETHAMINE 30 MG/ML IJ SOLN
INTRAMUSCULAR | Status: AC
  Filled 2023-05-04: qty 1

## 2023-05-04 MED ORDER — MIDAZOLAM HCL 2 MG/2ML IJ SOLN
INTRAMUSCULAR | Status: AC
  Filled 2023-05-04: qty 2

## 2023-05-04 MED ORDER — PROPOFOL 500 MG/50ML IV EMUL
INTRAVENOUS | Status: DC | PRN
  Administered 2023-05-04: 40 mg via INTRAVENOUS
  Administered 2023-05-04: 20 mg via INTRAVENOUS
  Administered 2023-05-04: 75 ug/kg/min via INTRAVENOUS
  Administered 2023-05-04: 40 mg via INTRAVENOUS

## 2023-05-04 MED ORDER — KETOROLAC TROMETHAMINE 15 MG/ML IJ SOLN
15.0000 mg | Freq: Once | INTRAMUSCULAR | Status: AC
  Administered 2023-05-04: 15 mg via INTRAVENOUS

## 2023-05-04 MED ORDER — OXYCODONE HCL 5 MG PO TABS: 5.0000 mg | ORAL_TABLET | ORAL | Status: DC | PRN

## 2023-05-04 MED ORDER — PHENYLEPHRINE HCL-NACL 20-0.9 MG/250ML-% IV SOLN
INTRAVENOUS | Status: AC
  Filled 2023-05-04: qty 250

## 2023-05-04 SURGICAL SUPPLY — 54 items
ACETAB SHELL 4H 54 F HIP (Shell) ×1 IMPLANT
ADAPTER BIOLOX TAPER OPT +6 (Miscellaneous) IMPLANT
BIT DRILL JC 5IN 2.4M 127 24FL (BIT) IMPLANT
BLADE SAGITTAL WIDE XTHICK NO (BLADE) ×1 IMPLANT
BLADE SURG SZ20 CARB STEEL (BLADE) ×1 IMPLANT
CHLORAPREP W/TINT 26 (MISCELLANEOUS) ×1 IMPLANT
DRAPE IMP U-DRAPE 54X76 (DRAPES) IMPLANT
DRAPE INCISE IOBAN 66X60 STRL (DRAPES) ×1 IMPLANT
DRAPE SHEET LG 3/4 BI-LAMINATE (DRAPES) ×1 IMPLANT
DRAPE SURG 17X11 SM STRL (DRAPES) ×2 IMPLANT
DRSG MEPILEX SACRM 8.7X9.8 (GAUZE/BANDAGES/DRESSINGS) ×1 IMPLANT
DRSG OPSITE POSTOP 4X10 (GAUZE/BANDAGES/DRESSINGS) ×1 IMPLANT
ELECT CAUTERY BLADE 6.4 (BLADE) ×1 IMPLANT
GAUZE 4X4 16PLY ~~LOC~~+RFID DBL (SPONGE) ×1 IMPLANT
GAUZE XEROFORM 1X8 LF (GAUZE/BANDAGES/DRESSINGS) ×1 IMPLANT
GLOVE BIO SURGEON STRL SZ7.5 (GLOVE) ×4 IMPLANT
GLOVE BIO SURGEON STRL SZ8 (GLOVE) ×4 IMPLANT
GLOVE BIOGEL PI IND STRL 8 (GLOVE) ×1 IMPLANT
GLOVE INDICATOR 8.0 STRL GRN (GLOVE) ×1 IMPLANT
GOWN STRL REUS W/ TWL LRG LVL3 (GOWN DISPOSABLE) ×1 IMPLANT
GOWN STRL REUS W/ TWL XL LVL3 (GOWN DISPOSABLE) ×1 IMPLANT
HANDLE YANKAUER SUCT OPEN TIP (MISCELLANEOUS) ×1 IMPLANT
HEAD CERAMIC BIOLOX 36MM (Head) IMPLANT
HOLSTER ELECTROSUGICAL PENCIL (MISCELLANEOUS) ×1 IMPLANT
HOOD PEEL AWAY T7 (MISCELLANEOUS) ×3 IMPLANT
KIT TURNOVER KIT A (KITS) ×1 IMPLANT
LINER ACE G7 36 SZF HIGH WALL (Liner) IMPLANT
MANIFOLD NEPTUNE II (INSTRUMENTS) ×1 IMPLANT
NDL FILTER BLUNT 18X1 1/2 (NEEDLE) ×1 IMPLANT
NDL SAFETY ECLIPSE 18X1.5 (NEEDLE) ×2 IMPLANT
NDL SPNL 20GX3.5 QUINCKE YW (NEEDLE) ×1 IMPLANT
NEEDLE FILTER BLUNT 18X1 1/2 (NEEDLE) ×1 IMPLANT
NEEDLE SPNL 20GX3.5 QUINCKE YW (NEEDLE) ×1 IMPLANT
PACK HIP PROSTHESIS (MISCELLANEOUS) ×1 IMPLANT
PENCIL SMOKE EVACUATOR (MISCELLANEOUS) ×1 IMPLANT
PIN STEIN SMOOTH 3/16X9 (Pin) ×1 IMPLANT
PIN STEIN SMOOTH 4.8X9 (Pin) ×1 IMPLANT
PULSAVAC PLUS IRRIG FAN TIP (DISPOSABLE) ×1 IMPLANT
SHELL ACETAB 54 4H HIP (Shell) IMPLANT
SOL .9 NS 3000ML IRR UROMATIC (IV SOLUTION) ×1 IMPLANT
SPONGE T-LAP 18X18 ~~LOC~~+RFID (SPONGE) ×5 IMPLANT
STAPLER SKIN PROX 35W (STAPLE) ×1 IMPLANT
STEM COLLARLESS FULL 14X150MM (Stem) IMPLANT
SUT TICRON 2-0 30IN 311381 (SUTURE) ×3 IMPLANT
SUT VIC AB 0 CT1 36 (SUTURE) ×1 IMPLANT
SUT VIC AB 1 CT1 36 (SUTURE) ×1 IMPLANT
SUT VIC AB 2-0 CT1 (SUTURE) ×3 IMPLANT
SYR 10ML LL (SYRINGE) ×1 IMPLANT
SYR 20ML LL LF (SYRINGE) ×1 IMPLANT
SYR 30ML LL (SYRINGE) ×2 IMPLANT
TIP FAN IRRIG PULSAVAC PLUS (DISPOSABLE) ×1 IMPLANT
TRAP FLUID SMOKE EVACUATOR (MISCELLANEOUS) ×2 IMPLANT
WATER STERILE IRR 1000ML POUR (IV SOLUTION) ×1 IMPLANT
WATER STERILE IRR 500ML POUR (IV SOLUTION) ×1 IMPLANT

## 2023-05-04 NOTE — Transfer of Care (Signed)
 Immediate Anesthesia Transfer of Care Note  Patient: Danny Odom  Procedure(s) Performed: ARTHROPLASTY, HIP, TOTAL,POSTERIOR APPROACH (Left: Hip)  Patient Location: PACU  Anesthesia Type:General  Level of Consciousness: awake, alert , oriented, and patient cooperative  Airway & Oxygen Therapy: Patient Spontanous Breathing  Post-op Assessment: Report given to RN and Post -op Vital signs reviewed and stable  Post vital signs: Reviewed and stable  Last Vitals:  Vitals Value Taken Time  BP 114/72 05/04/23 1122  Temp    Pulse 72 05/04/23 1126  Resp 20 05/04/23 1126  SpO2 92 % 05/04/23 1126  Vitals shown include unfiled device data.  Last Pain:  Vitals:   05/04/23 0633  TempSrc: Temporal  PainSc: 0-No pain         Complications: No notable events documented.

## 2023-05-04 NOTE — Anesthesia Procedure Notes (Signed)
 Spinal  Patient location during procedure: OR Start time: 05/04/2023 9:16 AM Reason for block: surgical anesthesia Staffing Performed: anesthesiologist  Anesthesiologist: Stephanie Coup, MD Resident/CRNA: Lily Lovings, CRNA Performed by: Lily Lovings, CRNA Authorized by: Stephanie Coup, MD   Preanesthetic Checklist Completed: patient identified, IV checked, site marked, risks and benefits discussed, surgical consent, monitors and equipment checked, pre-op evaluation and timeout performed Spinal Block Patient position: sitting Prep: Betadine Patient monitoring: heart rate, continuous pulse ox, blood pressure and cardiac monitor Approach: midline Location: L3-4 Injection technique: single-shot Needle Needle type: Whitacre and Introducer  Needle gauge: 24 G Needle length: 9 cm Assessment Events: CSF return Additional Notes Negative paresthesia. Negative blood return. Positive free-flowing CSF. Expiration date of kit checked and confirmed. Patient tolerated procedure well, without complications.

## 2023-05-04 NOTE — Anesthesia Procedure Notes (Signed)
 Procedure Name: General with mask airway Date/Time: 05/04/2023 8:50 AM  Performed by: Lily Lovings, CRNAPre-anesthesia Checklist: Patient identified, Emergency Drugs available, Suction available and Patient being monitored Patient Re-evaluated:Patient Re-evaluated prior to induction Oxygen Delivery Method: Simple face mask Preoxygenation: Pre-oxygenation with 100% oxygen Induction Type: IV induction

## 2023-05-04 NOTE — Discharge Instructions (Addendum)
 Orthopedic discharge instructions: May shower with intact OpSite dressing. Apply ice frequently to knee or use Polar Care. Take aspirin 325 mg (or 4 baby aspirin) twice daily for 6 weeks. Take pain medication as prescribed when needed.  May supplement with ES Tylenol if necessary. May weight-bear as tolerated on right leg - use walker for balance and support. Follow-up in 10-14 days or as scheduled.

## 2023-05-04 NOTE — H&P (Signed)
 History of Present Illness: Danny Odom is a 73 y.o. male who presents today for his surgical history and physical for upcoming left total hip arthroplasty. The patient is status post a right total hip arthroplasty and has done extremely well and has been experiencing ongoing chronic left hip pain. Previous x-rays of the left hip do demonstrate significant left hip osteoarthritic changes. At rest he reports a 1 out of 10 pain score, with activity pain score can reach a 5 out of 10 in severity. He is taking Tylenol as needed for discomfort. He has not suffered any recent falls or trauma. The patient denies any personal history of stroke, asthma, COPD, DVT or diabetes. The patient does have a history of a aortic valve replacement, he does take aspirin but is not on any prescription blood thinning medication. The patient did recently undergo preoperative blood work which did reveal signs of a potential urinary tract infection.  Past Medical History: Colon polyp Aug 17, 1949 (tubular adenoma)  Coronary artery disease August 2020  GERD (gastroesophageal reflux disease) 2018  History of prosthetic aortic valve replacement 09/12/2018  Pericardial valve, 8/19  Hx of migraine headaches  Hypertension  Other and unspecified hyperlipidemia  Post-traumatic osteoarthritis of left shoulder 11/18/2020  Prostate cancer (CMS/HHS-HCC) 09/25/2013  Postop 8/15  Thyroid disease  Tubular adenoma 07/23/49   Past Surgical History: COLONOSCOPY 1949/09/15  KNEE ARTHROSCOPY 2003  COLONOSCOPY 04/19/2019 (Diverticulosis/PHx CP/Repeat 64yrs/JWB)  OTHER SURGERY 09/2019 - open heart  Reverse left total shoulder arthroplasty with biceps tenodesis Left 03/06/2021 (Dr.Viriginia Amendola)  Right TKA using all-cemented Biomet V[employer]ard system with a 75 mm PCR femur, a 79 mm tibial tray with a 10 mm anterior stabilized E-poly insert, and a 37 x 8.6 mm all-poly 3-pegged domed patella. Right 12/23/2021 (Dr. Joice Lofts)  CARDIAC VALVE REPLACEMENT   CORONARY ARTERY BYPASS GRAFT  TONSILLECTOMY AND ADENOIDECTOMY  VARICOCELE EXCISION   Past Family History: Breast cancer Mother  High blood pressure (Hypertension) Mother  Cancer Mother  Colon polyps Father (in his 53s)  Alcohol abuse Father  COPD Father  Emphysema Father  High blood pressure (Hypertension) Other - family hx   Medications: acetaminophen (TYLENOL) 500 MG tablet Take 1,000 mg by mouth every evening  amoxicillin (AMOXIL) 500 MG capsule TAKE 4 CAPSULES BY MOUTH ONE HOUR PRIOR TO DENTAL VISIT  aspirin 81 MG EC tablet Take 81 mg by mouth daily.  atorvastatin (LIPITOR) 40 MG tablet Take 1 tablet (40 mg total) by mouth once daily 90 tablet 3  cyanocobalamin, vitamin B-12, 5,000 mcg Cap Take 5,000 mcg by mouth once daily  ibuprofen (MOTRIN) 200 MG tablet Take 400 mg by mouth once daily  levothyroxine (SYNTHROID) 100 MCG tablet Take 1 tablet (100 mcg total) by mouth once daily Take on an empty stomach with a glass of water at least 30-60 minutes before breakfast. 90 tablet 3  losartan (COZAAR) 100 MG tablet Take 1 tablet (100 mg total) by mouth once daily 90 tablet 3  metoprolol tartrate (LOPRESSOR) 25 MG tablet Take 0.5 tablets (12.5 mg total) by mouth 2 (two) times daily 90 tablet 3  MULTIVITAMIN ORAL Take 1 tablet by mouth daily.  nitrofurantoin, macrocrystal-monohydrate, (MACROBID) 100 MG capsule Take 1 capsule (100 mg total) by mouth 2 (two) times daily for 5 days 10 capsule 0  pantoprazole (PROTONIX) 20 MG DR tablet Take 1 tablet (20 mg total) by mouth once daily (Patient taking differently: Take 20 mg by mouth 2 (two) times daily before meals) 90 tablet 3  zinc  50 mg Tab Take 1 tablet by mouth daily.   Allergies: POISON IVY (itching)  Review of Systems:  A comprehensive 14 point ROS was performed, reviewed by me today, and the pertinent orthopaedic findings are documented in the HPI.  Physical Exam: BP 118/70  Ht 175.3 cm (5\' 9" )  Wt 87.5 kg (193 lb)  BMI 28.50  kg/m  General/Constitutional: The patient appears to be well-nourished, well-developed, and in no acute distress. Neuro/Psych: Normal mood and affect, oriented to person, place and time. Eyes: Non-icteric. Pupils are equal, round, and reactive to light, and exhibit synchronous movement. ENT: Unremarkable. Lymphatic: No palpable adenopathy. Respiratory: Lungs clear to auscultation, Normal chest excursion, No wheezes, and Non-labored breathing Cardiovascular: Regular rate and rhythm. No murmurs. and No edema, swelling or tenderness, except as noted in detailed exam. Integumentary: No impressive skin lesions present, except as noted in detailed exam. Musculoskeletal: Unremarkable, except as noted in detailed exam.  Left hip exam: Skin inspection of the left hip is unremarkable. No swelling, erythema, ecchymosis, abrasions, or other skin abnormalities are identified. He has no tenderness palpation over the anterolateral aspects of the hip. Actively, he is able to flex his hip to 90 degrees but has pain with attempted full extension. Passively, he can tolerate hip flexion to 95 degrees, internal rotation to 0 degrees, and external rotation to 25 degrees. He experiences moderate pain at the extremes of forward flexion, internal rotation, and external rotation. He has difficulty arising from a seated position due to left hip pain. In stance, his pelvis is level. He is able to heel raise and toe raise with discomfort, but no lack of strength. He is grossly neurovascular intact to the left lower extremity and foot.  Imaging: X-rays of the pelvis and left hip are obtained. These films demonstrate advanced degenerative changes as manifest by complete loss of the superior clear space and large femoral osteophytes. No lytic lesions or fractures are identified.   Impression: Primary osteoarthritis of left hip.  Plan:  1. Treatment options were discussed today with the patient. 2. The patient is scheduled  for a left total hip arthroplasty with Dr. Joice Lofts on 05/04/2023. 3. The patient was instructed on the risk and benefits of surgical intervention and wishes to proceed at this time. 4. This document will serve as a surgical history and physical for the patient. Macrobid was prescribed for a potential urinary tract infection. 5. The patient will follow-up per standard postop protocol. 6. They can call the clinic they have any questions, new symptoms develop or symptoms worsen.  The procedure was discussed with the patient, as were the potential risks (including bleeding, infection, nerve and/or blood vessel injury, persistent or recurrent pain, failure of the hardware, instability, dislocation, leg length inequality, heterotopic ossification, need for further surgery, blood clots, strokes, heart attacks and/or arhythmias, pneumonia, etc.) and benefits. The patient states his understanding and wishes to proceed.    H&P reviewed and patient re-examined. No changes.

## 2023-05-04 NOTE — Op Note (Addendum)
 05/04/2023  11:31 AM  Patient:   Danny Odom  Pre-Op Diagnosis:   Degenerative joint disease, left hip.  Post-Op Diagnosis:   Same.  Procedure:   Left total hip arthroplasty.  Surgeon:   Maryagnes Amos, MD  Assistant:   Benjamine Mola, ST-C  Anesthesia:   Spinal  Findings:   As above.  Complications:   None  EBL:   100 cc  Fluids:   700 cc crystalloid  UOP:   None  TT:   None  Drains:   None  Closure:   Staples  Implants:   Biomet press-fit system with a # 14 laterally offset Echo femoral stem, a 54 mm acetabular shell with an E-poly hi-wall liner, and a 36 mm ceramic head with a +6 mm neck adapter.  Brief Clinical Note:   The patient is a 73 year old male with a long history of progressively worsening left hip/groin pain.  His symptoms have progressed despite medications, activity modification, etc.  His history and examination are consistent with advanced degenerative joint disease, confirmed by plain radiographs.  The patient presents at this time for a left total hip arthroplasty.   Procedure:   The patient was brought into the operating room. After adequate spinal anesthesia was obtained, the patient was repositioned in the right lateral decubitus position and secured using a lateral hip positioner. The left hip and lower extremity were prepped with ChloroPrep solution before being draped sterilely. Preoperative antibiotics were administered. A timeout was performed to verify the appropriate surgical site.    A standard posterior approach to the hip was made through an approximately 7-8 inch incision. The incision was carried down through the subcutaneous tissues to expose the gluteal fascia and proximal end of the iliotibial band. These structures were split the length of the incision and the Charnley self-retaining hip retractor placed. The bursal tissues were swept posteriorly to expose the short external rotators. The anterior border of the piriformis tendon was  identified and this plane developed down through the capsule to enter the joint. A flap of tissue was elevated off the posterior aspect of the femoral neck and greater trochanter and retracted posteriorly. This flap included the piriformis tendon, the short external rotators, and the posterior capsule. The soft tissues were elevated off the lateral aspect of the ilium and a large Steinmann pin placed bicortically.   With the left leg aligned over the right, a drill bit was placed into the greater trochanter parallel to the Steinmann pin and the distance between these two pins measured in order to optimize leg lengths postoperatively. The drill bit was removed and the hip dislocated. The piriformis fossa was debrided of soft tissues before the intramedullary canal was accessed through this point using a triple step reamer. The canal was reamed sequentially beginning with a #7 tapered reamer and progressing to a # 14 tapered reamer. This provided excellent circumferential chatter. Using the appropriate guide, a femoral neck cut was made 10-12 mm above the lesser trochanter. The femoral head was removed.  Attention was directed to the acetabular side. The labrum was debrided circumferentially before the ligamentum teres was removed using a large curette. A line was drawn on the drapes corresponding to the native version of the acetabulum. This line was used as a guide while the acetabulum was reamed sequentially beginning with a 47 mm reamer and progressing to a 53 mm reamer. This provided excellent circumferential chatter. The 53 mm trial acetabulum was positioned and found to fit  quite well. Therefore, the 54 mm acetabular shell was selected and impacted into place with care taken to maintain the appropriate version. The trial high wall liner was inserted.  Attention was redirected to the femoral side. A box osteotome was used to establish version before the canal was broached sequentially beginning with a #7  broach and progressing to a # 14 broach. This was left in place and several trial reductions performed using both the standard and laterally offset neck options, as well as the -6 mm, +0 mm, +3 mm, and +6 mm neck lengths. After removing the trial components, the "manhole cover" was placed into the apex of the acetabular shell and tightened securely. The permanent E-polyethylene hi-wall liner was impacted into the acetabular shell and its locking mechanism verified using a quarter-inch osteotome. Next, the #14 laterally offset femoral stem was impacted into place with care taken to maintain the appropriate version. A repeat trial reduction was performed using the +3 mm and +6 mm neck lengths. The +6 mm neck length demonstrated excellent stability both in extension and external rotation as well as with flexion to 90 and internal rotation beyond 70. It also was stable in the position of sleep. In addition, leg lengths appeared to be restored appropriately, both by reassessing the position of the right leg over the left, as well as by measuring the distance between the Steinmann pin and the drill bit. The 36 mm ceramic head with the +6 mm neck adapter was impacted onto the stem of the femoral component. The Morse taper locking mechanism was verified using manual distraction before the head was relocated and placed through a range of motion with the findings as described above.  The wound was copiously irrigated with sterile saline solution via the jet lavage system before the peri-incisional and pericapsular tissues were injected with a "cocktail" of 20 cc of Exparel, 30 cc of 0.5% Sensorcaine, 2 cc of Kenalog 40 (80 mg), and 30 mg of Toradol diluted out to 90 cc with normal saline to help with postoperative analgesia. The posterior flap was reapproximated to the posterior aspect of the greater trochanter using #2 Tycron interrupted sutures placed through drill holes. Several additional #2 Tycron interrupted sutures  were used to reinforce this layer of closure. The iliotibial band was reapproximated using #1 Vicryl interrupted sutures before the gluteal fascia was closed using a running #0 Vicryl suture. The subcutaneous tissues were closed in several layers using 2-0 Vicryl interrupted sutures before the skin was closed using staples. A sterile occlusive dressing was applied to the wound. The patient was then rolled back into the supine position on his/her hospital bed before being awakened and returned to the recovery room in satisfactory condition after tolerating the procedure well.

## 2023-05-04 NOTE — Evaluation (Signed)
 Physical Therapy Evaluation Patient Details Name: Danny Odom MRN: [id number] DOB: December 27, 1950 Today's Date: 05/04/2023  History of Present Illness  Patient is a 73 year old male with degenerative joint disease of left hip s/p left total hip arthroplasty. History of right total knee arthroplasty, CABG, shoulder surgery  Clinical Impression  Patient is agreeable to PT evaluation. He has been ambulating with rolling walker or walking stick at home recently. He lives in an apartment with no steps to enter.  Mobility initiated today. Reviewed posterior hip precautions and provided patient with educational handout and home exercise program. The patient walked around the unit with rolling walker with CGA for safety. He has reciprocal gait pattern with mild right knee flexion with stance phase of gait. Stair training completed. Mobility is adequate for discharge home with family support. PT will continue to follow if patient does not discharge as anticipated.       If plan is discharge home, recommend the following: Assist for transportation;Help with stairs or ramp for entrance   Can travel by private vehicle        Equipment Recommendations None recommended by PT  Recommendations for Other Services       Functional Status Assessment Patient has had a recent decline in their functional status and demonstrates the ability to make significant improvements in function in a reasonable and predictable amount of time.     Precautions / Restrictions Precautions Precautions: Posterior Hip Precaution Booklet Issued: Yes (comment) Recall of Precautions/Restrictions: Intact Restrictions Weight Bearing Restrictions Per Provider Order: Yes LLE Weight Bearing Per Provider Order: Weight bearing as tolerated      Mobility  Bed Mobility Overal bed mobility: Needs Assistance Bed Mobility: Supine to Sit, Sit to Supine     Supine to sit: Min assist Sit to supine: Contact guard assist   General  bed mobility comments: cues for technique to maintain hip precuations with mobility. initial assistance for LLE and trunk support    Transfers Overall transfer level: Needs assistance Equipment used: Rolling walker (2 wheels) Transfers: Sit to/from Stand Sit to Stand: Supervision, From elevated surface           General transfer comment: verbal cues for LLE positioning with sitting for comfort and to maintain hip precuations. no physical assistance needed    Ambulation/Gait Ambulation/Gait assistance: Contact guard assist Gait Distance (Feet): 150 Feet Assistive device: Rolling walker (2 wheels) Gait Pattern/deviations: Step-to pattern, Step-through pattern, Decreased stride length, Knee flexed in stance - right Gait velocity: decreased     General Gait Details: right knee mildly flexed in stance phase of gait. no knee buckling with ambulation. reinforcement of gait pattern and using rolling walker for safety with ambulation  Stairs Stairs: Yes Stairs assistance: Contact guard assist Stair Management: Step to pattern, Forwards, Two rails Number of Stairs: 4 General stair comments: reinforcement of correct technique to get up and down 4 steps. occasional tactile cues for LLE positioning. of note, stairs are not required to enter patient's home  Wheelchair Mobility     Tilt Bed    Modified Rankin (Stroke Patients Only)       Balance Overall balance assessment: Needs assistance Sitting-balance support: Feet supported Sitting balance-Leahy Scale: Good     Standing balance support: Bilateral upper extremity supported Standing balance-Leahy Scale: Fair Standing balance comment: using rolling walker for support in standing  Pertinent Vitals/Pain Pain Assessment Pain Assessment: No/denies pain    Home Living Family/patient expects to be discharged to:: Private residence Living Arrangements: Alone Available Help at Discharge:  Family Type of Home: Apartment Home Access: Level entry       Home Layout: One level Home Equipment: Agricultural consultant (2 wheels);BSC/3in1 (walking stick)      Prior Function Prior Level of Function : Independent/Modified Independent;Driving             Mobility Comments: using rolling walker or a walking stick due to pain       Extremity/Trunk Assessment   Upper Extremity Assessment Upper Extremity Assessment: Overall WFL for tasks assessed    Lower Extremity Assessment Lower Extremity Assessment: LLE deficits/detail;RLE deficits/detail RLE Deficits / Details: knee flexed with ambulation (he reports is chronic after TKA). can get to almost straight with quad set while in bed LLE Deficits / Details: 5/5 dorsiflexion LLE Sensation:  (light touch intact. mild numbness reported)       Communication   Communication Communication: No apparent difficulties    Cognition Arousal: Alert Behavior During Therapy: WFL for tasks assessed/performed   PT - Cognitive impairments: No apparent impairments                         Following commands: Intact       Cueing Cueing Techniques: Verbal cues, Visual cues     General Comments General comments (skin integrity, edema, etc.): provided patient with handout with home exercise program as well as posterior hip precuations    Exercises     Assessment/Plan    PT Assessment Patient needs continued PT services  PT Problem List Decreased strength;Decreased range of motion;Decreased activity tolerance;Decreased balance;Decreased mobility       PT Treatment Interventions DME instruction;Stair training;Gait training;Functional mobility training;Therapeutic activities;Therapeutic exercise;Balance training;Neuromuscular re-education;Cognitive remediation;Patient/family education    PT Goals (Current goals can be found in the Care Plan section)  Acute Rehab PT Goals Patient Stated Goal: to go home PT Goal Formulation:  With patient Time For Goal Achievement: 05/18/23 Potential to Achieve Goals: Good    Frequency BID     Co-evaluation               AM-PAC PT "6 Clicks" Mobility  Outcome Measure Help needed turning from your back to your side while in a flat bed without using bedrails?: None Help needed moving from lying on your back to sitting on the side of a flat bed without using bedrails?: A Little Help needed moving to and from a bed to a chair (including a wheelchair)?: A Little Help needed standing up from a chair using your arms (e.g., wheelchair or bedside chair)?: A Little Help needed to walk in hospital room?: A Little Help needed climbing 3-5 steps with a railing? : A Little 6 Click Score: 19    End of Session Equipment Utilized During Treatment: Gait belt Activity Tolerance: Patient tolerated treatment well Patient left: in bed;with call bell/phone within reach;with nursing/sitter in room Nurse Communication: Mobility status PT Visit Diagnosis: Difficulty in walking, not elsewhere classified (R26.2);Other abnormalities of gait and mobility (R26.89)    Time: 1610-9604 PT Time Calculation (min) (ACUTE ONLY): 28 min   Charges:   PT Evaluation $PT Eval Moderate Complexity: 1 Mod PT Treatments $Gait Training: 8-22 mins PT General Charges $$ ACUTE PT VISIT: 1 Visit       Donna Bernard, PT, MPT   Ina Homes 05/04/2023, 2:56  PM

## 2023-05-04 NOTE — Anesthesia Preprocedure Evaluation (Signed)
 Anesthesia Evaluation  Patient identified by MRN, date of birth, ID band Patient awake    Reviewed: Allergy & Precautions, NPO status , Patient's Chart, lab work & pertinent test results  History of Anesthesia Complications Negative for: history of anesthetic complications  Airway Mallampati: II  TM Distance: >3 FB Neck ROM: Full    Dental no notable dental hx. (+) Teeth Intact   Pulmonary neg pulmonary ROS, neg sleep apnea, neg COPD, Patient abstained from smoking.Not current smoker, former smoker   Pulmonary exam normal breath sounds clear to auscultation       Cardiovascular Exercise Tolerance: Good METShypertension, Pt. on medications + CAD and + CABG  (-) Past MI and (-) CHF (-) dysrhythmias + Valvular Problems/Murmurs (s/p aortic valve replacement, s/p thoracic aneursym repair)  Rhythm:Regular Rate:Normal - Systolic murmurs S/p aortic valve repair and aortic aneurysm repair    Neuro/Psych  Headaches, neg Seizures  negative psych ROS   GI/Hepatic Neg liver ROS,neg GERD  ,,  Endo/Other  neg diabetesHypothyroidism    Renal/GU negative Renal ROS     Musculoskeletal  (+) Arthritis ,    Abdominal   Peds  Hematology   Anesthesia Other Findings Past Medical History: 2014: Aortic stenosis     Comment:  08-2017  severe;   s/p  avr  No date: Arthritis     Comment:  back and joints No date: Cardiac murmur cardiologist-- dr [name]: Coronary artery disease     Comment:  10-01-2017  s/p  cabg x2 No date: ED (erectile dysfunction) No date: Headache No date: Hyperlipidemia No date: Hypertension No date: Hypothyroid     Comment:  followed by pcp No date: Nocturia No date: Pneumonia urologist-- dr Berneice Heinrich: Primary prostate cancer with metastasis from  prostate to other site [facility])     Comment:  first dx 08-01-2013;  Stage T3a, gleason 3+4;  s/p                radical prostatectomy w/ node dissection 10-20-2013  and                completed post surgery IMRT  04-19-2014 10/01/2017: S/P aortic valve replacement     Comment:  edwards pericardial valve 10/01/2017: S/P CABG x 2     Comment:  LIMA to Diagonal;   SVG to OM 03/05/2014 through 04/19/2014: S/P radiation therapy     Comment:  Prostate bed 6600 cGy in 33 sessions, pelvic lymph nodes              5000 445 cGy in 33 sessions  No date: Wears glasses  Reproductive/Obstetrics                             Anesthesia Physical Anesthesia Plan  ASA: 3  Anesthesia Plan: Spinal   Post-op Pain Management: Ofirmev IV (intra-op)*   Induction: Intravenous  PONV Risk Score and Plan: 1 and Ondansetron, Dexamethasone, Propofol infusion, TIVA and Midazolam  Airway Management Planned: Natural Airway  Additional Equipment: None  Intra-op Plan:   Post-operative Plan:   Informed Consent: I have reviewed the patients History and Physical, chart, labs and discussed the procedure including the risks, benefits and alternatives for the proposed anesthesia with the patient or authorized representative who has indicated his/her understanding and acceptance.       Plan Discussed with: CRNA and Surgeon  Anesthesia Plan Comments: (Discussed R/B/A of neuraxial anesthesia technique with patient: - rare risks of spinal/epidural  hematoma, nerve damage, infection - Risk of PDPH - Risk of nausea and vomiting - Risk of conversion to general anesthesia and its associated risks, including sore throat, damage to lips/eyes/teeth/oropharynx, and rare risks such as cardiac and respiratory events. - Risk of allergic reactions  Discussed the role of CRNA in patient's perioperative care.  Patient voiced understanding.)        Anesthesia Quick Evaluation

## 2023-05-05 ENCOUNTER — Encounter: Payer: Self-pay | Admitting: Surgery

## 2023-05-05 NOTE — Anesthesia Postprocedure Evaluation (Signed)
 Anesthesia Post Note  Patient: Danny Odom  Procedure(s) Performed: ARTHROPLASTY, HIP, TOTAL,POSTERIOR APPROACH (Left: Hip)  Patient location during evaluation: PACU Anesthesia Type: Spinal Level of consciousness: awake and alert Pain management: pain level controlled Vital Signs Assessment: post-procedure vital signs reviewed and stable Respiratory status: spontaneous breathing, nonlabored ventilation, respiratory function stable and patient connected to nasal cannula oxygen Cardiovascular status: blood pressure returned to baseline and stable Postop Assessment: no apparent nausea or vomiting Anesthetic complications: no  No notable events documented.   Last Vitals:  Vitals:   05/04/23 1227 05/04/23 1456  BP: (!) 152/75 (!) 141/74  Pulse:    Resp:    Temp:    SpO2:      Last Pain:  Vitals:   05/04/23 1226  TempSrc:   PainSc: 0-No pain                 Stephanie Coup

## 2023-05-14 ENCOUNTER — Encounter: Payer: Self-pay | Admitting: Surgery

## 2023-11-11 NOTE — Progress Notes (Signed)
 HPI: FU AVR and CAD. Echocardiogram July 2019 and showed normal LV function, functionally bicuspid aortic valve with moderate aortic stenosis (mean gradient 30 mmHg), moderate aortic insufficiency, mild mitral regurgitation. CTA July 2019 showed 3.8 to 3.9 cm aortic root. Cardiac catheterization July 2019 showed normal LV function, left ventricular end-diastolic pressure 23 mmHg, moderate aortic stenosis, 90% first diagonal and 75% circumflex.  Preoperative carotid Dopplers August 2019 showed no significant stenosis.  Patient had aortic valve replacement with pericardial valve as well as coronary artery bypass and graft with a LIMA to the diagonal and saphenous vein graft to the obtuse marginal in August 2019.  CTA 8/20 showed ascending aortic aneurysm of 5 cm and ascending dissection. Pt subsequently had ascending aortic root replacement 09/21/18.  Echocardiogram at [facility] April 2024 showed normal LV function, mild right ventricular enlargement, mild biatrial enlargement, status post aortic valve replacement with bioprosthetic valve with normal function.  CTA February 2025 showed no thoracic aortic aneurysm; emphysema noted.  Since last seen, patient denies dyspnea, chest pain, palpitations or syncope.  Current Outpatient Medications  Medication Sig Dispense Refill   acetaminophen  (TYLENOL ) 500 MG tablet Take 1,000 mg by mouth in the morning and at bedtime.     aspirin  EC 81 MG tablet Take 81 mg by mouth daily. Swallow whole.     atorvastatin  (LIPITOR) 40 MG tablet Take 40 mg by mouth daily.     Cyanocobalamin (B-12) 5000 MCG CAPS Take 5,000 mcg by mouth daily.     levothyroxine  (SYNTHROID ) 100 MCG tablet Take 100 mcg by mouth daily before breakfast.     losartan  (COZAAR ) 100 MG tablet Take 100 mg by mouth daily.     metoprolol  tartrate (LOPRESSOR ) 25 MG tablet Take 0.5 tablets (12.5 mg total) by mouth 2 (two) times daily. 14 tablet 0   Multiple Vitamin (MULTIVITAMIN WITH MINERALS) TABS  tablet Take 1 tablet by mouth daily with breakfast. Centrum Silver  MEN'S 50+     pantoprazole  (PROTONIX ) 20 MG tablet Take 20 mg by mouth every morning.     zinc  gluconate 50 MG tablet Take 50 mg by mouth at bedtime.      chlorhexidine  (HIBICLENS ) 4 % external liquid Apply 15 mLs (1 Application total) topically as directed for 30 doses. Use as directed daily for 5 days every other week for 6 weeks. 946 mL 1   oxyCODONE  (ROXICODONE ) 5 MG immediate release tablet Take 1-2 tablets (5-10 mg total) by mouth every 4 (four) hours as needed for moderate pain (pain score 4-6) or severe pain (pain score 7-10). (Patient not taking: Reported on 04/26/2023) 30 tablet 0   No current facility-administered medications for this visit.     Past Medical History:  Diagnosis Date   Aortic arch pseudoaneurysm    a.) s/p re-do sternotomy 09/22/2018 - 32 mm HemaShield Platnium woven double velour vascular graft   Aortic atherosclerosis    Aortic stenosis    a.) TTE 10/04/2015: mod AS (MPG 21); b.) TTE 10/11/2015: mod AS (MPG 28); c.) TTE 09/22/2016: mod AS (MPG 22); d.) TTE 08/09/2017: mod AS (MPG 30); e.) R/LHC 08/23/2017: mod AS (29.4); f.) s/p AVR 10/01/2017 --> #25 Edwards Resilia Inspiris   Aspiration pneumonia of both lungs (HCC) 04/2022   aspiration of meds   Cardiac murmur    Colon polyp    Complication of anesthesia    ?cognitive decline   Coronary artery disease 08/23/2017   a.) R/LHC 08/23/2017: EF >65%, LVEDP 23, 95%  oD1 with 90% side branch in lateral D1, 45% mLCx, 75% m-dLCx, 10% p-mLAD; b.) s/p 2v CABG 10/01/2017 (LIMA-D1, SVG-OM)   Diastolic dysfunction    Diverticulosis    ED (erectile dysfunction)    Flu 03/28/2023   GERD (gastroesophageal reflux disease)    Headache    History of 2019 novel coronavirus disease (COVID-19) 2023   History of hiatal hernia    Hyperlipidemia    Hypertension    Hypothyroidism    Long-term use of aspirin  therapy    Nephrolithiasis    Nocturia     Osteoarthritis    Pneumonia    Primary prostate cancer with metastasis from prostate to other site [facility]) 08/01/2013   a.) stage T3a, gleason 3+4;  s/p radical prostatectomy w/ node dissection 10/20/2013; b.) s/p adjuvant IMRT  03/05/2014 - 04/19/2014   S/P aortic valve replacement 10/01/2017   a.) #25 Edwards Resilia Inspiris pericardial tissue valve   S/P CABG x 2 10/01/2017   a.) LIMA-diagonal, SVG-OM   S/P radiation therapy 03/05/2014 through 04/19/2014    Prostate bed 6600 cGy in 33 sessions, pelvic lymph nodes 5000 445 cGy in 33 sessions                           Wears glasses     Past Surgical History:  Procedure Laterality Date   AORTIC VALVE REPLACEMENT N/A 10/01/2017   Procedure: AORTIC VALVE REPLACEMENT (AVR);  Surgeon: Lucas Dorise POUR, MD;  Location[facility] OR;  Service: Open Heart Surgery;  Laterality: N/A;   ASCENDING AORTIC ROOT REPLACEMENT N/A 09/21/2018   Procedure: REPAIR OF ASCENDING AORTIC ANEURYSM USING 32 MM HEMASHIELD PLATINUM WOVEN DOUBLE VELOUR VASCULAR GRAFT WITH REATTACHMENT OF PREVIOUS SAPHENOUS VEIN GRAFT ;  Surgeon: German Bartlett PEDLAR, MD;  Location: [facility] OR;  Service: Open Heart Surgery;  Laterality: N/A;   COLONOSCOPY     COLONOSCOPY WITH PROPOFOL  N/A 04/19/2019   Procedure: COLONOSCOPY WITH PROPOFOL ;  Surgeon: Dessa Reyes ORN, MD;  Location: [facility] ENDOSCOPY;  Service: Gastroenterology;  Laterality: N/A;   CORONARY ARTERY BYPASS GRAFT N/A 10/01/2017   Procedure: CORONARY ARTERY BYPASS GRAFTING (CABG) x two, using left internal mammary artery and right leg greater saphenous vein harvested endoscopically;  Surgeon: Lucas Dorise POUR, MD;  Location: [facility] OR;  Service: Open Heart Surgery;  Laterality: N/A;   CORONARY ARTERY BYPASS GRAFT N/A 09/21/2018   Procedure: OPEN RIGHT SAPHENOUS VEIN HARVEST;  Surgeon: German Bartlett PEDLAR, MD;  Location: [facility] OR;  Service: Open Heart Surgery;  Laterality: N/A;   KNEE ARTHROSCOPY Right 2017   LYMPHADENECTOMY Bilateral 10/20/2013    Procedure: BILATERAL PELVIC LYMPH NODE DISSECTION;  Surgeon: Ricardo Likens, MD;  Location: [facility] ORS;  Service: Urology;  Laterality: Bilateral;   ORCHIECTOMY Bilateral 08/26/2018   Procedure: ORCHIECTOMY;  Surgeon: Likens Ricardo, MD;  Location: [facility];  Service: Urology;  Laterality: Bilateral;   REVERSE SHOULDER ARTHROPLASTY Left 03/06/2021   Procedure: REVERSE SHOULDER ARTHROPLASTY;  Surgeon: Edie Norleen PARAS, MD;  Location: [facility] ORS;  Service: Orthopedics;  Laterality: Left;   RIGHT/LEFT HEART CATH AND CORONARY ANGIOGRAPHY N/A 08/23/2017   Procedure: RIGHT/LEFT HEART CATH AND CORONARY ANGIOGRAPHY;  Surgeon: Anner Alm ORN, MD;  Location: [facility] INVASIVE CV LAB;  Service: Cardiovascular;  Laterality: N/A;   ROBOT ASSISTED LAPAROSCOPIC RADICAL PROSTATECTOMY N/A 10/20/2013   Procedure: ROBOTIC ASSISTED LAPAROSCOPIC RADICAL PROSTATECTOMY WITH INDOCYANINE GREEN DYE;  Surgeon: Ricardo Likens, MD;  Location: [facility] ORS;  Service: Urology;  Laterality:  N/A;   TEE WITHOUT CARDIOVERSION N/A 10/01/2017   Procedure: TRANSESOPHAGEAL ECHOCARDIOGRAM (TEE);  Surgeon: Lucas Dorise POUR, MD;  Location: [facility] OR;  Service: Open Heart Surgery;  Laterality: N/A;   TEE WITHOUT CARDIOVERSION N/A 09/21/2018   Procedure: TRANSESOPHAGEAL ECHOCARDIOGRAM (TEE);  Surgeon: German Bartlett PEDLAR, MD;  Location: [facility] OR;  Service: Open Heart Surgery;  Laterality: N/A;   TONSILLECTOMY AND ADENOIDECTOMY     child   TOTAL HIP ARTHROPLASTY Right 01/26/2023   Procedure: TOTAL HIP ARTHROPLASTY;  Surgeon: Edie Norleen PARAS, MD;  Location: [facility] ORS;  Service: Orthopedics;  Laterality: Right;   TOTAL HIP ARTHROPLASTY Left 05/04/2023   Procedure: ARTHROPLASTY, HIP, TOTAL,POSTERIOR APPROACH;  Surgeon: Edie Norleen PARAS, MD;  Location: [facility] ORS;  Service: Orthopedics;  Laterality: Left;   TOTAL KNEE ARTHROPLASTY Right 12/23/2021   Procedure: TOTAL KNEE ARTHROPLASTY;  Surgeon: Edie Norleen PARAS, MD;  Location: [facility] ORS;  Service: Orthopedics;   Laterality: Right;   VARICOCELECTOMY  1983    Social History   Socioeconomic History   Marital status: Divorced    Spouse name: Not on file   Number of children: 2   Years of education: Not on file   Highest education level: Not on file  Occupational History   Not on file  Tobacco Use   Smoking status: Former    Current packs/day: 0.00    Average packs/day: 1 pack/day for 20.0 years (20.0 ttl pk-yrs)    Types: Cigarettes    Start date: 02/15/1963    Quit date: 02/15/1983    Years since quitting: 40.8   Smokeless tobacco: Never  Vaping Use   Vaping status: Never Used  Substance and Sexual Activity   Alcohol use: Not Currently   Drug use: Never   Sexual activity: Not on file  Other Topics Concern   Not on file  Social History Narrative   Lives alone   Social Drivers of Health   Financial Resource Strain: Low Risk  (10/17/2023)   Received from [facility]   Overall Financial Resource Strain (CARDIA)    Difficulty of Paying Living Expenses: Not very hard  Food Insecurity: No Food Insecurity (10/17/2023)   [facility] from [facility]   Hunger Vital Sign    Within the past 12 months, you worried that your food would run out before you got the money to buy more.: Never true    Within the past 12 months, the food you bought just didn't last and you didn't have money to get more.: Never true  Transportation Needs: No Transportation Needs (10/17/2023)   Received from [facility] - Transportation    In the past 12 months, has lack of transportation kept you from medical appointments or from getting medications?: No    Lack of Transportation (Non-Medical): No  Physical Activity: Not on file  Stress: Not on file  Social Connections: Not on file  Intimate Partner Violence: Unknown (09/16/2017)   Humiliation, Afraid, Rape, and Kick questionnaire    Fear of Current or Ex-Partner: No    Emotionally Abused: Not on file     Physically Abused: Not on file    Sexually Abused: Not on file    Family History  Problem Relation Age of Onset   Emphysema Father    Prostate cancer Father    Breast cancer Mother     ROS: no fevers or chills, productive cough, hemoptysis, dysphasia, odynophagia, melena, hematochezia, dysuria, hematuria, rash, seizure activity, orthopnea,  PND, pedal edema, claudication. Remaining systems are negative.  Physical Exam: Well-developed well-nourished in no acute distress.  Skin is warm and dry.  HEENT is normal.  Neck is supple.  Chest is clear to auscultation with normal expansion.  Cardiovascular exam is regular rate and rhythm.  2/6 systolic murmur left sternal border.  No diastolic murmur noted. Abdominal exam nontender or distended. No masses palpated. Extremities show no edema. neuro grossly intact   A/P  1 status post aortic valve replacement-continue SBE prophylaxis.  2 hypertension-blood pressure controlled.  Continue present medications.  3 hyperlipidemia-continue statin.  Most LDL 70.  We discussed options today.  Will increase Lipitor to 80 mg daily.  Check lipids and liver in 8 weeks with goal LDL less than 55.  4 coronary artery disease status post coronary bypass and graft-continue aspirin  and statin.  He denies chest pain.  5 status post aortic root replacement secondary to aortic aneurysm/dissection-most recent CTA showed stable repair.  Redell Shallow, MD

## 2023-11-25 ENCOUNTER — Ambulatory Visit: Attending: Cardiology | Admitting: Cardiology

## 2023-11-25 ENCOUNTER — Encounter: Payer: Self-pay | Admitting: Cardiology

## 2023-11-25 VITALS — BP 130/70 | HR 69 | Ht 70.0 in | Wt 194.0 lb

## 2023-11-25 DIAGNOSIS — I2510 Atherosclerotic heart disease of native coronary artery without angina pectoris: Secondary | ICD-10-CM | POA: Diagnosis present

## 2023-11-25 DIAGNOSIS — E7800 Pure hypercholesterolemia, unspecified: Secondary | ICD-10-CM | POA: Diagnosis present

## 2023-11-25 DIAGNOSIS — Z952 Presence of prosthetic heart valve: Secondary | ICD-10-CM | POA: Insufficient documentation

## 2023-11-25 DIAGNOSIS — I10 Essential (primary) hypertension: Secondary | ICD-10-CM | POA: Insufficient documentation

## 2023-11-25 MED ORDER — ATORVASTATIN CALCIUM 80 MG PO TABS: 80.0000 mg | ORAL_TABLET | Freq: Every day | ORAL | 3 refills | Status: AC

## 2023-11-25 NOTE — Patient Instructions (Signed)
 Medication Instructions:   INCREASE ATORVASTATIN  TO 80 MG ONCE DAILY= 2 OF THE 40 MG TABLETS ONCE DAILY  *If you need a refill on your cardiac medications before your next appointment, please call your pharmacy*  Lab Work:  Your physician recommends that you return for lab work in: 8 [facility]  If you have labs (blood work) drawn today and your tests are completely normal, you will receive your results only by: MyChart Message (if you have MyChart) OR A paper copy in the mail If you have any lab test that is abnormal or we need to change your treatment, we will call you to review the results.   Follow-Up: At [facility], you and your health needs are our priority.  As part of our continuing mission to provide you with exceptional heart care, our providers are all part of one team.  This team includes your primary Cardiologist (physician) and Advanced Practice Providers or APPs (Physician Assistants and Nurse Practitioners) who all work together to provide you with the care you need, when you need it.  Your next appointment:   12 month(s)  Provider:   Redell Shallow, MD

## 2023-12-04 LAB — LIPID PANEL
Chol/HDL Ratio: 2.9 ratio (ref 0.0–5.0)
Cholesterol, Total: 155 mg/dL (ref 100–199)
HDL: 53 mg/dL (ref >39)
LDL Chol Calc (NIH): 68 mg/dL (ref 0–99)
Triglycerides: 210 mg/dL — ABNORMAL HIGH (ref 0–149)
VLDL Cholesterol Cal: 34 mg/dL (ref 5–40)

## 2023-12-04 LAB — HEPATIC FUNCTION PANEL
ALT: 15 IU/L (ref 0–44)
AST: 17 IU/L (ref 0–40)
Albumin: 4 g/dL (ref 3.8–4.8)
Alkaline Phosphatase: 120 IU/L (ref 47–123)
Bilirubin Total: 0.3 mg/dL (ref 0.0–1.2)
Bilirubin, Direct: 0.09 mg/dL (ref 0.00–0.40)
Total Protein: 6.3 g/dL (ref 6.0–8.5)

## 2023-12-05 ENCOUNTER — Ambulatory Visit: Payer: Self-pay | Admitting: Cardiology
